# Patient Record
Sex: Female | Born: 1985 | Race: Black or African American | Hispanic: No | Marital: Single | Smoking: Former smoker
Health system: Southern US, Community
[De-identification: ages and names within clinical notes are randomized; demographics above are authoritative.]

## PROBLEM LIST (undated history)

## (undated) DIAGNOSIS — A549 Gonococcal infection, unspecified: Secondary | ICD-10-CM

## (undated) DIAGNOSIS — A749 Chlamydial infection, unspecified: Secondary | ICD-10-CM

## (undated) DIAGNOSIS — K219 Gastro-esophageal reflux disease without esophagitis: Secondary | ICD-10-CM

## (undated) DIAGNOSIS — M329 Systemic lupus erythematosus, unspecified: Secondary | ICD-10-CM

## (undated) DIAGNOSIS — A6000 Herpesviral infection of urogenital system, unspecified: Secondary | ICD-10-CM

## (undated) DIAGNOSIS — M069 Rheumatoid arthritis, unspecified: Secondary | ICD-10-CM

## (undated) DIAGNOSIS — IMO0002 Reserved for concepts with insufficient information to code with codable children: Secondary | ICD-10-CM

## (undated) HISTORY — PX: TONSILLECTOMY: SUR1361

## (undated) HISTORY — PX: DILATION AND CURETTAGE OF UTERUS: SHX78

## (undated) HISTORY — DX: Gastro-esophageal reflux disease without esophagitis: K21.9

## (undated) HISTORY — PX: GALLBLADDER SURGERY: SHX652

## (undated) HISTORY — PX: TUBAL LIGATION: SHX77

## (undated) HISTORY — PX: ADENOIDECTOMY: SUR15

## (undated) HISTORY — PX: CHOLECYSTECTOMY: SHX55

---

## 2010-12-06 DIAGNOSIS — H539 Unspecified visual disturbance: Secondary | ICD-10-CM | POA: Insufficient documentation

## 2011-05-22 ENCOUNTER — Emergency Department (HOSPITAL_BASED_OUTPATIENT_CLINIC_OR_DEPARTMENT_OTHER)
Admission: EM | Admit: 2011-05-22 | Discharge: 2011-05-22 | Disposition: A | Discharge status: Home / Self Care | Payer: Self-pay | Attending: Emergency Medicine | Admitting: Emergency Medicine

## 2011-05-22 ENCOUNTER — Encounter (HOSPITAL_BASED_OUTPATIENT_CLINIC_OR_DEPARTMENT_OTHER): Payer: Self-pay | Admitting: *Deleted

## 2011-05-22 DIAGNOSIS — K5289 Other specified noninfective gastroenteritis and colitis: Secondary | ICD-10-CM | POA: Insufficient documentation

## 2011-05-22 DIAGNOSIS — F17200 Nicotine dependence, unspecified, uncomplicated: Secondary | ICD-10-CM | POA: Insufficient documentation

## 2011-05-22 DIAGNOSIS — K529 Noninfective gastroenteritis and colitis, unspecified: Secondary | ICD-10-CM

## 2011-05-22 HISTORY — DX: Rheumatoid arthritis, unspecified: M06.9

## 2011-05-22 LAB — BASIC METABOLIC PANEL
CO2: 24 mEq/L (ref 19–32)
Calcium: 8.9 mg/dL (ref 8.4–10.5)
Chloride: 104 mEq/L (ref 96–112)
Glucose, Bld: 80 mg/dL (ref 70–99)
Sodium: 138 mEq/L (ref 135–145)

## 2011-05-22 LAB — URINALYSIS, ROUTINE W REFLEX MICROSCOPIC
Glucose, UA: NEGATIVE mg/dL
Hgb urine dipstick: NEGATIVE
Ketones, ur: NEGATIVE mg/dL
Protein, ur: NEGATIVE mg/dL
pH: 7.5 (ref 5.0–8.0)

## 2011-05-22 MED ORDER — ONDANSETRON HCL 4 MG/2ML IJ SOLN
4.0000 mg | Freq: Once | INTRAMUSCULAR | Status: AC
  Administered 2011-05-22: 4 mg via INTRAVENOUS
  Filled 2011-05-22: qty 2

## 2011-05-22 MED ORDER — LOPERAMIDE HCL 2 MG PO CAPS: 2.0000 mg | ORAL_CAPSULE | Freq: Four times a day (QID) | ORAL | Status: AC | PRN

## 2011-05-22 MED ORDER — ONDANSETRON HCL 4 MG PO TABS: 4.0000 mg | ORAL_TABLET | Freq: Four times a day (QID) | ORAL | Status: AC

## 2011-05-22 MED ORDER — DIPHENOXYLATE-ATROPINE 2.5-0.025 MG PO TABS
2.0000 | ORAL_TABLET | Freq: Once | ORAL | Status: AC
  Administered 2011-05-22: 2 via ORAL
  Filled 2011-05-22 (×2): qty 1

## 2011-05-22 MED ORDER — SODIUM CHLORIDE 0.9 % IV BOLUS (SEPSIS)
1000.0000 mL | Freq: Once | INTRAVENOUS | Status: AC
  Administered 2011-05-22: 1000 mL via INTRAVENOUS

## 2011-05-22 NOTE — ED Notes (Signed)
Patient states she has been experiencjng nausea/vomiting since Sat, some diarrhea during that time, took pepto bismol last night, no relief, trying to drink fluids

## 2011-05-22 NOTE — ED Provider Notes (Signed)
History     CSN: [id number]  Arrival date & time 05/22/11  1101   First MD Initiated Contact with Patient 05/22/11 1125      Chief Complaint  Patient presents with  . Emesis     HPI Patient states she has been experiencjng nausea/vomiting since Sat, some diarrhea which she quantifies as 6-8 times per day, watery in nature, during that time, took pepto bismol last night, no relief, trying to drink fluids.  Denies fever.  No significant pain.  Mr. periods been normal.  Past Medical History  Diagnosis Date  . Rheumatoid arthritis     Past Surgical History  Procedure Date  . Tubal ligation   . Tonsillectomy     No family history on file.  History  Substance Use Topics  . Smoking status: Current Some Day Smoker  . Smokeless tobacco: Not on file  . Alcohol Use: No    OB History    Grav Para Term Preterm Abortions TAB SAB Ect Mult Living                  Review of Systems Negative except as noted in history of present illness Allergies  Keflex; Penicillins; and Toradol  Home Medications   Current Outpatient Rx  Name Route Sig Dispense Refill  . LOPERAMIDE HCL 2 MG PO CAPS Oral Take 1 capsule (2 mg total) by mouth 4 (four) times daily as needed for diarrhea or loose stools. 12 capsule 0  . ONDANSETRON HCL 4 MG PO TABS Oral Take 1 tablet (4 mg total) by mouth every 6 (six) hours. 12 tablet 0    BP 129/83  Pulse 83  Temp 98.6 F (37 C)  Resp 18  SpO2 100%  Physical Exam  Nursing note and vitals reviewed. Constitutional: She is oriented to person, place, and time. She appears well-developed and well-nourished. No distress.  HENT:  Head: Normocephalic and atraumatic.  Eyes: Pupils are equal, round, and reactive to light.  Neck: Normal range of motion.  Cardiovascular: Normal rate and intact distal pulses.   Pulmonary/Chest: No respiratory distress.  Abdominal: Soft. Normal appearance and bowel sounds are normal. She exhibits no distension. There is  tenderness. There is no rebound and no guarding.  Musculoskeletal: Normal range of motion.  Neurological: She is alert and oriented to person, place, and time. No cranial nerve deficit.  Skin: Skin is warm and dry. No rash noted.  Psychiatric: She has a normal mood and affect. Her behavior is normal.    ED Course  Procedures (including critical care time)   Labs Reviewed  PREGNANCY, URINE  URINALYSIS, ROUTINE W REFLEX MICROSCOPIC  BASIC METABOLIC PANEL   No results found.   1. Gastroenteritis       MDM          Nelia Shi, MD 05/22/11 [phone]

## 2011-06-12 ENCOUNTER — Encounter (HOSPITAL_BASED_OUTPATIENT_CLINIC_OR_DEPARTMENT_OTHER): Payer: Self-pay | Admitting: *Deleted

## 2011-06-12 ENCOUNTER — Emergency Department (HOSPITAL_BASED_OUTPATIENT_CLINIC_OR_DEPARTMENT_OTHER)
Admission: EM | Admit: 2011-06-12 | Discharge: 2011-06-12 | Disposition: A | Discharge status: Home / Self Care | Payer: Medicaid Other | Attending: Emergency Medicine | Admitting: Emergency Medicine

## 2011-06-12 ENCOUNTER — Emergency Department (INDEPENDENT_AMBULATORY_CARE_PROVIDER_SITE_OTHER): Payer: Medicaid Other

## 2011-06-12 DIAGNOSIS — K8020 Calculus of gallbladder without cholecystitis without obstruction: Secondary | ICD-10-CM | POA: Insufficient documentation

## 2011-06-12 DIAGNOSIS — R109 Unspecified abdominal pain: Secondary | ICD-10-CM | POA: Insufficient documentation

## 2011-06-12 DIAGNOSIS — N83201 Unspecified ovarian cyst, right side: Secondary | ICD-10-CM

## 2011-06-12 DIAGNOSIS — N83209 Unspecified ovarian cyst, unspecified side: Secondary | ICD-10-CM | POA: Insufficient documentation

## 2011-06-12 DIAGNOSIS — R1031 Right lower quadrant pain: Secondary | ICD-10-CM

## 2011-06-12 LAB — URINALYSIS, ROUTINE W REFLEX MICROSCOPIC
Glucose, UA: NEGATIVE mg/dL
Ketones, ur: NEGATIVE mg/dL
Leukocytes, UA: NEGATIVE
Nitrite: NEGATIVE
Specific Gravity, Urine: 1.022 (ref 1.005–1.030)
pH: 6.5 (ref 5.0–8.0)

## 2011-06-12 LAB — PREGNANCY, URINE: Preg Test, Ur: NEGATIVE

## 2011-06-12 LAB — DIFFERENTIAL
Basophils Relative: 1 % (ref 0–1)
Lymphocytes Relative: 56 % — ABNORMAL HIGH (ref 12–46)
Lymphs Abs: 2.9 10*3/uL (ref 0.7–4.0)
Monocytes Absolute: 0.6 10*3/uL (ref 0.1–1.0)
Monocytes Relative: 12 % (ref 3–12)
Neutro Abs: 1.5 10*3/uL — ABNORMAL LOW (ref 1.7–7.7)
Neutrophils Relative %: 28 % — ABNORMAL LOW (ref 43–77)

## 2011-06-12 LAB — COMPREHENSIVE METABOLIC PANEL
Albumin: 3.8 g/dL (ref 3.5–5.2)
Alkaline Phosphatase: 105 U/L (ref 39–117)
BUN: 11 mg/dL (ref 6–23)
CO2: 26 mEq/L (ref 19–32)
Chloride: 103 mEq/L (ref 96–112)
Creatinine, Ser: 0.8 mg/dL (ref 0.50–1.10)
GFR calc non Af Amer: >90 mL/min (ref >90)
Potassium: 4.1 mEq/L (ref 3.5–5.1)
Total Bilirubin: 0.3 mg/dL (ref 0.3–1.2)

## 2011-06-12 LAB — CBC
HCT: 38.9 % (ref 36.0–46.0)
Hemoglobin: 13.1 g/dL (ref 12.0–15.0)
RBC: 4.79 MIL/uL (ref 3.87–5.11)
WBC: 5.3 10*3/uL (ref 4.0–10.5)

## 2011-06-12 LAB — LIPASE, BLOOD: Lipase: 27 U/L (ref 11–59)

## 2011-06-12 MED ORDER — ONDANSETRON HCL 4 MG/2ML IJ SOLN
4.0000 mg | Freq: Once | INTRAMUSCULAR | Status: AC
  Administered 2011-06-12: 4 mg via INTRAVENOUS
  Filled 2011-06-12: qty 2

## 2011-06-12 MED ORDER — MORPHINE SULFATE 4 MG/ML IJ SOLN
4.0000 mg | Freq: Once | INTRAMUSCULAR | Status: AC
  Administered 2011-06-12: 4 mg via INTRAVENOUS
  Filled 2011-06-12: qty 1

## 2011-06-12 MED ORDER — IOHEXOL 300 MG/ML  SOLN
100.0000 mL | Freq: Once | INTRAMUSCULAR | Status: AC | PRN
  Administered 2011-06-12: 100 mL via INTRAVENOUS

## 2011-06-12 MED ORDER — POLYETHYLENE GLYCOL 3350 17 G PO PACK: 17.0000 g | PACK | Freq: Every day | ORAL | Status: AC

## 2011-06-12 MED ORDER — SODIUM CHLORIDE 0.9 % IV BOLUS (SEPSIS)
1000.0000 mL | Freq: Once | INTRAVENOUS | Status: AC
  Administered 2011-06-12: 1000 mL via INTRAVENOUS

## 2011-06-12 MED ORDER — IOHEXOL 300 MG/ML  SOLN: 20.0000 mL | INTRAMUSCULAR | Status: AC

## 2011-06-12 NOTE — Discharge Instructions (Signed)
Abdominal Pain Abdominal pain can be caused by many things. Your caregiver decides the seriousness of your pain by an examination and possibly blood tests and X-rays. Many cases can be observed and treated at home. Most abdominal pain is not caused by a disease and will probably improve without treatment. However, in many cases, more time must pass before a clear cause of the pain can be found. Before that point, it may not be known if you need more testing, or if hospitalization or surgery is needed. HOME CARE INSTRUCTIONS   Do not take laxatives unless directed by your caregiver.   Take pain medicine only as directed by your caregiver.   Only take over-the-counter or prescription medicines for pain, discomfort, or fever as directed by your caregiver.   Try a clear liquid diet (broth, tea, or water) for as long as directed by your caregiver. Slowly move to a bland diet as tolerated.  SEEK IMMEDIATE MEDICAL CARE IF:   The pain does not go away.   You have a fever.   You keep throwing up (vomiting).   The pain is felt only in portions of the abdomen. Pain in the right side could possibly be appendicitis. In an adult, pain in the left lower portion of the abdomen could be colitis or diverticulitis.   You pass bloody or black tarry stools.  MAKE SURE YOU:   Understand these instructions.   Will watch your condition.   Will get help right away if you are not doing well or get worse.  Document Released: 01/23/2005 Document Revised: 12/26/2010 Document Reviewed: 12/02/2007 [facility] Patient Information 2012 [place], [place].   Polyethylene Glycol powder What is this medicine? POLYETHYLENE GLYCOL 3350 (pol ee ETH i leen; GLYE col) powder is a laxative used to treat constipation. It increases the amount of water in the stool. Bowel movements become easier and more frequent. This medicine may be used for other purposes; ask your health care provider or pharmacist if you have questions. What  should I tell my health care provider before I take this medicine? They need to know if you have any of these conditions: -a history of blockage of the stomach or intestine -current abdomen distension or pain -difficulty swallowing -diverticulitis, ulcerative colitis, or other chronic bowel disease -phenylketonuria -an unusual or allergic reaction to polyethylene glycol, other medicines, dyes, or preservatives -pregnant or trying to get pregnant -breast-feeding How should I use this medicine? Take this medicine by mouth. The bottle has a measuring cap that is marked with a line. Pour the powder into the cap up to the marked line (the dose is about 1 heaping tablespoon). Add the powder in the cap to a full glass (4 to 8 ounces or 120 to 240 ml) of water, juice, soda, coffee or tea. Mix the powder well. Drink the solution. Take exactly as directed. Do not take your medicine more often than directed. Talk to your pediatrician regarding the use of this medicine in children. Special care may be needed. Overdosage: If you think you have taken too much of this medicine contact a poison control center or emergency room at once. NOTE: This medicine is only for you. Do not share this medicine with others. What if I miss a dose? If you miss a dose, take it as soon as you can. If it is almost time for your next dose, take only that dose. Do not take double or extra doses. What may interact with this medicine? Interactions are not expected.  This list may not describe all possible interactions. Give your health care provider a list of all the medicines, herbs, non-prescription drugs, or dietary supplements you use. Also tell them if you smoke, drink alcohol, or use illegal drugs. Some items may interact with your medicine. What should I watch for while using this medicine? Do not use for more than 2 weeks without advice from your doctor or health care professional. It can take 2 to 4 days to have a bowel  movement and to experience improvement in constipation. See your health care professional for any changes in bowel habits, including constipation, that are severe or last longer than three weeks. Always take this medicine with plenty of water. What side effects may I notice from receiving this medicine? Side effects that you should report to your doctor or health care professional as soon as possible: -diarrhea -difficulty breathing -itching of the skin, hives, or skin rash -severe bloating, pain, or distension of the stomach -vomiting Side effects that usually do not require medical attention (report to your doctor or health care professional if they continue or are bothersome): -bloating or gas -lower abdominal discomfort or cramps -nausea This list may not describe all possible side effects. Call your doctor for medical advice about side effects. You may report side effects to FDA at 1-800-FDA-1088. Where should I keep my medicine? Keep out of the reach of children. Store between 15 and 30 degrees C (59 and 86 degrees F). Throw away any unused medicine after the expiration date. NOTE: This sheet is a summary. It may not cover all possible information. If you have questions about this medicine, talk to your doctor, pharmacist, or health care provider.  2012, [employer]. (11/16/2007 4:50:45 PM)

## 2011-06-12 NOTE — ED Notes (Signed)
Patient states that she is having R side abd pain since yesterday, no v/d, sharp stabbing pain

## 2011-06-12 NOTE — ED Notes (Signed)
Patient states that she started having R sided abd pain yesterday, no n/v last lmp edn of January, alco c/o boil on buttocks

## 2011-06-12 NOTE — ED Provider Notes (Signed)
History     CSN: [id number]  Arrival date & time 06/12/11  1308   First MD Initiated Contact with Patient 06/12/11 [phone]      Chief Complaint  Patient presents with  . Abdominal Pain   patient began having abdominal pain yesterday in the right side. She states it became worse this morning. It is sharp and stabbing and constant. No specific alleviating or aggravating factors other than tenderness to palpation. No nausea, vomiting, no vaginal bleeding or discharge. Patient states that she's not pregnant, and had a normal menstrual cycle at the end of last month, although slightly heavier. She's had no fever. No back pain, denies any dizziness or syncope  (Consider location/radiation/quality/duration/timing/severity/associated sxs/prior treatment) HPI  Past Medical History  Diagnosis Date  . Rheumatoid arthritis     Past Surgical History  Procedure Date  . Tubal ligation   . Tonsillectomy     History reviewed. No pertinent family history.  History  Substance Use Topics  . Smoking status: Current Some Day Smoker  . Smokeless tobacco: Not on file  . Alcohol Use: No    OB History    Grav Para Term Preterm Abortions TAB SAB Ect Mult Living                  Review of Systems  All other systems reviewed and are negative.    Allergies  Keflex; Penicillins; and Toradol  Home Medications  No current outpatient prescriptions on file.  BP 124/85  Pulse 87  Temp(Src) 98.7 F (37.1 C) (Oral)  Resp 16  Ht 5\' 5"  (1.651 m)  Wt 210 lb (95.255 kg)  BMI 34.95 kg/m2  SpO2 100%  Physical Exam  Nursing note and vitals reviewed. Constitutional: She is oriented to person, place, and time. She appears well-developed and well-nourished.  HENT:  Head: Normocephalic and atraumatic.  Eyes: Conjunctivae and EOM are normal. Pupils are equal, round, and reactive to light.  Neck: Neck supple.  Cardiovascular: Normal rate and regular rhythm.  Exam reveals no gallop and no friction  rub.   No murmur heard. Pulmonary/Chest: Breath sounds normal. She has no wheezes. She has no rales. She exhibits no tenderness.  Abdominal: Soft. Bowel sounds are normal. She exhibits no distension. There is tenderness. There is no rebound and no guarding.       Diffuse tenderness, right side of abdomen to deep palpation. No rebound, rigidity or guarding  Musculoskeletal: Normal range of motion.  Neurological: She is alert and oriented to person, place, and time. No cranial nerve deficit. Coordination normal.  Skin: Skin is warm and dry. No rash noted.  Psychiatric: She has a normal mood and affect.    ED Course  Procedures (including critical care time)   Labs Reviewed  PREGNANCY, URINE  URINALYSIS, ROUTINE W REFLEX MICROSCOPIC  CBC  DIFFERENTIAL  COMPREHENSIVE METABOLIC PANEL  LIPASE, BLOOD   No results found.   No diagnosis found.    MDM  Patient is seen and examined, initial history and physical is completed. Evaluation initiated      Results for orders placed during the hospital encounter of 06/12/11  PREGNANCY, URINE      Component Value Range   Preg Test, Ur NEGATIVE  NEGATIVE   URINALYSIS, ROUTINE W REFLEX MICROSCOPIC      Component Value Range   Color, Urine YELLOW  YELLOW    APPearance CLEAR  CLEAR    Specific Gravity, Urine 1.022  1.005 - 1.030  pH 6.5  5.0 - 8.0    Glucose, UA NEGATIVE  NEGATIVE (mg/dL)   Hgb urine dipstick NEGATIVE  NEGATIVE    Bilirubin Urine NEGATIVE  NEGATIVE    Ketones, ur NEGATIVE  NEGATIVE (mg/dL)   Protein, ur NEGATIVE  NEGATIVE (mg/dL)   Urobilinogen, UA 0.2  0.0 - 1.0 (mg/dL)   Nitrite NEGATIVE  NEGATIVE    Leukocytes, UA NEGATIVE  NEGATIVE   CBC      Component Value Range   WBC 5.3  4.0 - 10.5 (K/uL)   RBC 4.79  3.87 - 5.11 (MIL/uL)   Hemoglobin 13.1  12.0 - 15.0 (g/dL)   HCT 16.1  09.6 - 04.5 (%)   MCV 81.2  78.0 - 100.0 (fL)   MCH 27.3  26.0 - 34.0 (pg)   MCHC 33.7  30.0 - 36.0 (g/dL)   RDW 40.9  81.1 - 91.4  (%)   Platelets 244  150 - 400 (K/uL)  DIFFERENTIAL      Component Value Range   Neutrophils Relative 28 (*) 43 - 77 (%)   Neutro Abs 1.5 (*) 1.7 - 7.7 (K/uL)   Lymphocytes Relative 56 (*) 12 - 46 (%)   Lymphs Abs 2.9  0.7 - 4.0 (K/uL)   Monocytes Relative 12  3 - 12 (%)   Monocytes Absolute 0.6  0.1 - 1.0 (K/uL)   Eosinophils Relative 4  0 - 5 (%)   Eosinophils Absolute 0.2  0.0 - 0.7 (K/uL)   Basophils Relative 1  0 - 1 (%)   Basophils Absolute 0.0  0.0 - 0.1 (K/uL)  COMPREHENSIVE METABOLIC PANEL      Component Value Range   Sodium 137  135 - 145 (mEq/L)   Potassium 4.1  3.5 - 5.1 (mEq/L)   Chloride 103  96 - 112 (mEq/L)   CO2 26  19 - 32 (mEq/L)   Glucose, Bld 80  70 - 99 (mg/dL)   BUN 11  6 - 23 (mg/dL)   Creatinine, Ser 7.82  0.50 - 1.10 (mg/dL)   Calcium 9.3  8.4 - 95.6 (mg/dL)   Total Protein 7.8  6.0 - 8.3 (g/dL)   Albumin 3.8  3.5 - 5.2 (g/dL)   AST 19  0 - 37 (U/L)   ALT 20  0 - 35 (U/L)   Alkaline Phosphatase 105  39 - 117 (U/L)   Total Bilirubin 0.3  0.3 - 1.2 (mg/dL)   GFR calc non Af Amer >90  >90 (mL/min)   GFR calc Af Amer >90  >90 (mL/min)  LIPASE, BLOOD      Component Value Range   Lipase 27  11 - 59 (U/L)   No results found.  The patient was reassessed, stable. Blood tests all normal. White count was normal. Liver function tests were normal. Lipase was normal. Urine was normal. Pregnancy, negative    CT scan discussed with patient. There is no acute findings in the abdomen or pelvis. There was some mention of cholelithiasis. No visible wall thickening. No free fluid. Appendix was visualized and was normal. 0.5 cm cyst in right ovary and some slight stranding in the right medial buttock, but no focal fluid collection to suggest abscess    Patient was given. All this information and was told that she could likely be discharged home. We will repeat her pain medications and give additional IV fluids and reassessed.   1:39 PM  The patient is feeling  better at this time. Discussion with patient.  Patient does state that she takes Vicodin on a chronic basis. She also states that she has chronic constipation. We'll recommend the patient discontinue her, Vicodin. We'll prescribe MiraLAX. She was told to take clear liquids were very bland diet for next 24-48 hours. Return to ER for any concerns and followup with primary physician  Theron Arista A. Patrica Duel, MD 06/12/11 1339

## 2011-07-09 ENCOUNTER — Emergency Department (HOSPITAL_BASED_OUTPATIENT_CLINIC_OR_DEPARTMENT_OTHER)
Admission: EM | Admit: 2011-07-09 | Discharge: 2011-07-09 | Disposition: A | Discharge status: Home / Self Care | Payer: Medicaid Other | Attending: Emergency Medicine | Admitting: Emergency Medicine

## 2011-07-09 ENCOUNTER — Encounter (HOSPITAL_BASED_OUTPATIENT_CLINIC_OR_DEPARTMENT_OTHER): Payer: Self-pay | Admitting: *Deleted

## 2011-07-09 DIAGNOSIS — F17200 Nicotine dependence, unspecified, uncomplicated: Secondary | ICD-10-CM | POA: Insufficient documentation

## 2011-07-09 DIAGNOSIS — L089 Local infection of the skin and subcutaneous tissue, unspecified: Secondary | ICD-10-CM | POA: Insufficient documentation

## 2011-07-09 DIAGNOSIS — M069 Rheumatoid arthritis, unspecified: Secondary | ICD-10-CM | POA: Insufficient documentation

## 2011-07-09 MED ORDER — HYDROCODONE-ACETAMINOPHEN 5-325 MG PO TABS: 2.0000 | ORAL_TABLET | ORAL | Status: AC | PRN

## 2011-07-09 MED ORDER — DOXYCYCLINE HYCLATE 100 MG PO CAPS: 100.0000 mg | ORAL_CAPSULE | Freq: Two times a day (BID) | ORAL | Status: AC

## 2011-07-09 NOTE — Discharge Instructions (Signed)
Skin Infections A skin infection usually develops as a result of disruption of the skin barrier.  CAUSES  A skin infection might occur following:  Trauma or an injury to the skin such as a cut or insect sting.   Inflammation (as in eczema).   Breaks in the skin between the toes (as in athlete's foot).   Swelling (edema).  SYMPTOMS  The legs are the most common site affected. Usually there is:  Redness.   Swelling.   Pain.   There may be red streaks in the area of the infection.  TREATMENT   Minor skin infections may be treated with topical antibiotics, but if the skin infection is severe, hospital care and intravenous (IV) antibiotic treatment may be needed.   Most often skin infections can be treated with oral antibiotic medicine as well as proper rest and elevation of the affected area until the infection improves.   If you are prescribed oral antibiotics, it is important to take them as directed and to take all the pills even if you feel better before you have finished all of the medicine.   You may apply warm compresses to the area for 20-30 minutes 4 times daily.  You might need a tetanus shot now if:  You have no idea when you had the last one.   You have never had a tetanus shot before.   Your wound had dirt in it.  If you need a tetanus shot and you decide not to get one, there is a rare chance of getting tetanus. Sickness from tetanus can be serious. If you get a tetanus shot, your arm may swell and become red and warm at the shot site. This is common and not a problem. SEEK MEDICAL CARE IF:  The pain and swelling from your infection do not improve within 2 days.  SEEK IMMEDIATE MEDICAL CARE IF:  You develop a fever, chills, or other serious problems.  Document Released: 05/23/2004 Document Revised: 04/04/2011 Document Reviewed: 04/04/2008 [employer] Patient Information 2012 [employer]. 

## 2011-07-09 NOTE — ED Provider Notes (Signed)
History     CSN: [id number]  Arrival date & time 07/09/11  1112   First MD Initiated Contact with Patient 07/09/11 1214      Chief Complaint  Patient presents with  . Abscess    (Consider location/radiation/quality/duration/timing/severity/associated sxs/prior treatment) Patient is a 26 y.o. female presenting with abscess. The history is provided by the patient. No language interpreter was used.  Abscess  This is a new problem. The current episode started yesterday. The problem occurs continuously. The problem has been gradually worsening. The abscess is present on the right buttock. The abscess is characterized by painfulness and redness. The abscess first occurred at home. Pertinent negatives include no fever. There were no sick contacts.  Pt complains of a swollen painful area buttock.   Past Medical History  Diagnosis Date  . Rheumatoid arthritis     Past Surgical History  Procedure Date  . Tubal ligation   . Tonsillectomy     No family history on file.  History  Substance Use Topics  . Smoking status: Current Some Day Smoker  . Smokeless tobacco: Not on file  . Alcohol Use: No    OB History    Grav Para Term Preterm Abortions TAB SAB Ect Mult Living                  Review of Systems  Constitutional: Negative for fever.  Skin: Negative for color change and wound.  All other systems reviewed and are negative.    Allergies  Keflex; Penicillins; and Toradol  Home Medications   Current Outpatient Rx  Name Route Sig Dispense Refill  . DOXYCYCLINE HYCLATE 100 MG PO CAPS Oral Take 1 capsule (100 mg total) by mouth 2 (two) times daily. 20 capsule 0  . HYDROCODONE-ACETAMINOPHEN 5-325 MG PO TABS Oral Take 2 tablets by mouth every 4 (four) hours as needed for pain. 10 tablet 0    BP 140/86  Pulse 78  Temp 98.4 F (36.9 C)  Resp 20  Ht 5\' 5"  (1.651 m)  Wt 205 lb (92.987 kg)  BMI 34.11 kg/m2  SpO2 100%  LMP 06/20/2011  Physical Exam  Nursing note  and vitals reviewed. Constitutional: She is oriented to person, place, and time. She appears well-developed and well-nourished.  HENT:  Head: Normocephalic.  Musculoskeletal: Normal range of motion.  Neurological: She is alert and oriented to person, place, and time.  Skin: There is erythema.  Psychiatric: She has a normal mood and affect.  tender area buttock,  No obvious area of fluctuance  ED Course  Procedures (including critical care time)  Labs Reviewed - No data to display No results found.   1. Skin infection       MDM  Pt counseled on skin infection.   I advised follow up here in 2 days.    Medical screening examination/treatment/procedure(s) were performed by non-physician practitioner and as supervising physician I was immediately available for consultation/collaboration. Osvaldo Human, M.D.      Lonia Skinner [place], [place] 07/09/11 1322  Carleene Cooper III, MD 07/09/11 2121

## 2011-07-09 NOTE — ED Notes (Signed)
Pt informed Weston Brass, EMT that she was leaving ED.  She left prior to being seen by ED physician.  Pt left without signing AMA form.

## 2011-07-09 NOTE — ED Notes (Signed)
Abscess in the crease of her buttocks.  Painful for the last 2 days.

## 2011-07-27 ENCOUNTER — Emergency Department (HOSPITAL_BASED_OUTPATIENT_CLINIC_OR_DEPARTMENT_OTHER)
Admission: EM | Admit: 2011-07-27 | Discharge: 2011-07-27 | Disposition: A | Discharge status: Home / Self Care | Payer: Medicaid Other | Attending: Emergency Medicine | Admitting: Emergency Medicine

## 2011-07-27 ENCOUNTER — Encounter (HOSPITAL_BASED_OUTPATIENT_CLINIC_OR_DEPARTMENT_OTHER): Payer: Self-pay | Admitting: *Deleted

## 2011-07-27 DIAGNOSIS — L02219 Cutaneous abscess of trunk, unspecified: Secondary | ICD-10-CM | POA: Insufficient documentation

## 2011-07-27 DIAGNOSIS — M069 Rheumatoid arthritis, unspecified: Secondary | ICD-10-CM | POA: Insufficient documentation

## 2011-07-27 DIAGNOSIS — N898 Other specified noninflammatory disorders of vagina: Secondary | ICD-10-CM | POA: Insufficient documentation

## 2011-07-27 DIAGNOSIS — L0291 Cutaneous abscess, unspecified: Secondary | ICD-10-CM

## 2011-07-27 DIAGNOSIS — N949 Unspecified condition associated with female genital organs and menstrual cycle: Secondary | ICD-10-CM | POA: Insufficient documentation

## 2011-07-27 DIAGNOSIS — L293 Anogenital pruritus, unspecified: Secondary | ICD-10-CM | POA: Insufficient documentation

## 2011-07-27 LAB — WET PREP, GENITAL

## 2011-07-27 LAB — PREGNANCY, URINE: Preg Test, Ur: NEGATIVE

## 2011-07-27 MED ORDER — SULFAMETHOXAZOLE-TRIMETHOPRIM 800-160 MG PO TABS: 1.0000 | ORAL_TABLET | Freq: Two times a day (BID) | ORAL | Status: AC

## 2011-07-27 NOTE — Discharge Instructions (Signed)
Discontinue Monistat. Keep the area around your vagina clean and dry it thoroughly after bathing. Take Benadryl 50 milligrams 4 times daily as needed for itch. No driving while taking Benadryl as it may cause drowsiness. Take the antibiotic as prescribed. See your doctor if not feeling better by next week. Return if your condition worsens for any reason

## 2011-07-27 NOTE — ED Provider Notes (Addendum)
This chart was scribed for Doug Sou, MD by Williemae Natter. The patient was seen in room MH07/[facility]07 at 4:52 PM.  CSN: [id number]  Arrival date & time 07/27/11  1537   First MD Initiated Contact with Patient 07/27/11 1629      Chief Complaint  Patient presents with  . Recurrent Skin Infections    (Consider location/radiation/quality/duration/timing/severity/associated sxs/prior treatment) HPI Rhonda Mcgee is a 26 y.o. female who presents to the Emergency Department complaining of boils on her vaginal area. Acute onset of mild to moderate symptoms. Pt has a hx of boils and was seen here here for a boil on her gluteal area 3 weeks ago. Pt now has 2 boils on her vaginal area. Reports that "boils" busted a few days ago, releasing white pus and blood after busting. Now complains of itching at external genitalia and pain where they "busted Pt reports being itchy in that area. Treated with monostat topical cream with little relief. No vaginal discharge. No major health issues. Pt does not smoke or drink. Pt is allergic to penicillin, Torodol, and Flagyl cream. No abdominal pain. Nothing makes symptoms better or worse . No fever no abdominal pain. No dysuria no other associated symptoms  Past Medical History  Diagnosis Date  . Rheumatoid arthritis     Past Surgical History  Procedure Date  . Tubal ligation   . Tonsillectomy     History reviewed. No pertinent family history.  History  Substance Use Topics  . Smoking status: Current Some Day Smoker  . Smokeless tobacco: Not on file  . Alcohol Use: No    OB History    Grav Para Term Preterm Abortions TAB SAB Ect Mult Living                  Review of Systems  Constitutional: Negative.   HENT: Negative.   Respiratory: Negative.   Cardiovascular: Negative.   Gastrointestinal: Negative.   Genitourinary: Positive for vaginal pain. Negative for vaginal discharge.       Abnormal smell  Musculoskeletal: Negative.   Skin:  Negative.   Neurological: Negative.   Hematological: Negative.   Psychiatric/Behavioral: Negative.   All other systems reviewed and are negative.    Allergies  Keflex; Penicillins; and Toradol  Home Medications  No current outpatient prescriptions on file.  BP 133/78  Pulse 86  Temp(Src) 98.2 F (36.8 C) (Oral)  Resp 20  Ht 5\' 5"  (1.651 m)  Wt 205 lb (92.987 kg)  BMI 34.11 kg/m2  SpO2 100%  LMP 07/18/2011  Physical Exam  Nursing note and vitals reviewed. Constitutional: She appears well-developed and well-nourished.  HENT:  Head: Normocephalic and atraumatic.  Eyes: Conjunctivae are normal. Pupils are equal, round, and reactive to light.  Neck: Neck supple. No tracheal deviation present. No thyromegaly present.  Cardiovascular: Normal rate and regular rhythm.   No murmur heard. Pulmonary/Chest: Effort normal and breath sounds normal.  Abdominal: Soft. Bowel sounds are normal. She exhibits no distension. There is no tenderness.       Obese  Genitourinary:       Labia majora raw appearing, moist. No lo discreet lesion, no ulcer. Slight amount whitish vaginal discharge cervical os closed no cervical motion tenderness no adnexal masses or tenderness  Musculoskeletal: Normal range of motion. She exhibits no edema and no tenderness.  Neurological: She is alert. Coordination normal.  Skin: Skin is warm and dry. No rash noted.  Psychiatric: She has a normal mood and affect.  ED Course  Procedures (including critical care time) DIAGNOSTIC STUDIES: Oxygen Saturation is 100% on room air, normal by my interpretation.    COORDINATION OF CARE: Medications - No data to display     Labs Reviewed  PREGNANCY, URINE   No results found.   No diagnosis found. Results for orders placed during the hospital encounter of 07/27/11  PREGNANCY, URINE      Component Value Range   Preg Test, Ur NEGATIVE  NEGATIVE   WET PREP, GENITAL      Component Value Range   Yeast Wet Prep  HPF POC NONE SEEN  NONE SEEN    Trich, Wet Prep NONE SEEN  NONE SEEN    Clue Cells Wet Prep HPF POC FEW (*) NONE SEEN    WBC, Wet Prep HPF POC FEW (*) NONE SEEN    No results found.   There is currently no visible or palpable abscess and perineal area MDM  Patient reports had abscess on buttock a few weeks ago, and a few weeks prior that had an abscess on her thigh In light of multiple abscesses will treat prophylactically with an antibiotic For itching and perineal area she is encouraged to keep area dry Stop Monistat Benadryl as needed for itch Prescription Bactrim DS Followup with PMD if not improved by next week Diagnosis abscess-healing  I personally performed the services described in this documentation, which was scribed in my presence. The recorded information has been reviewed and considered.        Doug Sou, MD 07/27/11 Elfredia Nevins  Doug Sou, MD 07/28/11 [phone]

## 2011-07-27 NOTE — ED Notes (Signed)
Pt states she was seen here a couple weeks ago for a boil on her buttocks. Presents today with one on her vaginal area.

## 2011-07-29 LAB — GC/CHLAMYDIA PROBE AMP, GENITAL: Chlamydia, DNA Probe: NEGATIVE

## 2011-09-27 ENCOUNTER — Emergency Department (HOSPITAL_BASED_OUTPATIENT_CLINIC_OR_DEPARTMENT_OTHER)
Admission: EM | Admit: 2011-09-27 | Discharge: 2011-09-27 | Disposition: A | Discharge status: Home / Self Care | Payer: Medicaid Other | Attending: Emergency Medicine | Admitting: Emergency Medicine

## 2011-09-27 ENCOUNTER — Encounter (HOSPITAL_BASED_OUTPATIENT_CLINIC_OR_DEPARTMENT_OTHER): Payer: Self-pay

## 2011-09-27 DIAGNOSIS — B9689 Other specified bacterial agents as the cause of diseases classified elsewhere: Secondary | ICD-10-CM | POA: Insufficient documentation

## 2011-09-27 DIAGNOSIS — F17200 Nicotine dependence, unspecified, uncomplicated: Secondary | ICD-10-CM | POA: Insufficient documentation

## 2011-09-27 DIAGNOSIS — A499 Bacterial infection, unspecified: Secondary | ICD-10-CM | POA: Insufficient documentation

## 2011-09-27 DIAGNOSIS — M069 Rheumatoid arthritis, unspecified: Secondary | ICD-10-CM | POA: Insufficient documentation

## 2011-09-27 DIAGNOSIS — N760 Acute vaginitis: Secondary | ICD-10-CM | POA: Insufficient documentation

## 2011-09-27 LAB — URINALYSIS, ROUTINE W REFLEX MICROSCOPIC
Bilirubin Urine: NEGATIVE
Glucose, UA: NEGATIVE mg/dL
Ketones, ur: NEGATIVE mg/dL
Nitrite: NEGATIVE
Protein, ur: NEGATIVE mg/dL
Specific Gravity, Urine: 1.021 (ref 1.005–1.030)
Urobilinogen, UA: 0.2 mg/dL (ref 0.0–1.0)
pH: 6 (ref 5.0–8.0)

## 2011-09-27 LAB — URINE MICROSCOPIC-ADD ON

## 2011-09-27 LAB — WET PREP, GENITAL: Trich, Wet Prep: NONE SEEN

## 2011-09-27 LAB — PREGNANCY, URINE: Preg Test, Ur: NEGATIVE

## 2011-09-27 MED ORDER — METRONIDAZOLE 500 MG PO TABS: 500.0000 mg | ORAL_TABLET | Freq: Two times a day (BID) | ORAL | Status: DC

## 2011-09-27 MED ORDER — METRONIDAZOLE 500 MG PO TABS: 500.0000 mg | ORAL_TABLET | Freq: Two times a day (BID) | ORAL | Status: AC

## 2011-09-27 NOTE — ED Provider Notes (Signed)
Medical screening examination/treatment/procedure(s) were performed by non-physician practitioner and as supervising physician I was immediately available for consultation/collaboration.  Tenea Sens K Linker, MD 09/27/11 1925 

## 2011-09-27 NOTE — ED Notes (Signed)
Vaginal itching and burning x 2 weeks unrelieved after taking OTC medications.

## 2011-09-27 NOTE — Discharge Instructions (Signed)
Bacterial Vaginosis Bacterial vaginosis (BV) is a vaginal infection where the normal balance of bacteria in the vagina is disrupted. The normal balance is then replaced by an overgrowth of certain bacteria. There are several different kinds of bacteria that can cause BV. BV is the most common vaginal infection in women of childbearing age. CAUSES   The cause of BV is not fully understood. BV develops when there is an increase or imbalance of harmful bacteria.   Some activities or behaviors can upset the normal balance of bacteria in the vagina and put women at increased risk including:   Having a new sex partner or multiple sex partners.   Douching.   Using an intrauterine device (IUD) for contraception.   It is not clear what role sexual activity plays in the development of BV. However, women that have never had sexual intercourse are rarely infected with BV.  Women do not get BV from toilet seats, bedding, swimming pools or from touching objects around them.  SYMPTOMS   Grey vaginal discharge.   A fish-like odor with discharge, especially after sexual intercourse.   Itching or burning of the vagina and vulva.   Burning or pain with urination.   Some women have no signs or symptoms at all.  DIAGNOSIS  Your caregiver must examine the vagina for signs of BV. Your caregiver will perform lab tests and look at the sample of vaginal fluid through a microscope. They will look for bacteria and abnormal cells (clue cells), a pH test higher than 4.5, and a positive amine test all associated with BV.  RISKS AND COMPLICATIONS   Pelvic inflammatory disease (PID).   Infections following gynecology surgery.   Developing HIV.   Developing herpes virus.  TREATMENT  Sometimes BV will clear up without treatment. However, all women with symptoms of BV should be treated to avoid complications, especially if gynecology surgery is planned. Female partners generally do not need to be treated. However,  BV may spread between female sex partners so treatment is helpful in preventing a recurrence of BV.   BV may be treated with antibiotics. The antibiotics come in either pill or vaginal cream forms. Either can be used with nonpregnant or pregnant women, but the recommended dosages differ. These antibiotics are not harmful to the baby.   BV can recur after treatment. If this happens, a second round of antibiotics will often be prescribed.   Treatment is important for pregnant women. If not treated, BV can cause a premature delivery, especially for a pregnant woman who had a premature birth in the past. All pregnant women who have symptoms of BV should be checked and treated.   For chronic reoccurrence of BV, treatment with a type of prescribed gel vaginally twice a week is helpful.  HOME CARE INSTRUCTIONS   Finish all medication as directed by your caregiver.   Do not have sex until treatment is completed.   Tell your sexual partner that you have a vaginal infection. They should see their caregiver and be treated if they have problems, such as a mild rash or itching.   Practice safe sex. Use condoms. Only have 1 sex partner.  PREVENTION  Basic prevention steps can help reduce the risk of upsetting the natural balance of bacteria in the vagina and developing BV:  Do not have sexual intercourse (be abstinent).   Do not douche.   Use all of the medicine prescribed for treatment of BV, even if the signs and symptoms go away.     Tell your sex partner if you have BV. That way, they can be treated, if needed, to prevent reoccurrence.  SEEK MEDICAL CARE IF:   Your symptoms are not improving after 3 days of treatment.   You have increased discharge, pain, or fever.  MAKE SURE YOU:   Understand these instructions.   Will watch your condition.   Will get help right away if you are not doing well or get worse.  FOR MORE INFORMATION  Division of STD Prevention (DSTDP), Centers for Disease  Control and Prevention: www.cdc.gov/std American Social Health Association (ASHA): www.ashastd.org  Document Released: 04/15/2005 Document Revised: 04/04/2011 Document Reviewed: 10/06/2008 ExitCare Patient Information 2012 [employer].Bacterial Vaginosis Bacterial vaginosis (BV) is a vaginal infection where the normal balance of bacteria in the vagina is disrupted. The normal balance is then replaced by an overgrowth of certain bacteria. There are several different kinds of bacteria that can cause BV. BV is the most common vaginal infection in women of childbearing age. CAUSES   The cause of BV is not fully understood. BV develops when there is an increase or imbalance of harmful bacteria.   Some activities or behaviors can upset the normal balance of bacteria in the vagina and put women at increased risk including:   Having a new sex partner or multiple sex partners.   Douching.   Using an intrauterine device (IUD) for contraception.   It is not clear what role sexual activity plays in the development of BV. However, women that have never had sexual intercourse are rarely infected with BV.  Women do not get BV from toilet seats, bedding, swimming pools or from touching objects around them.  SYMPTOMS   Grey vaginal discharge.   A fish-like odor with discharge, especially after sexual intercourse.   Itching or burning of the vagina and vulva.   Burning or pain with urination.   Some women have no signs or symptoms at all.  DIAGNOSIS  Your caregiver must examine the vagina for signs of BV. Your caregiver will perform lab tests and look at the sample of vaginal fluid through a microscope. They will look for bacteria and abnormal cells (clue cells), a pH test higher than 4.5, and a positive amine test all associated with BV.  RISKS AND COMPLICATIONS   Pelvic inflammatory disease (PID).   Infections following gynecology surgery.   Developing HIV.   Developing herpes virus.    TREATMENT  Sometimes BV will clear up without treatment. However, all women with symptoms of BV should be treated to avoid complications, especially if gynecology surgery is planned. Female partners generally do not need to be treated. However, BV may spread between female sex partners so treatment is helpful in preventing a recurrence of BV.   BV may be treated with antibiotics. The antibiotics come in either pill or vaginal cream forms. Either can be used with nonpregnant or pregnant women, but the recommended dosages differ. These antibiotics are not harmful to the baby.   BV can recur after treatment. If this happens, a second round of antibiotics will often be prescribed.   Treatment is important for pregnant women. If not treated, BV can cause a premature delivery, especially for a pregnant woman who had a premature birth in the past. All pregnant women who have symptoms of BV should be checked and treated.   For chronic reoccurrence of BV, treatment with a type of prescribed gel vaginally twice a week is helpful.  HOME CARE INSTRUCTIONS   Finish all   medication as directed by your caregiver.   Do not have sex until treatment is completed.   Tell your sexual partner that you have a vaginal infection. They should see their caregiver and be treated if they have problems, such as a mild rash or itching.   Practice safe sex. Use condoms. Only have 1 sex partner.  PREVENTION  Basic prevention steps can help reduce the risk of upsetting the natural balance of bacteria in the vagina and developing BV:  Do not have sexual intercourse (be abstinent).   Do not douche.   Use all of the medicine prescribed for treatment of BV, even if the signs and symptoms go away.   Tell your sex partner if you have BV. That way, they can be treated, if needed, to prevent reoccurrence.  SEEK MEDICAL CARE IF:   Your symptoms are not improving after 3 days of treatment.   You have increased discharge, pain,  or fever.  MAKE SURE YOU:   Understand these instructions.   Will watch your condition.   Will get help right away if you are not doing well or get worse.  FOR MORE INFORMATION  Division of STD Prevention (DSTDP), Centers for Disease Control and Prevention: www.cdc.gov/std American Social Health Association (ASHA): www.ashastd.org  Document Released: 04/15/2005 Document Revised: 04/04/2011 Document Reviewed: 10/06/2008 ExitCare Patient Information 2012 [employer]. 

## 2011-09-27 NOTE — ED Provider Notes (Signed)
History     CSN: [id number]  Arrival date & time 09/27/11  1347   First MD Initiated Contact with Patient 09/27/11 1417      Chief Complaint  Patient presents with  . Vaginal Itching    (Consider location/radiation/quality/duration/timing/severity/associated sxs/prior treatment) Patient is a 26 y.o. female presenting with vaginal discharge. The history is provided by the patient. No language interpreter was used.  Vaginal Discharge This is a new problem. The current episode started today. The problem occurs constantly. The problem has been gradually worsening. The symptoms are aggravated by nothing. She has tried nothing for the symptoms. The treatment provided no relief.  Pt complains of vaginal discharge.   Pt reports unprotected intercourse.   Pt complains of vaginal itching  Past Medical History  Diagnosis Date  . Rheumatoid arthritis     Past Surgical History  Procedure Date  . Tubal ligation   . Tonsillectomy     No family history on file.  History  Substance Use Topics  . Smoking status: Current Some Day Smoker  . Smokeless tobacco: Not on file  . Alcohol Use: No    OB History    Grav Para Term Preterm Abortions TAB SAB Ect Mult Living                  Review of Systems  Genitourinary: Positive for vaginal discharge.  All other systems reviewed and are negative.    Allergies  Cephalexin; Ketorolac tromethamine; and Penicillins  Home Medications  No current outpatient prescriptions on file.  BP 130/93  Pulse 94  Temp(Src) 98.3 F (36.8 C) (Oral)  Resp 16  Ht 5\' 6"  (1.676 m)  Wt 210 lb (95.255 kg)  BMI 33.89 kg/m2  SpO2 100%  LMP 09/27/2011  Physical Exam  Nursing note and vitals reviewed. Constitutional: She is oriented to person, place, and time. She appears well-developed and well-nourished.  HENT:  Head: Normocephalic.  Neck: Normal range of motion.  Cardiovascular: Normal rate and normal heart sounds.   Pulmonary/Chest: Effort  normal.  Abdominal: Soft.  Genitourinary: Vaginal discharge found.       Min bleeding,  Scant discharge  Musculoskeletal: Normal range of motion.  Neurological: She is alert and oriented to person, place, and time. She has normal reflexes.  Skin: Skin is warm.  Psychiatric: She has a normal mood and affect.    ED Course  Procedures (including critical care time)  Labs Reviewed  URINALYSIS, ROUTINE W REFLEX MICROSCOPIC - Abnormal; Notable for the following:    Hgb urine dipstick SMALL (*)    Leukocytes, UA SMALL (*)    All other components within normal limits  PREGNANCY, URINE  URINE MICROSCOPIC-ADD ON   No results found.   1. Bacterial vaginitis       MDM   Results for orders placed during the hospital encounter of 09/27/11  PREGNANCY, URINE      Component Value Range   Preg Test, Ur NEGATIVE  NEGATIVE   URINALYSIS, ROUTINE W REFLEX MICROSCOPIC      Component Value Range   Color, Urine YELLOW  YELLOW    APPearance CLEAR  CLEAR    Specific Gravity, Urine 1.021  1.005 - 1.030    pH 6.0  5.0 - 8.0    Glucose, UA NEGATIVE  NEGATIVE (mg/dL)   Hgb urine dipstick SMALL (*) NEGATIVE    Bilirubin Urine NEGATIVE  NEGATIVE    Ketones, ur NEGATIVE  NEGATIVE (mg/dL)   Protein, ur NEGATIVE  NEGATIVE (mg/dL)   Urobilinogen, UA 0.2  0.0 - 1.0 (mg/dL)   Nitrite NEGATIVE  NEGATIVE    Leukocytes, UA SMALL (*) NEGATIVE   URINE MICROSCOPIC-ADD ON      Component Value Range   Squamous Epithelial / LPF RARE  RARE    WBC, UA 3-6  <3 (WBC/hpf)   RBC / HPF 3-6  <3 (RBC/hpf)   Bacteria, UA RARE  RARE   WET PREP, GENITAL      Component Value Range   Yeast Wet Prep HPF POC NONE SEEN  NONE SEEN    Trich, Wet Prep NONE SEEN  NONE SEEN    Clue Cells Wet Prep HPF POC FEW (*) NONE SEEN    WBC, Wet Prep HPF POC FEW (*) NONE SEEN    No results found.   I will treat for [facility].   Cultures pending.      Lonia Skinner [place], [place] 09/27/11 1544  Lonia Skinner [place], [place] 09/27/11 [phone]

## 2011-09-30 LAB — GC/CHLAMYDIA PROBE AMP, GENITAL
Chlamydia, DNA Probe: NEGATIVE
GC Probe Amp, Genital: NEGATIVE

## 2012-07-04 ENCOUNTER — Encounter (HOSPITAL_BASED_OUTPATIENT_CLINIC_OR_DEPARTMENT_OTHER): Payer: Self-pay | Admitting: *Deleted

## 2012-07-04 ENCOUNTER — Emergency Department (HOSPITAL_BASED_OUTPATIENT_CLINIC_OR_DEPARTMENT_OTHER)
Admission: EM | Admit: 2012-07-04 | Discharge: 2012-07-04 | Disposition: A | Discharge status: Home / Self Care | Payer: Medicaid Other | Attending: Emergency Medicine | Admitting: Emergency Medicine

## 2012-07-04 DIAGNOSIS — Z79899 Other long term (current) drug therapy: Secondary | ICD-10-CM | POA: Insufficient documentation

## 2012-07-04 DIAGNOSIS — Z3202 Encounter for pregnancy test, result negative: Secondary | ICD-10-CM | POA: Insufficient documentation

## 2012-07-04 DIAGNOSIS — Z9851 Tubal ligation status: Secondary | ICD-10-CM | POA: Insufficient documentation

## 2012-07-04 DIAGNOSIS — F17200 Nicotine dependence, unspecified, uncomplicated: Secondary | ICD-10-CM | POA: Insufficient documentation

## 2012-07-04 DIAGNOSIS — N390 Urinary tract infection, site not specified: Secondary | ICD-10-CM

## 2012-07-04 DIAGNOSIS — R110 Nausea: Secondary | ICD-10-CM | POA: Insufficient documentation

## 2012-07-04 DIAGNOSIS — Z9089 Acquired absence of other organs: Secondary | ICD-10-CM | POA: Insufficient documentation

## 2012-07-04 LAB — URINALYSIS, ROUTINE W REFLEX MICROSCOPIC
Bilirubin Urine: NEGATIVE
Hgb urine dipstick: NEGATIVE
Nitrite: NEGATIVE
Specific Gravity, Urine: 1.019 (ref 1.005–1.030)
pH: 5.5 (ref 5.0–8.0)

## 2012-07-04 LAB — WET PREP, GENITAL

## 2012-07-04 LAB — URINE MICROSCOPIC-ADD ON

## 2012-07-04 MED ORDER — PHENAZOPYRIDINE HCL 100 MG PO TABS
200.0000 mg | ORAL_TABLET | Freq: Once | ORAL | Status: AC
  Administered 2012-07-04: 200 mg via ORAL
  Filled 2012-07-04: qty 2

## 2012-07-04 MED ORDER — PROMETHAZINE HCL 25 MG PO TABS
25.0000 mg | ORAL_TABLET | Freq: Once | ORAL | Status: AC
  Administered 2012-07-04: 25 mg via ORAL

## 2012-07-04 MED ORDER — PROMETHAZINE HCL 25 MG PO TABS
ORAL_TABLET | ORAL | Status: AC
  Administered 2012-07-04: 25 mg via ORAL
  Filled 2012-07-04: qty 1

## 2012-07-04 MED ORDER — AZITHROMYCIN 1 G PO PACK
2.0000 g | PACK | Freq: Once | ORAL | Status: AC
  Administered 2012-07-04: 2 g via ORAL
  Filled 2012-07-04: qty 2

## 2012-07-04 MED ORDER — NITROFURANTOIN MONOHYD MACRO 100 MG PO CAPS: 100.0000 mg | ORAL_CAPSULE | Freq: Two times a day (BID) | ORAL | Status: DC

## 2012-07-04 MED ORDER — PHENAZOPYRIDINE HCL 200 MG PO TABS: 200.0000 mg | ORAL_TABLET | Freq: Three times a day (TID) | ORAL | Status: DC

## 2012-07-04 NOTE — ED Notes (Signed)
Pt reports two days of lower abdominal "pelvic pain" that radiates around to her back.

## 2012-07-04 NOTE — ED Notes (Signed)
MD at bedside. 

## 2012-07-04 NOTE — ED Provider Notes (Addendum)
History     CSN: [id number]  Arrival date & time 07/04/12  0027   First MD Initiated Contact with Patient 07/04/12 0038      Chief Complaint  Patient presents with  . Pelvic Pain    (Consider location/radiation/quality/duration/timing/severity/associated sxs/prior treatment) HPI This is a 27 year old female with a 2 day history of pelvic pain. The pain began in the suprapubic region and radiated around to the back bilaterally. It has gradually gotten worse and is now more prominent in the right suprapubic region, again with radiation to the lower back bilaterally. It is worse with palpation, movement or ambulation. She has been nauseated, which she has treated with Zofran. She denies fever, chills, dysuria, hematuria, vaginal bleeding or vaginal discharge.  Past Medical History  Diagnosis Date  . Rheumatoid arthritis     Past Surgical History  Procedure Laterality Date  . Tubal ligation    . Tonsillectomy    . Cholecystectomy    . Adenoidectomy      No family history on file.  History  Substance Use Topics  . Smoking status: Current Some Day Smoker  . Smokeless tobacco: Not on file  . Alcohol Use: No    OB History   Grav Para Term Preterm Abortions TAB SAB Ect Mult Living                  Review of Systems  All other systems reviewed and are negative.    Allergies  Cephalexin; Ketorolac tromethamine; and Penicillins  Home Medications   Current Outpatient Rx  Name  Route  Sig  Dispense  Refill  . miconazole (MONISTAT 7) 2 % vaginal cream   Vaginal   Place 1 applicator vaginally at bedtime.         Kathrynn Running Estradiol-Fe (GENERESS FE PO)   Oral   Take 1 tablet by mouth daily.           BP 125/73  Pulse 100  Temp(Src) 98.1 F (36.7 C) (Oral)  Resp 18  Ht 5\' 5"  (1.651 m)  Wt 192 lb (87.091 kg)  BMI 31.95 kg/m2  SpO2 100%  LMP 06/21/2012  Physical Exam General: Well-developed, well-nourished female in no acute distress; appearance  consistent with age of record HENT: normocephalic, atraumatic Eyes: pupils equal round and reactive to light; extraocular muscles intact Neck: supple Heart: regular rate and rhythm Lungs: clear to auscultation bilaterally Abdomen: soft; nondistended; suprapubic tenderness; no masses or hepatosplenomegaly; bowel sounds present GU: Normal external genitalia; cervix normal parous; no vaginal bleeding; no vaginal discharge; bladder tenderness; right adnexal tenderness Extremities: No deformity; full range of motion; pulses normal Neurologic: Awake, alert and oriented; motor function intact in all extremities and symmetric; no facial droop Skin: Warm and dry Psychiatric: Normal mood and affect    ED Course  Procedures (including critical care time)     MDM   Nursing notes and vitals signs, including pulse oximetry, reviewed.  Summary of this visit's results, reviewed by myself:  Labs:  Results for orders placed during the hospital encounter of 07/04/12 (from the past 24 hour(s))  URINALYSIS, ROUTINE W REFLEX MICROSCOPIC     Status: Abnormal   Collection Time    07/04/12 12:42 AM      Result Value Range   Color, Urine YELLOW  YELLOW   APPearance CLEAR  CLEAR   Specific Gravity, Urine 1.019  1.005 - 1.030   pH 5.5  5.0 - 8.0   Glucose, UA NEGATIVE  NEGATIVE mg/dL  Hgb urine dipstick NEGATIVE  NEGATIVE   Bilirubin Urine NEGATIVE  NEGATIVE   Ketones, ur NEGATIVE  NEGATIVE mg/dL   Protein, ur NEGATIVE  NEGATIVE mg/dL   Urobilinogen, UA 0.2  0.0 - 1.0 mg/dL   Nitrite NEGATIVE  NEGATIVE   Leukocytes, UA SMALL (*) NEGATIVE  PREGNANCY, URINE     Status: None   Collection Time    07/04/12 12:42 AM      Result Value Range   Preg Test, Ur NEGATIVE  NEGATIVE  URINE MICROSCOPIC-ADD ON     Status: Abnormal   Collection Time    07/04/12 12:42 AM      Result Value Range   Squamous Epithelial / LPF MANY (*) RARE   WBC, UA 7-10  <3 WBC/hpf   RBC / HPF 0-2  <3 RBC/hpf   Bacteria, UA  FEW (*) RARE  WET PREP, GENITAL     Status: Abnormal   Collection Time    07/04/12 12:43 AM      Result Value Range   Yeast Wet Prep HPF POC NONE SEEN  NONE SEEN   Trich, Wet Prep NONE SEEN  NONE SEEN   Clue Cells Wet Prep HPF POC FEW (*) NONE SEEN   WBC, Wet Prep HPF POC FEW (*) NONE SEEN    1:18 AM Will treat for UTI and possible early cervicitis/PID. She states she was tested for STDs 3 weeks ago and was found to be negative. She was advised that if symptoms persist or worsen she should come back and we would consider an ultrasound to evaluate for a cyst.          Hanley Seamen, MD 07/04/12 4540  Hanley Seamen, MD 07/04/12 9811

## 2012-07-06 LAB — URINE CULTURE

## 2012-07-08 LAB — GC/CHLAMYDIA PROBE AMP
CT Probe RNA: NEGATIVE
GC Probe RNA: POSITIVE — AB

## 2012-07-10 NOTE — ED Notes (Signed)
+  Gonorrhea. Chart sent to EDP office for review. 

## 2012-07-12 ENCOUNTER — Encounter (HOSPITAL_BASED_OUTPATIENT_CLINIC_OR_DEPARTMENT_OTHER): Payer: Self-pay

## 2012-07-12 ENCOUNTER — Telehealth (HOSPITAL_COMMUNITY): Payer: Self-pay | Admitting: Emergency Medicine

## 2012-07-12 ENCOUNTER — Emergency Department (HOSPITAL_BASED_OUTPATIENT_CLINIC_OR_DEPARTMENT_OTHER)
Admission: EM | Admit: 2012-07-12 | Discharge: 2012-07-13 | Disposition: A | Discharge status: Home / Self Care | Payer: Medicaid Other | Attending: Emergency Medicine | Admitting: Emergency Medicine

## 2012-07-12 DIAGNOSIS — Z8739 Personal history of other diseases of the musculoskeletal system and connective tissue: Secondary | ICD-10-CM | POA: Insufficient documentation

## 2012-07-12 DIAGNOSIS — A5400 Gonococcal infection of lower genitourinary tract, unspecified: Secondary | ICD-10-CM | POA: Insufficient documentation

## 2012-07-12 DIAGNOSIS — F17200 Nicotine dependence, unspecified, uncomplicated: Secondary | ICD-10-CM | POA: Insufficient documentation

## 2012-07-12 DIAGNOSIS — A549 Gonococcal infection, unspecified: Secondary | ICD-10-CM

## 2012-07-12 MED ORDER — AZITHROMYCIN 250 MG PO TABS
1000.0000 mg | ORAL_TABLET | Freq: Once | ORAL | Status: AC
  Administered 2012-07-12: 1000 mg via ORAL
  Filled 2012-07-12: qty 4

## 2012-07-12 MED ORDER — LIDOCAINE HCL (PF) 1 % IJ SOLN
INTRAMUSCULAR | Status: AC
  Administered 2012-07-12: 2 mL
  Filled 2012-07-12: qty 5

## 2012-07-12 MED ORDER — CEFTRIAXONE SODIUM 250 MG IJ SOLR
250.0000 mg | Freq: Once | INTRAMUSCULAR | Status: AC
  Administered 2012-07-12: 250 mg via INTRAMUSCULAR
  Filled 2012-07-12: qty 250

## 2012-07-12 NOTE — ED Notes (Signed)
Chart returned from EDP office. Per Marlon Pel PA-C, Cefixime 400 mg tab. 400 mg PO 1 time dose. Disp #1. Azithromycin 1 gram tab. 1 gram PO 1 time dose. Disp #1.

## 2012-07-12 NOTE — ED Provider Notes (Signed)
History     CSN: [id number]  Arrival date & time 07/12/12  2211   First MD Initiated Contact with Patient 07/12/12 2306      Chief Complaint  Patient presents with  . recheck-called by flow manager     (Consider location/radiation/quality/duration/timing/severity/associated sxs/prior treatment) HPI Comments: Pt advised to come in for treatment on Gonorrhea.  Pt reports partner is also here to be treated. Pt reports some continued discomfort.   Pt unable to afford rx for oral medications  The history is provided by the patient. No language interpreter was used.    Past Medical History  Diagnosis Date  . Rheumatoid arthritis     Past Surgical History  Procedure Laterality Date  . Tubal ligation    . Tonsillectomy    . Cholecystectomy    . Adenoidectomy      No family history on file.  History  Substance Use Topics  . Smoking status: Current Some Day Smoker  . Smokeless tobacco: Not on file  . Alcohol Use: No    OB History   Grav Para Term Preterm Abortions TAB SAB Ect Mult Living                  Review of Systems  All other systems reviewed and are negative.    Allergies  Cephalexin; Ketorolac tromethamine; and Penicillins  Home Medications   Current Outpatient Rx  Name  Route  Sig  Dispense  Refill  . miconazole (MONISTAT 7) 2 % vaginal cream   Vaginal   Place 1 applicator vaginally at bedtime.         Kathrynn Running Estradiol-Fe (GENERESS FE PO)   Oral   Take 1 tablet by mouth daily.           BP 113/76  Pulse 98  Temp(Src) 99 F (37.2 C) (Oral)  Resp 18  SpO2 100%  LMP 06/21/2012  Physical Exam  Nursing note and vitals reviewed. Constitutional: She is oriented to person, place, and time. She appears well-developed and well-nourished.  HENT:  Head: Normocephalic.  Eyes: Pupils are equal, round, and reactive to light.  Cardiovascular: Normal rate and regular rhythm.   Pulmonary/Chest: Effort normal and breath sounds normal.   Abdominal: Bowel sounds are normal.  Neurological: She is alert and oriented to person, place, and time.  Skin: Skin is warm.  Psychiatric: She has a normal mood and affect.    ED Course  Procedures (including critical care time)  Labs Reviewed - No data to display No results found.   1. Gonorrhea       MDM  Pt given rocephin and zithromax        Elson Areas, PA-C 07/12/12 2327  Lonia Skinner [place], [place] 07/12/12 2327

## 2012-07-12 NOTE — ED Notes (Signed)
PA at bedside.

## 2012-07-12 NOTE — ED Notes (Signed)
Rxs called in to [facility] on [address] by Norm Parcel PFM.

## 2012-07-12 NOTE — ED Notes (Signed)
Patient reports that she was called this am by flow manager and informed she has gonorrhea. Reports that she has medicaid issues and cannot afford the medication, here for tx

## 2012-07-13 NOTE — ED Provider Notes (Signed)
Medical screening examination/treatment/procedure(s) were performed by non-physician practitioner and as supervising physician I was immediately available for consultation/collaboration.   Carleene Cooper III, MD 07/13/12 1026

## 2013-05-20 ENCOUNTER — Emergency Department (HOSPITAL_BASED_OUTPATIENT_CLINIC_OR_DEPARTMENT_OTHER): Payer: Medicaid Other

## 2013-05-20 ENCOUNTER — Encounter (HOSPITAL_BASED_OUTPATIENT_CLINIC_OR_DEPARTMENT_OTHER): Payer: Self-pay | Admitting: Emergency Medicine

## 2013-05-20 ENCOUNTER — Other Ambulatory Visit (HOSPITAL_BASED_OUTPATIENT_CLINIC_OR_DEPARTMENT_OTHER): Payer: Medicaid Other

## 2013-05-20 ENCOUNTER — Emergency Department (HOSPITAL_BASED_OUTPATIENT_CLINIC_OR_DEPARTMENT_OTHER)
Admission: EM | Admit: 2013-05-20 | Discharge: 2013-05-20 | Disposition: A | Discharge status: Home / Self Care | Payer: Medicaid Other | Attending: Emergency Medicine | Admitting: Emergency Medicine

## 2013-05-20 DIAGNOSIS — Z8739 Personal history of other diseases of the musculoskeletal system and connective tissue: Secondary | ICD-10-CM | POA: Insufficient documentation

## 2013-05-20 DIAGNOSIS — Z79899 Other long term (current) drug therapy: Secondary | ICD-10-CM | POA: Insufficient documentation

## 2013-05-20 DIAGNOSIS — N898 Other specified noninflammatory disorders of vagina: Secondary | ICD-10-CM | POA: Insufficient documentation

## 2013-05-20 DIAGNOSIS — F17200 Nicotine dependence, unspecified, uncomplicated: Secondary | ICD-10-CM | POA: Insufficient documentation

## 2013-05-20 DIAGNOSIS — A6000 Herpesviral infection of urogenital system, unspecified: Secondary | ICD-10-CM

## 2013-05-20 DIAGNOSIS — Z3202 Encounter for pregnancy test, result negative: Secondary | ICD-10-CM | POA: Insufficient documentation

## 2013-05-20 DIAGNOSIS — B009 Herpesviral infection, unspecified: Secondary | ICD-10-CM | POA: Insufficient documentation

## 2013-05-20 DIAGNOSIS — Z202 Contact with and (suspected) exposure to infections with a predominantly sexual mode of transmission: Secondary | ICD-10-CM | POA: Insufficient documentation

## 2013-05-20 LAB — URINE MICROSCOPIC-ADD ON

## 2013-05-20 LAB — URINALYSIS, ROUTINE W REFLEX MICROSCOPIC
Bilirubin Urine: NEGATIVE
Glucose, UA: NEGATIVE mg/dL
Hgb urine dipstick: NEGATIVE
Ketones, ur: NEGATIVE mg/dL
Nitrite: NEGATIVE
PROTEIN: NEGATIVE mg/dL
SPECIFIC GRAVITY, URINE: 1.025 (ref 1.005–1.030)
UROBILINOGEN UA: 0.2 mg/dL (ref 0.0–1.0)
pH: 6 (ref 5.0–8.0)

## 2013-05-20 LAB — PREGNANCY, URINE: PREG TEST UR: NEGATIVE

## 2013-05-20 LAB — WET PREP, GENITAL
TRICH WET PREP: NONE SEEN
YEAST WET PREP: NONE SEEN

## 2013-05-20 MED ORDER — VALACYCLOVIR HCL 1 G PO TABS: 1000.0000 mg | ORAL_TABLET | Freq: Three times a day (TID) | ORAL | Status: AC

## 2013-05-20 MED ORDER — AZITHROMYCIN 250 MG PO TABS
2000.0000 mg | ORAL_TABLET | Freq: Once | ORAL | Status: AC
  Administered 2013-05-20: 2000 mg via ORAL
  Filled 2013-05-20: qty 8

## 2013-05-20 NOTE — ED Provider Notes (Signed)
CSN: [id number]     Arrival date & time 05/20/13  1334 History   First MD Initiated Contact with Patient 05/20/13 1500     Chief Complaint  Patient presents with  . Abdominal Pain  . Vaginal Discharge   (Consider location/radiation/quality/duration/timing/severity/associated sxs/prior Treatment) Patient is a 28 y.o. female presenting with vaginal discharge. The history is provided by the patient.  Vaginal Discharge Quality:  White and watery Severity:  Moderate Onset quality:  Gradual Duration:  3 weeks Timing:  Constant Progression:  Worsening Chronicity:  New Context: spontaneously   Relieved by:  None tried Worsened by:  Nothing tried Ineffective treatments:  None tried Associated symptoms: abdominal pain, genital lesions and vaginal itching   Associated symptoms: no dyspareunia, no dysuria, no fever, no nausea, no urinary frequency, no urinary incontinence and no vomiting   Risk factors: STI and unprotected sex   Risk factors comment:  Prior hx of herpes   Past Medical History  Diagnosis Date  . Rheumatoid arthritis(714.0)    Past Surgical History  Procedure Laterality Date  . Tubal ligation    . Tonsillectomy    . Cholecystectomy    . Adenoidectomy     No family history on file. History  Substance Use Topics  . Smoking status: Current Some Day Smoker  . Smokeless tobacco: Not on file  . Alcohol Use: No   OB History   Grav Para Term Preterm Abortions TAB SAB Ect Mult Living                 Review of Systems  Constitutional: Negative for fever.  Gastrointestinal: Positive for abdominal pain. Negative for nausea and vomiting.  Genitourinary: Positive for vaginal discharge. Negative for bladder incontinence, dysuria and dyspareunia.  All other systems reviewed and are negative.    Allergies  Cephalexin and Ketorolac tromethamine  Home Medications   Current Outpatient Rx  Name  Route  Sig  Dispense  Refill  . miconazole (MONISTAT 7) 2 % vaginal  cream   Vaginal   Place 1 applicator vaginally at bedtime.         Cyndie Chime Estradiol-Fe (GENERESS FE PO)   Oral   Take 1 tablet by mouth daily.         . valACYclovir (VALTREX) 1000 MG tablet   Oral   Take 1 tablet (1,000 mg total) by mouth 3 (three) times daily.   30 tablet   0    BP 136/80  Pulse 83  Temp(Src) 98.5 F (36.9 C) (Oral)  Wt 195 lb (88.451 kg)  SpO2 100%  LMP 05/14/2013 Physical Exam  Nursing note and vitals reviewed. Constitutional: She is oriented to person, place, and time. She appears well-developed and well-nourished. No distress.  HENT:  Head: Normocephalic and atraumatic.  Mouth/Throat: Oropharynx is clear and moist.  Eyes: Conjunctivae and EOM are normal. Pupils are equal, round, and reactive to light.  Neck: Normal range of motion. Neck supple.  Cardiovascular: Normal rate, regular rhythm and intact distal pulses.   No murmur heard. Pulmonary/Chest: Effort normal and breath sounds normal. No respiratory distress. She has no wheezes. She has no rales.  Abdominal: Soft. She exhibits no distension. There is tenderness in the suprapubic area. There is no rebound, no guarding and no CVA tenderness.  Genitourinary: Uterus normal.    There is lesion on the left labia. Cervix exhibits discharge. Cervix exhibits no motion tenderness and no friability. Right adnexum displays no mass, no tenderness and no fullness. Left adnexum  displays tenderness. Left adnexum displays no mass and no fullness. There is tenderness around the vagina.  Musculoskeletal: Normal range of motion. She exhibits no edema and no tenderness.  Neurological: She is alert and oriented to person, place, and time.  Skin: Skin is warm and dry. No rash noted. No erythema.  Psychiatric: She has a normal mood and affect. Her behavior is normal.    ED Course  Procedures (including critical care time) Labs Review Labs Reviewed  WET PREP, GENITAL - Abnormal; Notable for the following:     Clue Cells Wet Prep HPF POC FEW (*)    WBC, Wet Prep HPF POC FEW (*)    All other components within normal limits  URINALYSIS, ROUTINE W REFLEX MICROSCOPIC - Abnormal; Notable for the following:    Leukocytes, UA TRACE (*)    All other components within normal limits  URINE MICROSCOPIC-ADD ON - Abnormal; Notable for the following:    Squamous Epithelial / LPF FEW (*)    Bacteria, UA MANY (*)    All other components within normal limits  GC/CHLAMYDIA PROBE AMP  PREGNANCY, URINE   Imaging Review No results found.  EKG Interpretation   None       MDM   1. Genital herpes   2. Possible exposure to STD    Patient presents with symptoms concerning for sexual transmitted infection and evidence of herpes outbreak on exam. Patient has been diagnosed with herpes in the past has been Valtrex several years ago but has not had an outbreak until recently. Patient is sexually active with one partner and does not use protection however that partner has given her sexual transmitted infection in the past. She does have significant tenderness over the left ovary and recommend doing an ultrasound to evaluate for TOA however patient states she has to get home to her kids and is now going to have the test done at this time. Urine is negative for signs of UTI and urine pregnancy test is negative. Because patient is allergic to Keflex she was given 2 g of azithromycin to cover for sexually transmitted infections. She was also given Valtrex. She was given strict return options if symptoms worsen so that she can undergo an ultrasound at that time.    Blanchie Dessert, MD 05/20/13 2000

## 2013-05-20 NOTE — ED Notes (Signed)
C/o abd pain and vaginal d/c x 2-3 weeks

## 2013-05-20 NOTE — ED Notes (Signed)
MD at bedside. 

## 2013-05-20 NOTE — Discharge Instructions (Signed)
Genital Herpes  Genital herpes is a sexually transmitted disease. This means that it is a disease passed by having sex with an infected person. There is no cure for genital herpes. The time between attacks can be months to years. The virus may live in a person but produce no problems (symptoms). This infection can be passed to a baby as it travels down the birth canal (vagina). In a newborn, this can cause central nervous system damage, eye damage, or even death. The virus that causes genital herpes is usually HSV-2 virus. The virus that causes oral herpes is usually HSV-1. The diagnosis (learning what is wrong) is made through culture results.  SYMPTOMS   Usually symptoms of pain and itching begin a few days to a week after contact. It first appears as small blisters that progress to small painful ulcers which then scab over and heal after several days. It affects the outer genitalia, birth canal, cervix, penis, anal area, buttocks, and thighs.  HOME CARE INSTRUCTIONS   · Keep ulcerated areas dry and clean.  · Take medications as directed. Antiviral medications can speed up healing. They will not prevent recurrences or cure this infection. These medications can also be taken for suppression if there are frequent recurrences.  · While the infection is active, it is contagious. Avoid all sexual contact during active infections.  · Condoms may help prevent spread of the herpes virus.  · Practice safe sex.  · Wash your hands thoroughly after touching the genital area.  · Avoid touching your eyes after touching your genital area.  · Inform your caregiver if you have had genital herpes and become pregnant. It is your responsibility to insure a safe outcome for your baby in this pregnancy.  · Only take over-the-counter or prescription medicines for pain, discomfort, or fever as directed by your caregiver.  SEEK MEDICAL CARE IF:   · You have a recurrence of this infection.  · You do not respond to medications and are not  improving.  · You have new sources of pain or discharge which have changed from the original infection.  · You have an oral temperature above 102° F (38.9° C).  · You develop abdominal pain.  · You develop eye pain or signs of eye infection.  Document Released: 04/12/2000 Document Revised: 07/08/2011 Document Reviewed: 05/03/2009  ExitCare® Patient Information ©2014 [employer].

## 2013-05-21 LAB — GC/CHLAMYDIA PROBE AMP
CT Probe RNA: NEGATIVE
GC Probe RNA: NEGATIVE

## 2013-07-14 DIAGNOSIS — M129 Arthropathy, unspecified: Secondary | ICD-10-CM | POA: Insufficient documentation

## 2013-07-16 ENCOUNTER — Emergency Department (HOSPITAL_BASED_OUTPATIENT_CLINIC_OR_DEPARTMENT_OTHER): Payer: Medicaid Other

## 2013-07-16 ENCOUNTER — Emergency Department (HOSPITAL_BASED_OUTPATIENT_CLINIC_OR_DEPARTMENT_OTHER)
Admission: EM | Admit: 2013-07-16 | Discharge: 2013-07-16 | Disposition: A | Discharge status: Home / Self Care | Payer: Medicaid Other | Attending: Emergency Medicine | Admitting: Emergency Medicine

## 2013-07-16 ENCOUNTER — Encounter (HOSPITAL_BASED_OUTPATIENT_CLINIC_OR_DEPARTMENT_OTHER): Payer: Self-pay | Admitting: Emergency Medicine

## 2013-07-16 DIAGNOSIS — F17200 Nicotine dependence, unspecified, uncomplicated: Secondary | ICD-10-CM | POA: Insufficient documentation

## 2013-07-16 DIAGNOSIS — Z3202 Encounter for pregnancy test, result negative: Secondary | ICD-10-CM | POA: Insufficient documentation

## 2013-07-16 DIAGNOSIS — Z3189 Encounter for other procreative management: Secondary | ICD-10-CM | POA: Insufficient documentation

## 2013-07-16 DIAGNOSIS — N760 Acute vaginitis: Secondary | ICD-10-CM | POA: Insufficient documentation

## 2013-07-16 DIAGNOSIS — B9689 Other specified bacterial agents as the cause of diseases classified elsewhere: Secondary | ICD-10-CM | POA: Insufficient documentation

## 2013-07-16 DIAGNOSIS — R112 Nausea with vomiting, unspecified: Secondary | ICD-10-CM | POA: Insufficient documentation

## 2013-07-16 DIAGNOSIS — R102 Pelvic and perineal pain: Secondary | ICD-10-CM

## 2013-07-16 DIAGNOSIS — Z8619 Personal history of other infectious and parasitic diseases: Secondary | ICD-10-CM | POA: Insufficient documentation

## 2013-07-16 DIAGNOSIS — A499 Bacterial infection, unspecified: Secondary | ICD-10-CM | POA: Insufficient documentation

## 2013-07-16 DIAGNOSIS — Z79899 Other long term (current) drug therapy: Secondary | ICD-10-CM | POA: Insufficient documentation

## 2013-07-16 DIAGNOSIS — N9489 Other specified conditions associated with female genital organs and menstrual cycle: Secondary | ICD-10-CM | POA: Insufficient documentation

## 2013-07-16 DIAGNOSIS — Z8739 Personal history of other diseases of the musculoskeletal system and connective tissue: Secondary | ICD-10-CM | POA: Insufficient documentation

## 2013-07-16 HISTORY — DX: Herpesviral infection of urogenital system, unspecified: A60.00

## 2013-07-16 LAB — URINALYSIS, ROUTINE W REFLEX MICROSCOPIC
Bilirubin Urine: NEGATIVE
GLUCOSE, UA: NEGATIVE mg/dL
HGB URINE DIPSTICK: NEGATIVE
Ketones, ur: NEGATIVE mg/dL
Nitrite: NEGATIVE
PH: 6.5 (ref 5.0–8.0)
Protein, ur: NEGATIVE mg/dL
SPECIFIC GRAVITY, URINE: 1.016 (ref 1.005–1.030)
Urobilinogen, UA: 0.2 mg/dL (ref 0.0–1.0)

## 2013-07-16 LAB — URINE MICROSCOPIC-ADD ON: RBC / HPF: NONE SEEN RBC/hpf (ref <3)

## 2013-07-16 LAB — WET PREP, GENITAL
TRICH WET PREP: NONE SEEN
YEAST WET PREP: NONE SEEN

## 2013-07-16 LAB — PREGNANCY, URINE: Preg Test, Ur: NEGATIVE

## 2013-07-16 MED ORDER — AZITHROMYCIN 250 MG PO TABS
1000.0000 mg | ORAL_TABLET | Freq: Once | ORAL | Status: AC
  Administered 2013-07-16: 1000 mg via ORAL
  Filled 2013-07-16: qty 4

## 2013-07-16 MED ORDER — ONDANSETRON HCL 8 MG PO TABS
4.0000 mg | ORAL_TABLET | Freq: Once | ORAL | Status: AC
  Administered 2013-07-16: 4 mg via ORAL
  Filled 2013-07-16: qty 1

## 2013-07-16 MED ORDER — DICYCLOMINE HCL 10 MG PO CAPS
10.0000 mg | ORAL_CAPSULE | Freq: Once | ORAL | Status: AC
  Administered 2013-07-16: 10 mg via ORAL
  Filled 2013-07-16: qty 1

## 2013-07-16 MED ORDER — METRONIDAZOLE 500 MG PO TABS: 500.0000 mg | ORAL_TABLET | Freq: Two times a day (BID) | ORAL | Status: DC

## 2013-07-16 MED ORDER — ONDANSETRON HCL 4 MG PO TABS: 4.0000 mg | ORAL_TABLET | Freq: Three times a day (TID) | ORAL | Status: DC | PRN

## 2013-07-16 MED ORDER — CEFTRIAXONE SODIUM 250 MG IJ SOLR
250.0000 mg | Freq: Once | INTRAMUSCULAR | Status: AC
  Administered 2013-07-16: 250 mg via INTRAMUSCULAR
  Filled 2013-07-16: qty 250

## 2013-07-16 NOTE — ED Provider Notes (Signed)
I saw and evaluated the patient, reviewed the resident's note and I agree with the findings and plan.   EKG Interpretation None      Pt with abd pain, vaginal discharge, vomiting and diarrhea. She has numerous previous ED visits for similar. Labs and Imaging neg. Abd benign.   Charles B. Karle Starch, MD 07/16/13 1340

## 2013-07-16 NOTE — ED Notes (Signed)
Patient transported to Ultrasound 

## 2013-07-16 NOTE — ED Notes (Signed)
Pt reports abdominal pain started Tuesday, nausea and vomiting and vaginal d/c started yesterday. Possible exposure to an STD and states she has "bed bugs".

## 2013-07-16 NOTE — Discharge Instructions (Signed)
Viral Gastroenteritis Viral gastroenteritis is also known as stomach flu. This condition affects the stomach and intestinal tract. It can cause sudden diarrhea and vomiting. The illness typically lasts 3 to 8 days. Most people develop an immune response that eventually gets rid of the virus. While this natural response develops, the virus can make you quite ill. CAUSES  Many different viruses can cause gastroenteritis, such as rotavirus or noroviruses. You can catch one of these viruses by consuming contaminated food or water. You may also catch a virus by sharing utensils or other personal items with an infected person or by touching a contaminated surface. SYMPTOMS  The most common symptoms are diarrhea and vomiting. These problems can cause a severe loss of body fluids (dehydration) and a body salt (electrolyte) imbalance. Other symptoms may include:  Fever.  Headache.  Fatigue.  Abdominal pain. DIAGNOSIS  Your caregiver can usually diagnose viral gastroenteritis based on your symptoms and a physical exam. A stool sample may also be taken to test for the presence of viruses or other infections. TREATMENT  This illness typically goes away on its own. Treatments are aimed at rehydration. The most serious cases of viral gastroenteritis involve vomiting so severely that you are not able to keep fluids down. In these cases, fluids must be given through an intravenous line (IV). HOME CARE INSTRUCTIONS   Drink enough fluids to keep your urine clear or pale yellow. Drink small amounts of fluids frequently and increase the amounts as tolerated.  Ask your caregiver for specific rehydration instructions.  Avoid:  Foods high in sugar.  Alcohol.  Carbonated drinks.  Tobacco.  Juice.  Caffeine drinks.  Extremely hot or cold fluids.  Fatty, greasy foods.  Too much intake of anything at one time.  Dairy products until 24 to 48 hours after diarrhea stops.  You may consume probiotics.  Probiotics are active cultures of beneficial bacteria. They may lessen the amount and number of diarrheal stools in adults. Probiotics can be found in yogurt with active cultures and in supplements.  Wash your hands well to avoid spreading the virus.  Only take over-the-counter or prescription medicines for pain, discomfort, or fever as directed by your caregiver. Do not give aspirin to children. Antidiarrheal medicines are not recommended.  Ask your caregiver if you should continue to take your regular prescribed and over-the-counter medicines.  Keep all follow-up appointments as directed by your caregiver. SEEK IMMEDIATE MEDICAL CARE IF:   You are unable to keep fluids down.  You do not urinate at least once every 6 to 8 hours.  You develop shortness of breath.  You notice blood in your stool or vomit. This may look like coffee grounds.  You have abdominal pain that increases or is concentrated in one small area (localized).  You have persistent vomiting or diarrhea.  You have a fever.  The patient is a child younger than 3 months, and he or she has a fever.  The patient is a child older than 3 months, and he or she has a fever and persistent symptoms.  The patient is a child older than 3 months, and he or she has a fever and symptoms suddenly get worse.  The patient is a baby, and he or she has no tears when crying. MAKE SURE YOU:   Understand these instructions.  Will watch your condition.  Will get help right away if you are not doing well or get worse. Document Released: 04/15/2005 Document Revised: 07/08/2011 Document Reviewed: 01/30/2011  ExitCare Patient Information 2014 [place].   Safe Sex Safe sex is about reducing the risk of giving or getting a sexually transmitted disease (STD). STDs are spread through sexual contact involving the genitals, mouth, or rectum. Some STDS can be cured and others cannot. Safe sex can also prevent unintended pregnancies.    SAFE SEX PRACTICES  Limit your sexual activity to only one partner who is only having sex with you.  Talk to your partner about their past partners, past STDs, and drug use.  Use a condom every time you have sexual intercourse. This includes vaginal, oral, and anal sexual activity. Both females and males should wear condoms during oral sex. Only use latex or polyurethane condoms and water-based lubricants. Petroleum-based lubricants or oils used to lubricate a condom will weaken the condom and increase the chance that it will break. The condom should be in place from the beginning to the end of sexual activity. Wearing a condom reduces, but does not completely eliminate, your risk of getting or giving a STD. STDs can be spread by contact with skin of surrounding areas.  Get vaccinated for hepatitis B and HPV.  Avoid alcohol and recreational drugs which can affect your judgement. You may forget to use a condom or participate in high-risk sex.  For females, avoid douching after sexual intercourse. Douching can spread an infection farther into the reproductive tract.  Check your body for signs of sores, blisters, rashes, or unusual discharge. See your caregiver if you notice any of these signs.  Avoid sexual contact if you have symptoms of an infection or are being treated for an STD. If you or your partner has herpes, avoid sexual contact when blisters are present. Use condoms at all other times.  See your caregiver for regular screenings, examinations, and tests for STDs. Before having sex with a new partner, each of you should be screened for STDs and talk about the results with your partner. BENEFITS OF SAFE SEX   There is less of a chance of getting or giving an STD.  You can prevent unwanted or unintended pregnancies.  By discussing safer sex concerns with your partner, you may increase feelings of intimacy, comfort, trust, and honesty between the both of you. Document Released:  05/23/2004 Document Revised: 01/08/2012 Document Reviewed: 10/07/2011 [facility] Patient Information 2014 [place].

## 2013-07-16 NOTE — ED Provider Notes (Signed)
CSN: [id number]     Arrival date & time 07/16/13  2423 History   First MD Initiated Contact with Patient 07/16/13 [phone]     Chief Complaint  Patient presents with  . Abdominal Pain  . Vaginal Discharge  . Emesis   HPI Rhonda Mcgee is 28 y.o. female presents to the ED with abdominal pain that started Tuesday, associated with nausea and vomiting that started yesterday. She endorses diarrhea that started 3 days ago. She has a history of cholecystectomy. No recent antibiotic use, no travel outside the country.  She has not been able to keep food or water down and she feels weak and drained. She also reports she has noticed thick white malodorous vaginal discharge that  started yesterday. She admits to possible exposure to an STD. Patient has had STD prior. She also states she has "bed bugs" that she was "bit up". She has concerns this could be the cause of her symptoms. Her son is also sick and was taken to ED, he was diagnosed with a mild cold. She has had the house exterminated.   Past Medical History  Diagnosis Date  . Rheumatoid arthritis(714.0)   . Herpes genitalia    Past Surgical History  Procedure Laterality Date  . Tubal ligation    . Tonsillectomy    . Cholecystectomy    . Adenoidectomy     No family history on file. History  Substance Use Topics  . Smoking status: Current Some Day Smoker    Types: Cigarettes  . Smokeless tobacco: Not on file  . Alcohol Use: No   OB History   Grav Para Term Preterm Abortions TAB SAB Ect Mult Living                 Review of Systems    Allergies  Cephalexin and Ketorolac tromethamine  Home Medications   Current Outpatient Rx  Name  Route  Sig  Dispense  Refill  . metroNIDAZOLE (FLAGYL) 500 MG tablet   Oral   Take 1 tablet (500 mg total) by mouth 2 (two) times daily.   14 tablet   0   . miconazole (MONISTAT 7) 2 % vaginal cream   Vaginal   Place 1 applicator vaginally at bedtime.         Cyndie Chime Estradiol-Fe  (GENERESS FE PO)   Oral   Take 1 tablet by mouth daily.         . ondansetron (ZOFRAN) 4 MG tablet   Oral   Take 1 tablet (4 mg total) by mouth every 8 (eight) hours as needed for nausea or vomiting.   20 tablet   0    BP 128/87  Pulse 85  Temp(Src) 98.2 F (36.8 C) (Oral)  Resp 16  Ht 5\' 5"  (1.651 m)  Wt 195 lb (88.451 kg)  BMI 32.45 kg/m2  SpO2 100%  LMP 06/27/2013 Physical Exam Gen: NAD. Nontoxic. HEENT: AT. [place]. Bilateral TM visualized and normal in appearance. Bilateral eyes without injections or icterus. MMM. Bilateral nares without erythema. Throat without erythema or exudates.  CV: RRR no murmurs clicks gallops or rubs Chest: CTAB, no wheeze or crackles Abd: Soft. NT, mildly tender diffusely BS present. No Masses palpated.  Ext: No erythema. No edema.  Skin: No rashes, purpura or petechiae.  Neuro:  Normal gait. PERLA. EOMi. Alert. Grossly intact.  Psych: Normal dress, affect and demeanor. Normal speech   GYN:  External genitalia within normal limits.  Vaginal mucosa pink, moist, normal  rugae.  Punctate cervix without lesions, moderate malodorous white discharge, no bleeding noted on speculum exam.  Bimanual exam revealed normal, nongravid uterus. Positive  cervical motion tenderness. No adnexal masses bilaterally.  Positive tenderness right adnexa.   ED Course  Procedures (including critical care time) Labs Review Labs Reviewed  WET PREP, GENITAL - Abnormal; Notable for the following:    Clue Cells Wet Prep HPF POC MANY (*)    WBC, Wet Prep HPF POC MANY (*)    All other components within normal limits  URINALYSIS, ROUTINE W REFLEX MICROSCOPIC - Abnormal; Notable for the following:    APPearance CLOUDY (*)    Leukocytes, UA TRACE (*)    All other components within normal limits  URINE MICROSCOPIC-ADD ON - Abnormal; Notable for the following:    Squamous Epithelial / LPF MANY (*)    Bacteria, UA MANY (*)    All other components within normal limits   GC/CHLAMYDIA PROBE AMP  PREGNANCY, URINE   Imaging Review US Transvaginal Non-ob  07/16/2013   CLINICAL DATA:  Abnormal adnexal tenderness for several months.  EXAM: TRANSVAGINAL ULTRASOUND OF PELVIS  TECHNIQUE: Transvaginal ultrasound examination of the pelvis was performed including evaluation of the uterus, ovaries, adnexal regions, and pelvic cul-de-sac.  COMPARISON:  None.  FINDINGS: Uterus  Measurements: 7.5 x 4.7 x 5.7 cm. The uterus is retroverted. There is a small sub mucosal hypodensity within the posterior myometrium which measures 1.3 x 0.7 x 1.4 cm. Smaller intramural hypodensity within the posterior myometrium measures 6 mm.  Endometrium  Thickness: 7 mm.  No focal abnormality visualized.  Right ovary  Measurements: 3.6 x 2.5 x 3.1 cm. Corpus luteum is identified within the right ovary measuring 1.7 x 1.3 x 1.7 cm.  Left ovary  Measurements: 3.9 x 2.1 x 2.3 cm. Normal appearance/no adnexal mass.  Other findings: Small amount of free fluid is noted within the pelvis.  IMPRESSION: 1. 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   Electronically Signed   By: Kerby Moors M.D.   On: 07/16/2013 11:33   US Pelvis Complete  07/16/2013   CLINICAL DATA:  Abnormal adnexal tenderness for several months.  EXAM: TRANSVAGINAL ULTRASOUND OF PELVIS  TECHNIQUE: Transvaginal ultrasound examination of the pelvis was performed including evaluation of the uterus, ovaries, adnexal regions, and pelvic cul-de-sac.  COMPARISON:  None.  FINDINGS: Uterus  Measurements: 7.5 x 4.7 x 5.7 cm. The uterus is retroverted. There is a small sub mucosal hypodensity within the posterior myometrium which measures 1.3 x 0.7 x 1.4 cm. Smaller intramural hypodensity within the posterior myometrium measures 6 mm.  Endometrium  Thickness: 7 mm.  No focal abnormality visualized.  Right ovary  Measurements: 3.6 x 2.5 x 3.1 cm. Corpus luteum is identified within the right ovary measuring 1.7 x  1.3 x 1.7 cm.  Left ovary  Measurements: 3.9 x 2.1 x 2.3 cm. Normal appearance/no adnexal mass.  Other findings: Small amount of free fluid is noted within the pelvis.  IMPRESSION: 1. 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   Electronically Signed   By: Kerby Moors M.D.   On: 07/16/2013 11:33     EKG Interpretation None      MDM   Final diagnoses:  Right adnexal tenderness  Bacterial vaginosis    She presented to the ED with nausea, vomiting, diarrhea in addition to exposure to STD. Patient is nontoxic and in no acute distress. She was given Zofran for  nausea with great improvement. Wet prep was completed which showed bacterial vaginosis. Gonorrhea and Chlamydia cultures were collected. Patient had right adnexal pain and cervical motion tenderness exam, with punctate lesions cervical os. Abdominal ultrasound was obtained and was normal with the exception of some small fibroids. Patient was treated with ceftriaxone IM and azithromycin 1 g. Patient was given a prescription for Flagyl to treat her BV. Patient was given a prescription for Zofran to help with the nausea. She was encouraged to follow a clear liquid diet for 24-48 hours. Patient was encouraged to followup with a PCP.    Ma Hillock, DO 07/16/13 1142

## 2013-07-16 NOTE — ED Notes (Signed)
Pelvic cart is at the bedside set up and ready for the doctor to use. 

## 2013-07-17 LAB — GC/CHLAMYDIA PROBE AMP
CT PROBE, AMP APTIMA: NEGATIVE
GC Probe RNA: NEGATIVE

## 2013-08-17 ENCOUNTER — Emergency Department (HOSPITAL_BASED_OUTPATIENT_CLINIC_OR_DEPARTMENT_OTHER)
Admission: EM | Admit: 2013-08-17 | Discharge: 2013-08-17 | Disposition: A | Discharge status: Home / Self Care | Payer: Medicaid Other | Attending: Emergency Medicine | Admitting: Emergency Medicine

## 2013-08-17 DIAGNOSIS — Z8739 Personal history of other diseases of the musculoskeletal system and connective tissue: Secondary | ICD-10-CM | POA: Insufficient documentation

## 2013-08-17 DIAGNOSIS — Z3202 Encounter for pregnancy test, result negative: Secondary | ICD-10-CM | POA: Insufficient documentation

## 2013-08-17 DIAGNOSIS — N760 Acute vaginitis: Secondary | ICD-10-CM | POA: Insufficient documentation

## 2013-08-17 DIAGNOSIS — F17200 Nicotine dependence, unspecified, uncomplicated: Secondary | ICD-10-CM | POA: Insufficient documentation

## 2013-08-17 DIAGNOSIS — A499 Bacterial infection, unspecified: Secondary | ICD-10-CM | POA: Insufficient documentation

## 2013-08-17 DIAGNOSIS — Z79899 Other long term (current) drug therapy: Secondary | ICD-10-CM | POA: Insufficient documentation

## 2013-08-17 DIAGNOSIS — B9689 Other specified bacterial agents as the cause of diseases classified elsewhere: Secondary | ICD-10-CM | POA: Insufficient documentation

## 2013-08-17 LAB — WET PREP, GENITAL
TRICH WET PREP: NONE SEEN
Yeast Wet Prep HPF POC: NONE SEEN

## 2013-08-17 LAB — PREGNANCY, URINE: PREG TEST UR: NEGATIVE

## 2013-08-17 MED ORDER — METRONIDAZOLE 0.75 % VA GEL: 1.0000 | Freq: Two times a day (BID) | VAGINAL | Status: DC

## 2013-08-17 MED ORDER — FLUCONAZOLE 150 MG PO TABS: 150.0000 mg | ORAL_TABLET | Freq: Every day | ORAL | Status: AC

## 2013-08-17 NOTE — Discharge Instructions (Signed)
Bacterial Vaginosis Bacterial vaginosis is an infection of the vagina. It happens when too many of certain germs (bacteria) grow in the vagina. HOME CARE  Take your medicine as told by your doctor.  Finish your medicine even if you start to feel better.  Do not have sex until you finish your medicine and are better.  Tell your sex partner that you have an infection. They should see their doctor for treatment.  Practice safe sex. Use condoms. Have only one sex partner. GET HELP IF:  You are not getting better after 3 days of treatment.  You have more grey fluid (discharge) coming from your vagina than before.  You have more pain than before.  You have a fever. MAKE SURE YOU:   Understand these instructions.  Will watch your condition.  Will get help right away if you are not doing well or get worse. Document Released: 01/23/2008 Document Revised: 02/03/2013 Document Reviewed: 11/25/2012 [employer] Patient Information 2014 [employer].  

## 2013-08-17 NOTE — ED Provider Notes (Signed)
CSN: [id number]     Arrival date & time 08/17/13  1224 History   First MD Initiated Contact with Patient 08/17/13 1248     Chief Complaint  Patient presents with  . Vaginal Discharge     (Consider location/radiation/quality/duration/timing/severity/associated sxs/prior Treatment) HPI Comments: The patient returns to ED for evaluation of persistent vaginal discharge and vaginal itching since being seen one month ago for same. No fever, abdominal pain, N, V or dysuria. She denies having new sexual partners since the previous evaluation. She was concerned because she did not receive the results of cultures for chlamydia and GC.  The history is provided by the patient. No language interpreter was used.    Past Medical History  Diagnosis Date  . Rheumatoid arthritis(714.0)   . Herpes genitalia    Past Surgical History  Procedure Laterality Date  . Tubal ligation    . Tonsillectomy    . Cholecystectomy    . Adenoidectomy     No family history on file. History  Substance Use Topics  . Smoking status: Current Some Day Smoker    Types: Cigarettes  . Smokeless tobacco: Not on file  . Alcohol Use: No   OB History   Grav Para Term Preterm Abortions TAB SAB Ect Mult Living                 Review of Systems  Constitutional: Negative for fever.  Gastrointestinal: Negative for nausea, vomiting and abdominal pain.  Genitourinary: Positive for vaginal discharge. Negative for dysuria, vaginal bleeding and pelvic pain.  Musculoskeletal: Negative for myalgias.  Skin: Negative for rash.      Allergies  Cephalexin and Ketorolac tromethamine  Home Medications   Prior to Admission medications   Medication Sig Start Date End Date Taking? Authorizing Provider  ondansetron (ZOFRAN) 4 MG tablet Take 1 tablet (4 mg total) by mouth every 8 (eight) hours as needed for nausea or vomiting. 07/16/13  Yes Renee A Kuneff, DO  metroNIDAZOLE (FLAGYL) 500 MG tablet Take 1 tablet (500 mg total) by  mouth 2 (two) times daily. 07/16/13   Renee A Kuneff, DO  miconazole (MONISTAT 7) 2 % vaginal cream Place 1 applicator vaginally at bedtime.    Historical Provider, MD  Norethin-Eth Estradiol-Fe (GENERESS FE PO) Take 1 tablet by mouth daily.    Historical Provider, MD   BP 126/75  Pulse 87  Temp(Src) 98.5 F (36.9 C) (Oral)  Resp 16  Ht 5\' 5"  (1.651 m)  Wt 195 lb (88.451 kg)  BMI 32.45 kg/m2  SpO2 100%  LMP 07/11/2013 Physical Exam  Constitutional: She is oriented to person, place, and time. She appears well-developed and well-nourished.  HENT:  Head: Normocephalic.  Neck: Normal range of motion. Neck supple.  Pulmonary/Chest: Effort normal.  Abdominal: Soft. Bowel sounds are normal. There is no tenderness. There is no rebound and no guarding.  Genitourinary: Uterus normal. Vaginal discharge found.  White cervical discharge present. No cervical friability or gross tenderness. No adnexal mass or tenderness.  Musculoskeletal: Normal range of motion.  Neurological: She is alert and oriented to person, place, and time.  Skin: Skin is warm and dry. No rash noted.  Psychiatric: She has a normal mood and affect.    ED Course  Procedures (including critical care time) Labs Review Labs Reviewed  WET PREP, GENITAL - Abnormal; Notable for the following:    Clue Cells Wet Prep HPF POC MANY (*)    WBC, Wet Prep HPF POC FEW (*)  All other components within normal limits  PREGNANCY, URINE    Imaging Review No results found.   EKG Interpretation None      MDM   Final diagnoses:  None    1. Bacterial vaginosis  Labs support diagnosis of BV without evidence of PID. Repeat her Flagyl Rx, also giving Diflucan for recurrent yeast infections associated with abx treatment in the past. Refer to GYN if symptoms persist.     Dewaine Oats, PA-C 08/20/13 1549

## 2013-08-17 NOTE — ED Notes (Signed)
Pt sts she was seen here at [facility] 1 month ago for possible STD. Sts she was treated, and continues to have itching, burning, lower abd pain, vaginal discharge and odor. Denies fever and chills, but has nausea and diarrhea. She states she has not received a phone call from previous studies done to confirm any positive testing.

## 2013-08-21 NOTE — ED Provider Notes (Signed)
  Medical screening examination/treatment/procedure(s) were performed by non-physician practitioner and as supervising physician I was immediately available for consultation/collaboration.   Carmin Muskrat, MD 08/21/13 1224

## 2013-11-02 DIAGNOSIS — A6000 Herpesviral infection of urogenital system, unspecified: Secondary | ICD-10-CM | POA: Insufficient documentation

## 2013-11-28 ENCOUNTER — Emergency Department (HOSPITAL_BASED_OUTPATIENT_CLINIC_OR_DEPARTMENT_OTHER)
Admission: EM | Admit: 2013-11-28 | Discharge: 2013-11-28 | Disposition: A | Discharge status: Home / Self Care | Payer: Medicaid Other | Attending: Emergency Medicine | Admitting: Emergency Medicine

## 2013-11-28 ENCOUNTER — Encounter (HOSPITAL_BASED_OUTPATIENT_CLINIC_OR_DEPARTMENT_OTHER): Payer: Self-pay | Admitting: Emergency Medicine

## 2013-11-28 DIAGNOSIS — Z3202 Encounter for pregnancy test, result negative: Secondary | ICD-10-CM | POA: Diagnosis not present

## 2013-11-28 DIAGNOSIS — Z8619 Personal history of other infectious and parasitic diseases: Secondary | ICD-10-CM | POA: Insufficient documentation

## 2013-11-28 DIAGNOSIS — B3731 Acute candidiasis of vulva and vagina: Secondary | ICD-10-CM

## 2013-11-28 DIAGNOSIS — R109 Unspecified abdominal pain: Secondary | ICD-10-CM | POA: Insufficient documentation

## 2013-11-28 DIAGNOSIS — N739 Female pelvic inflammatory disease, unspecified: Secondary | ICD-10-CM | POA: Diagnosis not present

## 2013-11-28 DIAGNOSIS — N760 Acute vaginitis: Secondary | ICD-10-CM | POA: Insufficient documentation

## 2013-11-28 DIAGNOSIS — A599 Trichomoniasis, unspecified: Secondary | ICD-10-CM | POA: Diagnosis not present

## 2013-11-28 DIAGNOSIS — B373 Candidiasis of vulva and vagina: Secondary | ICD-10-CM | POA: Diagnosis not present

## 2013-11-28 DIAGNOSIS — F17200 Nicotine dependence, unspecified, uncomplicated: Secondary | ICD-10-CM | POA: Diagnosis not present

## 2013-11-28 DIAGNOSIS — B9689 Other specified bacterial agents as the cause of diseases classified elsewhere: Secondary | ICD-10-CM

## 2013-11-28 DIAGNOSIS — Z8739 Personal history of other diseases of the musculoskeletal system and connective tissue: Secondary | ICD-10-CM | POA: Diagnosis not present

## 2013-11-28 DIAGNOSIS — R197 Diarrhea, unspecified: Secondary | ICD-10-CM | POA: Diagnosis not present

## 2013-11-28 DIAGNOSIS — N730 Acute parametritis and pelvic cellulitis: Secondary | ICD-10-CM

## 2013-11-28 LAB — URINALYSIS, ROUTINE W REFLEX MICROSCOPIC
Bilirubin Urine: NEGATIVE
Glucose, UA: NEGATIVE mg/dL
Hgb urine dipstick: NEGATIVE
KETONES UR: NEGATIVE mg/dL
NITRITE: NEGATIVE
Protein, ur: NEGATIVE mg/dL
Specific Gravity, Urine: 1.021 (ref 1.005–1.030)
UROBILINOGEN UA: 0.2 mg/dL (ref 0.0–1.0)
pH: 7 (ref 5.0–8.0)

## 2013-11-28 LAB — HIV ANTIBODY (ROUTINE TESTING W REFLEX): HIV 1&2 Ab, 4th Generation: NONREACTIVE

## 2013-11-28 LAB — PREGNANCY, URINE: Preg Test, Ur: NEGATIVE

## 2013-11-28 LAB — WET PREP, GENITAL

## 2013-11-28 LAB — URINE MICROSCOPIC-ADD ON

## 2013-11-28 MED ORDER — CEFTRIAXONE SODIUM 250 MG IJ SOLR
250.0000 mg | Freq: Once | INTRAMUSCULAR | Status: AC
  Administered 2013-11-28: 250 mg via INTRAMUSCULAR
  Filled 2013-11-28: qty 250

## 2013-11-28 MED ORDER — DOXYCYCLINE HYCLATE 100 MG PO CAPS: 100.0000 mg | ORAL_CAPSULE | Freq: Two times a day (BID) | ORAL | Status: DC

## 2013-11-28 MED ORDER — METRONIDAZOLE 500 MG PO TABS: 500.0000 mg | ORAL_TABLET | Freq: Two times a day (BID) | ORAL | Status: DC

## 2013-11-28 MED ORDER — ONDANSETRON HCL 4 MG PO TABS: 4.0000 mg | ORAL_TABLET | Freq: Four times a day (QID) | ORAL | Status: DC

## 2013-11-28 MED ORDER — OXYCODONE-ACETAMINOPHEN 5-325 MG PO TABS: 1.0000 | ORAL_TABLET | Freq: Four times a day (QID) | ORAL | Status: DC | PRN

## 2013-11-28 MED ORDER — FLUCONAZOLE 50 MG PO TABS
150.0000 mg | ORAL_TABLET | Freq: Once | ORAL | Status: AC
  Administered 2013-11-28: 150 mg via ORAL
  Filled 2013-11-28 (×2): qty 1

## 2013-11-28 MED ORDER — IBUPROFEN 800 MG PO TABS
800.0000 mg | ORAL_TABLET | Freq: Once | ORAL | Status: AC
  Administered 2013-11-28: 800 mg via ORAL
  Filled 2013-11-28: qty 1

## 2013-11-28 MED ORDER — METRONIDAZOLE 500 MG PO TABS
2000.0000 mg | ORAL_TABLET | Freq: Once | ORAL | Status: AC
  Administered 2013-11-28: 2000 mg via ORAL
  Filled 2013-11-28: qty 4

## 2013-11-28 MED ORDER — AZITHROMYCIN 250 MG PO TABS
1000.0000 mg | ORAL_TABLET | Freq: Once | ORAL | Status: AC
  Administered 2013-11-28: 1000 mg via ORAL
  Filled 2013-11-28: qty 4

## 2013-11-28 NOTE — ED Provider Notes (Signed)
CSN: [id number]     Arrival date & time 11/28/13  1029 History   First MD Initiated Contact with Patient 11/28/13 1125     Chief Complaint  Patient presents with  . Abdominal Pain     (Consider location/radiation/quality/duration/timing/severity/associated sxs/prior Treatment) Patient is a 28 y.o. female presenting with abdominal pain. The history is provided by the patient.  Abdominal Pain Pain location:  Suprapubic Pain quality: sharp   Pain radiates to:  Does not radiate Pain severity:  Mild Onset quality:  Gradual Duration:  1 week Timing:  Intermittent Progression:  Worsening Chronicity:  New Context comment:  New sexual partner Relieved by:  Nothing Worsened by:  Nothing tried Ineffective treatments:  None tried Associated symptoms: diarrhea, nausea, vaginal discharge and vomiting   Associated symptoms: no chest pain, no cough, no dysuria, no fatigue, no fever, no hematuria and no shortness of breath     Past Medical History  Diagnosis Date  . Rheumatoid arthritis(714.0)   . Herpes genitalia    Past Surgical History  Procedure Laterality Date  . Tubal ligation    . Tonsillectomy    . Cholecystectomy    . Adenoidectomy     No family history on file. History  Substance Use Topics  . Smoking status: Current Some Day Smoker    Types: Cigarettes  . Smokeless tobacco: Not on file  . Alcohol Use: No   OB History   Grav Para Term Preterm Abortions TAB SAB Ect Mult Living                 Review of Systems  Constitutional: Negative for fever and fatigue.  HENT: Negative for congestion and drooling.   Eyes: Negative for pain.  Respiratory: Negative for cough and shortness of breath.   Cardiovascular: Negative for chest pain.  Gastrointestinal: Positive for nausea, vomiting and diarrhea. Negative for abdominal pain.  Genitourinary: Positive for vaginal discharge. Negative for dysuria and hematuria.  Musculoskeletal: Negative for back pain, gait problem and neck  pain.  Skin: Negative for color change.  Neurological: Negative for dizziness and headaches.  Hematological: Negative for adenopathy.  Psychiatric/Behavioral: Negative for behavioral problems.  All other systems reviewed and are negative.     Allergies  Cephalexin and Ketorolac tromethamine  Home Medications   Prior to Admission medications   Not on File   BP 130/80  Pulse 103  Temp(Src) 98.3 F (36.8 C) (Oral)  Resp 18  Ht 5\' 5"  (1.651 m)  Wt 201 lb 1 oz (91.201 kg)  BMI 33.46 kg/m2  SpO2 99%  LMP 11/07/2013 Physical Exam  Nursing note and vitals reviewed. Constitutional: She is oriented to person, place, and time. She appears well-developed and well-nourished.  HENT:  Head: Normocephalic and atraumatic.  Mouth/Throat: Oropharynx is clear and moist. No oropharyngeal exudate.  Mild erythema and swelling to left lower eyelid. (chronic per pt)  Eyes: Conjunctivae and EOM are normal. Pupils are equal, round, and reactive to light.  Neck: Normal range of motion. Neck supple.  Cardiovascular: Normal rate, regular rhythm, normal heart sounds and intact distal pulses.  Exam reveals no gallop and no friction rub.   No murmur heard. Pulmonary/Chest: Effort normal and breath sounds normal. No respiratory distress. She has no wheezes.  Abdominal: Soft. Bowel sounds are normal. There is no tenderness. There is no rebound and no guarding.  Genitourinary:  Normal appearing external vagina. Cervix is mildly friable. Os is closed. Scant amount of white appearing fluid and clumping of white  cheese like substance in the posterior fornix.   Mild CMT. Mild diffuse non-focal ttp during bimanual exam.   Musculoskeletal: Normal range of motion. She exhibits no edema and no tenderness.  Neurological: She is alert and oriented to person, place, and time.  Skin: Skin is warm and dry.  Psychiatric: She has a normal mood and affect. Her behavior is normal.    ED Course  Procedures (including  critical care time) Labs Review Labs Reviewed  URINALYSIS, ROUTINE W REFLEX MICROSCOPIC - Abnormal; Notable for the following:    APPearance CLOUDY (*)    Leukocytes, UA SMALL (*)    All other components within normal limits  URINE MICROSCOPIC-ADD ON - Abnormal; Notable for the following:    Squamous Epithelial / LPF MANY (*)    Bacteria, UA MANY (*)    All other components within normal limits  PREGNANCY, URINE    Imaging Review No results found.   EKG Interpretation None      MDM   Final diagnoses:  PID (acute pelvic inflammatory disease)  Trichomoniasis  BV (bacterial vaginosis)  Vaginal candidiasis    11:40 AM 28 y.o. female pw vag d/c x 2-3 weeks, lower abd pain for 1 week. No fevers. Has had mild vomiting/diarrhea. AFVSS here. Pt concerned for STD. Also using new feminine hygiene products. Hx of gonorrhea, herpes. Will perform pelvic.   1:15 PM: Found to have vag candidiasis, trich, BV. Will tx empirically for STD's. Pt well appearing on exam. Will send home on flagyl for BV and doxy to cover for PID.  I have discussed the diagnosis/risks/treatment options with the patient and believe the pt to be eligible for discharge home to follow-up with her pcp/obgyn. We also discussed returning to the ED immediately if new or worsening sx occur. We discussed the sx which are most concerning (e.g., worsening pain, fever) that necessitate immediate return. Medications administered to the patient during their visit and any new prescriptions provided to the patient are listed below.  Medications given during this visit Medications  ibuprofen (ADVIL,MOTRIN) tablet 800 mg (800 mg Oral Given 11/28/13 1220)  cefTRIAXone (ROCEPHIN) injection 250 mg (250 mg Intramuscular Given 11/28/13 1220)  azithromycin (ZITHROMAX) tablet 1,000 mg (1,000 mg Oral Given 11/28/13 1220)  metroNIDAZOLE (FLAGYL) tablet 2,000 mg (2,000 mg Oral Given 11/28/13 1305)  fluconazole (DIFLUCAN) tablet 150 mg (150 mg Oral  Given 11/28/13 1305)    Discharge Medication List as of 11/28/2013  1:17 PM    START taking these medications   Details  doxycycline (VIBRAMYCIN) 100 MG capsule Take 1 capsule (100 mg total) by mouth 2 (two) times daily. One po bid x 7 days, Starting 11/28/2013, Until Discontinued, Print    metroNIDAZOLE (FLAGYL) 500 MG tablet Take 1 tablet (500 mg total) by mouth 2 (two) times daily. One po bid x 7 days, Starting 11/28/2013, Until Discontinued, Print    ondansetron (ZOFRAN) 4 MG tablet Take 1 tablet (4 mg total) by mouth every 6 (six) hours., Starting 11/28/2013, Until Discontinued, Print    oxyCODONE-acetaminophen (PERCOCET) 5-325 MG per tablet Take 1 tablet by mouth every 6 (six) hours as needed for moderate pain., Starting 11/28/2013, Until Discontinued, Print         Blanchard Kelch, MD 11/29/13 [id number]

## 2013-11-28 NOTE — ED Notes (Signed)
PT discharged to home with family. NAD. 

## 2013-11-28 NOTE — ED Notes (Signed)
Patient here with intermittent lower abdominal pain and cramping since Wednesday. Reports vaginal itching, discharge and vaginal pain x 5 days.

## 2013-11-29 LAB — GC/CHLAMYDIA PROBE AMP
CT PROBE, AMP APTIMA: NEGATIVE
GC Probe RNA: NEGATIVE

## 2013-12-28 ENCOUNTER — Emergency Department (HOSPITAL_BASED_OUTPATIENT_CLINIC_OR_DEPARTMENT_OTHER)
Admission: EM | Admit: 2013-12-28 | Discharge: 2013-12-28 | Disposition: A | Discharge status: Home / Self Care | Payer: Medicaid Other | Attending: Emergency Medicine | Admitting: Emergency Medicine

## 2013-12-28 ENCOUNTER — Encounter (HOSPITAL_BASED_OUTPATIENT_CLINIC_OR_DEPARTMENT_OTHER): Payer: Self-pay | Admitting: Emergency Medicine

## 2013-12-28 DIAGNOSIS — Z79899 Other long term (current) drug therapy: Secondary | ICD-10-CM | POA: Diagnosis not present

## 2013-12-28 DIAGNOSIS — F17200 Nicotine dependence, unspecified, uncomplicated: Secondary | ICD-10-CM | POA: Insufficient documentation

## 2013-12-28 DIAGNOSIS — N739 Female pelvic inflammatory disease, unspecified: Secondary | ICD-10-CM | POA: Insufficient documentation

## 2013-12-28 DIAGNOSIS — Z9851 Tubal ligation status: Secondary | ICD-10-CM | POA: Insufficient documentation

## 2013-12-28 DIAGNOSIS — N898 Other specified noninflammatory disorders of vagina: Secondary | ICD-10-CM | POA: Insufficient documentation

## 2013-12-28 DIAGNOSIS — Z9089 Acquired absence of other organs: Secondary | ICD-10-CM | POA: Diagnosis not present

## 2013-12-28 DIAGNOSIS — B9689 Other specified bacterial agents as the cause of diseases classified elsewhere: Secondary | ICD-10-CM | POA: Diagnosis not present

## 2013-12-28 DIAGNOSIS — Z792 Long term (current) use of antibiotics: Secondary | ICD-10-CM | POA: Insufficient documentation

## 2013-12-28 DIAGNOSIS — M069 Rheumatoid arthritis, unspecified: Secondary | ICD-10-CM | POA: Diagnosis not present

## 2013-12-28 DIAGNOSIS — A499 Bacterial infection, unspecified: Secondary | ICD-10-CM | POA: Diagnosis not present

## 2013-12-28 DIAGNOSIS — N760 Acute vaginitis: Secondary | ICD-10-CM | POA: Insufficient documentation

## 2013-12-28 DIAGNOSIS — N730 Acute parametritis and pelvic cellulitis: Secondary | ICD-10-CM

## 2013-12-28 LAB — WET PREP, GENITAL
Trich, Wet Prep: NONE SEEN
Yeast Wet Prep HPF POC: NONE SEEN

## 2013-12-28 LAB — URINALYSIS, DIPSTICK ONLY
Bilirubin Urine: NEGATIVE
GLUCOSE, UA: NEGATIVE mg/dL
HGB URINE DIPSTICK: NEGATIVE
Ketones, ur: 15 mg/dL — AB
Leukocytes, UA: NEGATIVE
Nitrite: NEGATIVE
PROTEIN: NEGATIVE mg/dL
SPECIFIC GRAVITY, URINE: 1.025 (ref 1.005–1.030)
Urobilinogen, UA: 1 mg/dL (ref 0.0–1.0)
pH: 7 (ref 5.0–8.0)

## 2013-12-28 LAB — PREGNANCY, URINE: Preg Test, Ur: NEGATIVE

## 2013-12-28 LAB — RPR

## 2013-12-28 MED ORDER — METRONIDAZOLE 500 MG PO TABS: 500.0000 mg | ORAL_TABLET | Freq: Two times a day (BID) | ORAL | Status: DC

## 2013-12-28 MED ORDER — DOXYCYCLINE HYCLATE 100 MG PO CAPS: 100.0000 mg | ORAL_CAPSULE | Freq: Two times a day (BID) | ORAL | Status: DC

## 2013-12-28 NOTE — Discharge Instructions (Signed)
Bacterial Vaginosis Bacterial vaginosis is a vaginal infection that occurs when the normal balance of bacteria in the vagina is disrupted. It results from an overgrowth of certain bacteria. This is the most common vaginal infection in women of childbearing age. Treatment is important to prevent complications, especially in pregnant women, as it can cause a premature delivery. CAUSES  Bacterial vaginosis is caused by an increase in harmful bacteria that are normally present in smaller amounts in the vagina. Several different kinds of bacteria can cause bacterial vaginosis. However, the reason that the condition develops is not fully understood. RISK FACTORS Certain activities or behaviors can put you at an increased risk of developing bacterial vaginosis, including:  Having a new sex partner or multiple sex partners.  Douching.  Using an intrauterine device (IUD) for contraception. Women do not get bacterial vaginosis from toilet seats, bedding, swimming pools, or contact with objects around them. SIGNS AND SYMPTOMS  Some women with bacterial vaginosis have no signs or symptoms. Common symptoms include:  Grey vaginal discharge.  A fishlike odor with discharge, especially after sexual intercourse.  Itching or burning of the vagina and vulva.  Burning or pain with urination. DIAGNOSIS  Your health care provider will take a medical history and examine the vagina for signs of bacterial vaginosis. A sample of vaginal fluid may be taken. Your health care provider will look at this sample under a microscope to check for bacteria and abnormal cells. A vaginal pH test may also be done.  TREATMENT  Bacterial vaginosis may be treated with antibiotic medicines. These may be given in the form of a pill or a vaginal cream. A second round of antibiotics may be prescribed if the condition comes back after treatment.  HOME CARE INSTRUCTIONS   Only take over-the-counter or prescription medicines as  directed by your health care provider.  If antibiotic medicine was prescribed, take it as directed. Make sure you finish it even if you start to feel better.  Do not have sex until treatment is completed.  Tell all sexual partners that you have a vaginal infection. They should see their health care provider and be treated if they have problems, such as a mild rash or itching.  Practice safe sex by using condoms and only having one sex partner. SEEK MEDICAL CARE IF:   Your symptoms are not improving after 3 days of treatment.  You have increased discharge or pain.  You have a fever. MAKE SURE YOU:   Understand these instructions.  Will watch your condition.  Will get help right away if you are not doing well or get worse. FOR MORE INFORMATION  Centers for Disease Control and Prevention, Division of STD Prevention: www.cdc.gov/std American Sexual Health Association (ASHA): www.ashastd.org  Document Released: 04/15/2005 Document Revised: 02/03/2013 Document Reviewed: 11/25/2012 Exit[employer] Patient Information 2015 [employer]. This information is not intended to replace advice given to you by your health care provider. Make sure you discuss any questions you have with your health care provider.  

## 2013-12-28 NOTE — ED Provider Notes (Signed)
CSN: [id number]     Arrival date & time 12/28/13  0841 History   First MD Initiated Contact with Patient 12/28/13 [phone]     Chief Complaint  Patient presents with  . Vaginal Discharge     (Consider location/radiation/quality/duration/timing/severity/associated sxs/prior Treatment) HPI Comments: Patient presents with vaginal discharge. She was seen here on August 2 and was diagnosed with Trichomonas and PID. She was given Rocephin in the ED and prescriptions for doxycycline and Flagyl. She states that she was not able to fill the doxycycline and was only able to afford about 3 days of the Flagyl. She states that she still has the same symptoms of vaginal discharge and lower abdominal tenderness. She's had some nausea but no vomiting. She's had some itching and burning in her vaginal area. She denies any urinary symptoms.  Patient is a 28 y.o. female presenting with vaginal discharge.  Vaginal Discharge Associated symptoms: abdominal pain and nausea   Associated symptoms: no fever and no vomiting     Past Medical History  Diagnosis Date  . Rheumatoid arthritis(714.0)   . Herpes genitalia    Past Surgical History  Procedure Laterality Date  . Tubal ligation    . Tonsillectomy    . Cholecystectomy    . Adenoidectomy     No family history on file. History  Substance Use Topics  . Smoking status: Current Some Day Smoker    Types: Cigarettes  . Smokeless tobacco: Not on file  . Alcohol Use: No   OB History   Grav Para Term Preterm Abortions TAB SAB Ect Mult Living                 Review of Systems  Constitutional: Negative for fever, chills, diaphoresis and fatigue.  HENT: Negative for congestion, rhinorrhea and sneezing.   Eyes: Negative.   Respiratory: Negative for cough, chest tightness and shortness of breath.   Cardiovascular: Negative for chest pain and leg swelling.  Gastrointestinal: Positive for nausea and abdominal pain. Negative for vomiting, diarrhea and blood in  stool.  Genitourinary: Positive for vaginal discharge. Negative for frequency, hematuria, flank pain and difficulty urinating.  Musculoskeletal: Negative for arthralgias and back pain.  Skin: Negative for rash.  Neurological: Negative for dizziness, speech difficulty, weakness, numbness and headaches.      Allergies  Cephalexin and Ketorolac tromethamine  Home Medications   Prior to Admission medications   Medication Sig Start Date End Date Taking? Authorizing Provider  doxycycline (VIBRAMYCIN) 100 MG capsule Take 1 capsule (100 mg total) by mouth 2 (two) times daily. One po bid x 7 days 11/28/13   Pamella Pert, MD  doxycycline (VIBRAMYCIN) 100 MG capsule Take 1 capsule (100 mg total) by mouth 2 (two) times daily. One po bid x 7 days 12/28/13   Malvin Johns, MD  metroNIDAZOLE (FLAGYL) 500 MG tablet Take 1 tablet (500 mg total) by mouth 2 (two) times daily. One po bid x 7 days 11/28/13   Pamella Pert, MD  metroNIDAZOLE (FLAGYL) 500 MG tablet Take 1 tablet (500 mg total) by mouth 2 (two) times daily. One po bid x 7 days 12/28/13   Malvin Johns, MD  ondansetron (ZOFRAN) 4 MG tablet Take 1 tablet (4 mg total) by mouth every 6 (six) hours. 11/28/13   Pamella Pert, MD  oxyCODONE-acetaminophen (PERCOCET) 5-325 MG per tablet Take 1 tablet by mouth every 6 (six) hours as needed for moderate pain. 11/28/13   Pamella Pert, MD   BP 111/72  Pulse 76  Temp(Src) 98.4 F (36.9 C) (Oral)  Resp 18  Ht 5\' 5"  (1.651 m)  Wt 205 lb (92.987 kg)  BMI 34.11 kg/m2  SpO2 100%  LMP 12/04/2013 Physical Exam  Constitutional: She is oriented to person, place, and time. She appears well-developed and well-nourished.  HENT:  Head: Normocephalic and atraumatic.  Eyes: Pupils are equal, round, and reactive to light.  Neck: Normal range of motion. Neck supple.  Cardiovascular: Normal rate, regular rhythm and normal heart sounds.   Pulmonary/Chest: Effort normal and breath sounds normal. No respiratory  distress. She has no wheezes. She has no rales. She exhibits no tenderness.  Abdominal: Soft. Bowel sounds are normal. There is tenderness (mild tenderness in the suprapubic area). There is no rebound and no guarding.  Genitourinary: Vaginal discharge found.  Normal external genitalia. There some thick white discharge in vagina. There is mild cervical motion tenderness, no adnexal tenderness  Musculoskeletal: Normal range of motion. She exhibits no edema.  Lymphadenopathy:    She has no cervical adenopathy.  Neurological: She is alert and oriented to person, place, and time.  Skin: Skin is warm and dry. No rash noted.  Psychiatric: She has a normal mood and affect.    ED Course  Procedures (including critical care time) Labs Review Results for orders placed during the hospital encounter of 12/28/13  WET PREP, GENITAL      Result Value Ref Range   Yeast Wet Prep HPF POC NONE SEEN  NONE SEEN   Trich, Wet Prep NONE SEEN  NONE SEEN   Clue Cells Wet Prep HPF POC MANY (*) NONE SEEN   WBC, Wet Prep HPF POC MODERATE (*) NONE SEEN  PREGNANCY, URINE      Result Value Ref Range   Preg Test, Ur NEGATIVE  NEGATIVE  URINALYSIS, DIPSTICK ONLY      Result Value Ref Range   Specific Gravity, Urine 1.025  1.005 - 1.030   pH 7.0  5.0 - 8.0   Glucose, UA NEGATIVE  NEGATIVE mg/dL   Hgb urine dipstick NEGATIVE  NEGATIVE   Bilirubin Urine NEGATIVE  NEGATIVE   Ketones, ur 15 (*) NEGATIVE mg/dL   Protein, ur NEGATIVE  NEGATIVE mg/dL   Urobilinogen, UA 1.0  0.0 - 1.0 mg/dL   Nitrite NEGATIVE  NEGATIVE   Leukocytes, UA NEGATIVE  NEGATIVE   No results found.    Imaging Review No results found.   EKG Interpretation None      MDM   Final diagnoses:  PID (acute pelvic inflammatory disease)  BV (bacterial vaginosis)    Patient did get Rocephin on her last visit. I will go ahead and give her a new prescription for doxycycline and Flagyl. I encouraged her followup with the women's outpatient  clinic if her symptoms are not improving.    Malvin Johns, MD 12/28/13 [phone]

## 2013-12-28 NOTE — ED Notes (Signed)
Vaginal d/c and itching that started 1 month ago.  Was seen in ED, treated for BV but did not complete treatment.  Lower abdominal pian and vaginal odor.

## 2013-12-28 NOTE — ED Notes (Signed)
MD at bedside. 

## 2013-12-29 LAB — GC/CHLAMYDIA PROBE AMP
CT Probe RNA: NEGATIVE
GC Probe RNA: NEGATIVE

## 2014-03-11 ENCOUNTER — Encounter (HOSPITAL_BASED_OUTPATIENT_CLINIC_OR_DEPARTMENT_OTHER): Payer: Self-pay

## 2014-03-11 ENCOUNTER — Emergency Department (HOSPITAL_BASED_OUTPATIENT_CLINIC_OR_DEPARTMENT_OTHER)
Admission: EM | Admit: 2014-03-11 | Discharge: 2014-03-11 | Disposition: A | Discharge status: Home / Self Care | Payer: Medicaid Other | Attending: Emergency Medicine | Admitting: Emergency Medicine

## 2014-03-11 ENCOUNTER — Emergency Department (HOSPITAL_BASED_OUTPATIENT_CLINIC_OR_DEPARTMENT_OTHER): Payer: Medicaid Other

## 2014-03-11 DIAGNOSIS — R109 Unspecified abdominal pain: Secondary | ICD-10-CM

## 2014-03-11 DIAGNOSIS — Z86018 Personal history of other benign neoplasm: Secondary | ICD-10-CM | POA: Insufficient documentation

## 2014-03-11 DIAGNOSIS — Z8742 Personal history of other diseases of the female genital tract: Secondary | ICD-10-CM | POA: Insufficient documentation

## 2014-03-11 DIAGNOSIS — Z792 Long term (current) use of antibiotics: Secondary | ICD-10-CM | POA: Diagnosis not present

## 2014-03-11 DIAGNOSIS — M069 Rheumatoid arthritis, unspecified: Secondary | ICD-10-CM | POA: Diagnosis not present

## 2014-03-11 DIAGNOSIS — R197 Diarrhea, unspecified: Secondary | ICD-10-CM | POA: Insufficient documentation

## 2014-03-11 DIAGNOSIS — Z3202 Encounter for pregnancy test, result negative: Secondary | ICD-10-CM | POA: Diagnosis not present

## 2014-03-11 DIAGNOSIS — Z720 Tobacco use: Secondary | ICD-10-CM | POA: Diagnosis not present

## 2014-03-11 DIAGNOSIS — Z8619 Personal history of other infectious and parasitic diseases: Secondary | ICD-10-CM | POA: Insufficient documentation

## 2014-03-11 DIAGNOSIS — M329 Systemic lupus erythematosus, unspecified: Secondary | ICD-10-CM | POA: Insufficient documentation

## 2014-03-11 DIAGNOSIS — R112 Nausea with vomiting, unspecified: Secondary | ICD-10-CM | POA: Insufficient documentation

## 2014-03-11 DIAGNOSIS — R1031 Right lower quadrant pain: Secondary | ICD-10-CM | POA: Insufficient documentation

## 2014-03-11 DIAGNOSIS — R1030 Lower abdominal pain, unspecified: Secondary | ICD-10-CM | POA: Diagnosis present

## 2014-03-11 HISTORY — DX: Systemic lupus erythematosus, unspecified: M32.9

## 2014-03-11 HISTORY — DX: Reserved for concepts with insufficient information to code with codable children: IMO0002

## 2014-03-11 LAB — URINALYSIS, ROUTINE W REFLEX MICROSCOPIC
BILIRUBIN URINE: NEGATIVE
GLUCOSE, UA: NEGATIVE mg/dL
HGB URINE DIPSTICK: NEGATIVE
KETONES UR: NEGATIVE mg/dL
Leukocytes, UA: NEGATIVE
Nitrite: NEGATIVE
PH: 6.5 (ref 5.0–8.0)
Protein, ur: NEGATIVE mg/dL
Specific Gravity, Urine: 1.013 (ref 1.005–1.030)
Urobilinogen, UA: 0.2 mg/dL (ref 0.0–1.0)

## 2014-03-11 LAB — CBC WITH DIFFERENTIAL/PLATELET
BASOS PCT: 0 % (ref 0–1)
Basophils Absolute: 0 10*3/uL (ref 0.0–0.1)
Eosinophils Absolute: 0.2 10*3/uL (ref 0.0–0.7)
Eosinophils Relative: 4 % (ref 0–5)
HEMATOCRIT: 40.2 % (ref 36.0–46.0)
Hemoglobin: 13.4 g/dL (ref 12.0–15.0)
Lymphocytes Relative: 58 % — ABNORMAL HIGH (ref 12–46)
Lymphs Abs: 2.6 10*3/uL (ref 0.7–4.0)
MCH: 28.3 pg (ref 26.0–34.0)
MCHC: 33.3 g/dL (ref 30.0–36.0)
MCV: 84.8 fL (ref 78.0–100.0)
MONO ABS: 0.5 10*3/uL (ref 0.1–1.0)
MONOS PCT: 11 % (ref 3–12)
Neutro Abs: 1.2 10*3/uL — ABNORMAL LOW (ref 1.7–7.7)
Neutrophils Relative %: 27 % — ABNORMAL LOW (ref 43–77)
Platelets: 238 10*3/uL (ref 150–400)
RBC: 4.74 MIL/uL (ref 3.87–5.11)
RDW: 14.3 % (ref 11.5–15.5)
WBC: 4.5 10*3/uL (ref 4.0–10.5)

## 2014-03-11 LAB — COMPREHENSIVE METABOLIC PANEL
ALT: 18 U/L (ref 0–35)
ANION GAP: 11 (ref 5–15)
AST: 19 U/L (ref 0–37)
Albumin: 3.5 g/dL (ref 3.5–5.2)
Alkaline Phosphatase: 84 U/L (ref 39–117)
BILIRUBIN TOTAL: 0.4 mg/dL (ref 0.3–1.2)
BUN: 7 mg/dL (ref 6–23)
CHLORIDE: 105 meq/L (ref 96–112)
CO2: 24 mEq/L (ref 19–32)
CREATININE: 0.9 mg/dL (ref 0.50–1.10)
Calcium: 9.3 mg/dL (ref 8.4–10.5)
GFR calc Af Amer: >90 mL/min (ref >90)
GFR calc non Af Amer: 87 mL/min — ABNORMAL LOW (ref >90)
Glucose, Bld: 78 mg/dL (ref 70–99)
Potassium: 4.8 mEq/L (ref 3.7–5.3)
Sodium: 140 mEq/L (ref 137–147)
Total Protein: 7.3 g/dL (ref 6.0–8.3)

## 2014-03-11 LAB — WET PREP, GENITAL
Clue Cells Wet Prep HPF POC: NONE SEEN
TRICH WET PREP: NONE SEEN
YEAST WET PREP: NONE SEEN

## 2014-03-11 LAB — PREGNANCY, URINE: Preg Test, Ur: NEGATIVE

## 2014-03-11 LAB — LIPASE, BLOOD: Lipase: 20 U/L (ref 11–59)

## 2014-03-11 MED ORDER — IOHEXOL 300 MG/ML  SOLN
50.0000 mL | Freq: Once | INTRAMUSCULAR | Status: AC | PRN
  Administered 2014-03-11: 50 mL via ORAL

## 2014-03-11 MED ORDER — MORPHINE SULFATE 4 MG/ML IJ SOLN
4.0000 mg | Freq: Once | INTRAMUSCULAR | Status: AC
  Administered 2014-03-11: 4 mg via INTRAVENOUS
  Filled 2014-03-11: qty 1

## 2014-03-11 MED ORDER — HYDROCODONE-ACETAMINOPHEN 5-325 MG PO TABS: 2.0000 | ORAL_TABLET | ORAL | Status: DC | PRN

## 2014-03-11 MED ORDER — ONDANSETRON HCL 4 MG/2ML IJ SOLN
4.0000 mg | Freq: Once | INTRAMUSCULAR | Status: AC
  Administered 2014-03-11: 4 mg via INTRAVENOUS
  Filled 2014-03-11: qty 2

## 2014-03-11 MED ORDER — SODIUM CHLORIDE 0.9 % IV BOLUS (SEPSIS)
1000.0000 mL | Freq: Once | INTRAVENOUS | Status: AC
  Administered 2014-03-11: 1000 mL via INTRAVENOUS

## 2014-03-11 MED ORDER — ONDANSETRON HCL 4 MG PO TABS: 4.0000 mg | ORAL_TABLET | Freq: Four times a day (QID) | ORAL | Status: DC

## 2014-03-11 MED ORDER — IOHEXOL 300 MG/ML  SOLN
100.0000 mL | Freq: Once | INTRAMUSCULAR | Status: AC | PRN
  Administered 2014-03-11: 100 mL via INTRAVENOUS

## 2014-03-11 NOTE — ED Notes (Signed)
Pt tolerating PO fluids and would like to be d/c'ed home. Dr. Wyvonnia Dusky made aware.

## 2014-03-11 NOTE — ED Provider Notes (Signed)
CSN: [id number]     Arrival date & time 03/11/14  1024 History  This chart was scribed for Ezequiel Essex, MD by Ludger Nutting, ED Scribe. This patient was seen in room MH08/MH08 and the patient's care was started 11:54 AM.    Chief Complaint  Patient presents with  . Abdominal Pain   The history is provided by the patient. No language interpreter was used.     HPI Comments: Rhonda Mcgee is a 28 y.o. female who presents to the Emergency Department complaining of 1 week of constant, unchanged lower abdominal pain that radiates to the right lower back. She reports associated nausea and 3-4 episodes of vomiting daily. She also reports having 4 daily episodes of diarrhea. She describes the pain as sharp and shooting; states the pain is worsened by bumps in the road. She was treated with flagyl for BV a few weeks ago. She denies vaginal discharge or vaginal bleeding. LMP was 2 weeks ago.   Past Medical History  Diagnosis Date  . Rheumatoid arthritis(714.0)   . Herpes genitalia   . Lupus    Past Surgical History  Procedure Laterality Date  . Tubal ligation    . Tonsillectomy    . Cholecystectomy    . Adenoidectomy     No family history on file. History  Substance Use Topics  . Smoking status: Current Some Day Smoker    Types: Cigarettes  . Smokeless tobacco: Not on file  . Alcohol Use: Yes     Comment: occasional   OB History    No data available     Review of Systems  A complete 10 system review of systems was obtained and all systems are negative except as noted in the HPI and PMH.    Allergies  Cephalexin and Ketorolac tromethamine  Home Medications   Prior to Admission medications   Medication Sig Start Date End Date Taking? Authorizing Provider  doxycycline (VIBRAMYCIN) 100 MG capsule Take 1 capsule (100 mg total) by mouth 2 (two) times daily. One po bid x 7 days 11/28/13   Pamella Pert, MD  doxycycline (VIBRAMYCIN) 100 MG capsule Take 1 capsule (100 mg total)  by mouth 2 (two) times daily. One po bid x 7 days 12/28/13   Malvin Johns, MD  HYDROcodone-acetaminophen (NORCO/VICODIN) 5-325 MG per tablet Take 2 tablets by mouth every 4 (four) hours as needed. 03/11/14   Ezequiel Essex, MD  metroNIDAZOLE (FLAGYL) 500 MG tablet Take 1 tablet (500 mg total) by mouth 2 (two) times daily. One po bid x 7 days 11/28/13   Pamella Pert, MD  metroNIDAZOLE (FLAGYL) 500 MG tablet Take 1 tablet (500 mg total) by mouth 2 (two) times daily. One po bid x 7 days 12/28/13   Malvin Johns, MD  ondansetron (ZOFRAN) 4 MG tablet Take 1 tablet (4 mg total) by mouth every 6 (six) hours. 03/11/14   Ezequiel Essex, MD  oxyCODONE-acetaminophen (PERCOCET) 5-325 MG per tablet Take 1 tablet by mouth every 6 (six) hours as needed for moderate pain. 11/28/13   Pamella Pert, MD   BP 101/75 mmHg  Pulse 71  Temp(Src) 99.1 F (37.3 C) (Oral)  Resp 16  Ht 5\' 5"  (1.651 m)  Wt 195 lb (88.451 kg)  BMI 32.45 kg/m2  SpO2 100%  LMP 03/10/2014 Physical Exam  Constitutional: She is oriented to person, place, and time. She appears well-developed and well-nourished. No distress.  HENT:  Head: Normocephalic and atraumatic.  Mouth/Throat: Oropharynx is clear and moist. No  oropharyngeal exudate.  Eyes: Conjunctivae and EOM are normal. Pupils are equal, round, and reactive to light. Left eye exhibits no discharge.  Neck: Normal range of motion. Neck supple.  No meningismus.  Cardiovascular: Normal rate, regular rhythm, normal heart sounds and intact distal pulses.   No murmur heard. Pulmonary/Chest: Effort normal and breath sounds normal. No respiratory distress. She exhibits no tenderness.  Abdominal: Soft. There is tenderness. There is no rebound and no guarding.  RLQ and suprapubic tenderness. No guarding or rebound No CVA tenderness   Musculoskeletal: Normal range of motion. She exhibits no edema or tenderness.  Neurological: She is alert and oriented to person, place, and time. No cranial  nerve deficit. She exhibits normal muscle tone. Coordination normal.  No ataxia on finger to nose bilaterally. No pronator drift. 5/5 strength throughout. CN 2-12 intact. Negative Romberg. Equal grip strength. Sensation intact. Gait is normal.   Skin: Skin is warm. No rash noted.  Psychiatric: She has a normal mood and affect. Her behavior is normal.  Nursing note and vitals reviewed.   ED Course  Procedures (including critical care time)  DIAGNOSTIC STUDIES: Oxygen Saturation is 96% on RA, adequate by my interpretation.    COORDINATION OF CARE: 12:04 PM Discussed treatment plan with pt at bedside and pt agreed to plan.   Labs Review Labs Reviewed  WET PREP, GENITAL - Abnormal; Notable for the following:    WBC, Wet Prep HPF POC FEW (*)    All other components within normal limits  CBC WITH DIFFERENTIAL - Abnormal; Notable for the following:    Neutrophils Relative % 27 (*)    Neutro Abs 1.2 (*)    Lymphocytes Relative 58 (*)    All other components within normal limits  COMPREHENSIVE METABOLIC PANEL - Abnormal; Notable for the following:    GFR calc non Af Amer 87 (*)    All other components within normal limits  GC/CHLAMYDIA PROBE AMP  PREGNANCY, URINE  URINALYSIS, ROUTINE W REFLEX MICROSCOPIC  LIPASE, BLOOD    Imaging Review Ct Abdomen Pelvis W Contrast  03/11/2014   CLINICAL DATA:  Right lower quadrant pain for 1 year, recent worsening  EXAM: CT ABDOMEN AND PELVIS WITH CONTRAST  TECHNIQUE: Multidetector CT imaging of the abdomen and pelvis was performed using the standard protocol following bolus administration of intravenous contrast.  CONTRAST:  74mL OMNIPAQUE IOHEXOL 300 MG/ML SOLN, 128mL OMNIPAQUE IOHEXOL 300 MG/ML SOLN  COMPARISON:  09/30/2012  FINDINGS: The lung bases are free of acute infiltrate or sizable effusion. The liver, spleen, pancreas, and adrenal glands are within normal limits. The kidneys are well visualized bilaterally and demonstrate a normal enhancement  pattern. No renal calculi or obstructive changes are seen.  The appendix is well visualized. The colon is predominantly decompressed. No pericolonic inflammatory changes are seen. The bladder is partially distended. The uterus is somewhat heterogeneous similar to that seen on prior ultrasound examination consistent with uterine fibroid change. No free pelvic fluid or lymphadenopathy is noted.  IMPRESSION: Uterine fibroid disease stable from recent ultrasound examination.  No acute abnormality is noted.   Electronically Signed   By: Inez Catalina M.D.   On: 03/11/2014 12:54     EKG Interpretation None      MDM   Final diagnoses:  RLQ abdominal pain  Abdominal pain, unspecified abdominal location   Ten-day history of abdominal pain with nausea, vomiting and diarrhea. No fever. Recent diagnosis of bacterial vaginosis and fibroids. Denies any vaginal bleeding or discharge.  Abdomen soft. Tender in the suprapubic and RLQ.  Ultrasound in March showed evidence of fibroids. Urinalysis is negative. HCG negative.  CT shows normal appendix. Pelvic exam benign. Doubt ovarian torsion.  Tolerating PO in the ED.  NO vomiting.  Symptomatic treatment and followup with PCP. Return precautions discussed.   I personally performed the services described in this documentation, which was scribed in my presence. The recorded information has been reviewed and is accurate.   Ezequiel Essex, MD 03/11/14 [phone]

## 2014-03-11 NOTE — Discharge Instructions (Signed)

## 2014-03-11 NOTE — ED Notes (Signed)
Pt given ginger ale per fluid challenge order. Pt denies nausea at this time.

## 2014-03-11 NOTE — ED Notes (Signed)
Pelvic cart is at the bedside set up and ready for the doctor to use.

## 2014-03-11 NOTE — ED Notes (Signed)
1 week history of abdominal pain radiating to back, urinary frequency.

## 2014-03-12 LAB — GC/CHLAMYDIA PROBE AMP
CT Probe RNA: POSITIVE — AB
GC Probe RNA: NEGATIVE

## 2014-03-13 MED ORDER — AZITHROMYCIN 500 MG PO TABS: 1000.0000 mg | ORAL_TABLET | Freq: Once | ORAL | Status: DC

## 2014-03-17 ENCOUNTER — Telehealth (HOSPITAL_COMMUNITY): Payer: Self-pay | Admitting: *Deleted

## 2014-03-18 ENCOUNTER — Telehealth (HOSPITAL_BASED_OUTPATIENT_CLINIC_OR_DEPARTMENT_OTHER): Payer: Self-pay | Admitting: Emergency Medicine

## 2014-03-18 ENCOUNTER — Encounter (HOSPITAL_BASED_OUTPATIENT_CLINIC_OR_DEPARTMENT_OTHER): Payer: Self-pay | Admitting: *Deleted

## 2014-03-18 ENCOUNTER — Emergency Department (HOSPITAL_BASED_OUTPATIENT_CLINIC_OR_DEPARTMENT_OTHER)
Admission: EM | Admit: 2014-03-18 | Discharge: 2014-03-18 | Disposition: A | Discharge status: Home / Self Care | Payer: Medicaid Other | Attending: Emergency Medicine | Admitting: Emergency Medicine

## 2014-03-18 DIAGNOSIS — Z79899 Other long term (current) drug therapy: Secondary | ICD-10-CM | POA: Insufficient documentation

## 2014-03-18 DIAGNOSIS — Z8739 Personal history of other diseases of the musculoskeletal system and connective tissue: Secondary | ICD-10-CM | POA: Insufficient documentation

## 2014-03-18 DIAGNOSIS — Z792 Long term (current) use of antibiotics: Secondary | ICD-10-CM | POA: Diagnosis not present

## 2014-03-18 DIAGNOSIS — Z720 Tobacco use: Secondary | ICD-10-CM | POA: Diagnosis not present

## 2014-03-18 DIAGNOSIS — R112 Nausea with vomiting, unspecified: Secondary | ICD-10-CM | POA: Diagnosis present

## 2014-03-18 DIAGNOSIS — R109 Unspecified abdominal pain: Secondary | ICD-10-CM | POA: Diagnosis not present

## 2014-03-18 DIAGNOSIS — Z760 Encounter for issue of repeat prescription: Secondary | ICD-10-CM | POA: Diagnosis not present

## 2014-03-18 MED ORDER — AZITHROMYCIN 250 MG PO TABS
1000.0000 mg | ORAL_TABLET | Freq: Once | ORAL | Status: AC
  Administered 2014-03-18: 1000 mg via ORAL
  Filled 2014-03-18: qty 4

## 2014-03-18 MED ORDER — ONDANSETRON 4 MG PO TBDP
4.0000 mg | ORAL_TABLET | Freq: Once | ORAL | Status: AC
Start: 2014-03-18 — End: 2014-03-18
  Administered 2014-03-18: 4 mg via ORAL
  Filled 2014-03-18: qty 1

## 2014-03-18 NOTE — Discharge Instructions (Signed)
Medication Refill, Emergency Department °We have refilled your medication today as a courtesy to you. It is best for your medical care, however, to take care of getting refills done through your primary caregiver's office. They have your records and can do a better job of follow-up than we can in the emergency department. °On maintenance medications, we often only prescribe enough medications to get you by until you are able to see your regular caregiver. This is a more expensive way to refill medications. °In the future, please plan for refills so that you will not have to use the emergency department for this. °Thank you for your help. Your help allows us to better take care of the daily emergencies that enter our department. °Document Released: 08/02/2003 Document Revised: 07/08/2011 Document Reviewed: 07/23/2013 °Exit[employer]® Patient Information ©2015 [employer]. This information is not intended to replace advice given to you by your health care provider. Make sure you discuss any questions you have with your health care provider. ° °

## 2014-03-18 NOTE — ED Provider Notes (Signed)
CSN: [id number]     Arrival date & time 03/18/14  1612 History  This chart was scribed for non-physician practitioner working with Babette Relic, MD by Mercy Moore, ED Scribe. This patient was seen in room MH10/MH10 and the patient's care was started at 6:33 PM.   Chief Complaint  Patient presents with  . Emesis   The history is provided by the patient. No language interpreter was used.   HPI Comments: Rhonda Mcgee is a 28 y.o. female with PMHx of Herpes and lupus who presents to the Emergency Department complaining of emesis which she attributes to medication reaction. Patient reports being treated here 03/11/14 for abdominal pain ongoing for two weeks and she was recently informed that she needed to be treated for an STD. Patient reports picking up a prescription here for her STD yesterday: Zofran and Zithromax. Patient reports vomiting just .5 hours after her first dose and four additional episodes since. Patient reports that she has returned for an alternative medication. Patient reports continued vomiting and abdominal pain; she reports nausea associated with her lupus which has remained unchanged with onset of her abdominal pain and later emesis.         Past Medical History  Diagnosis Date  . Rheumatoid arthritis(714.0)   . Herpes genitalia   . Lupus    Past Surgical History  Procedure Laterality Date  . Tubal ligation    . Tonsillectomy    . Cholecystectomy    . Adenoidectomy     No family history on file. History  Substance Use Topics  . Smoking status: Current Some Day Smoker    Types: Cigarettes  . Smokeless tobacco: Not on file  . Alcohol Use: Yes     Comment: occasional   OB History    No data available     Review of Systems  Constitutional: Negative for fever and chills.  Gastrointestinal: Positive for nausea, vomiting and abdominal pain.    Allergies  Cephalexin and Ketorolac tromethamine  Home Medications   Prior to Admission medications    Medication Sig Start Date End Date Taking? Authorizing Provider  ondansetron (ZOFRAN) 4 MG tablet Take 1 tablet (4 mg total) by mouth every 6 (six) hours. 03/11/14  Yes Ezequiel Essex, MD  azithromycin (ZITHROMAX) 500 MG tablet Take 2 tablets (1,000 mg total) by mouth once. Take first 2 tablets together, then 1 every day until finished. 03/13/14   Blanchie Dessert, MD  doxycycline (VIBRAMYCIN) 100 MG capsule Take 1 capsule (100 mg total) by mouth 2 (two) times daily. One po bid x 7 days 11/28/13   Pamella Pert, MD  doxycycline (VIBRAMYCIN) 100 MG capsule Take 1 capsule (100 mg total) by mouth 2 (two) times daily. One po bid x 7 days 12/28/13   Malvin Johns, MD  HYDROcodone-acetaminophen (NORCO/VICODIN) 5-325 MG per tablet Take 2 tablets by mouth every 4 (four) hours as needed. 03/11/14   Ezequiel Essex, MD  metroNIDAZOLE (FLAGYL) 500 MG tablet Take 1 tablet (500 mg total) by mouth 2 (two) times daily. One po bid x 7 days 11/28/13   Pamella Pert, MD  metroNIDAZOLE (FLAGYL) 500 MG tablet Take 1 tablet (500 mg total) by mouth 2 (two) times daily. One po bid x 7 days 12/28/13   Malvin Johns, MD  oxyCODONE-acetaminophen (PERCOCET) 5-325 MG per tablet Take 1 tablet by mouth every 6 (six) hours as needed for moderate pain. 11/28/13   Pamella Pert, MD   Triage Vitals: BP 118/65 mmHg  Pulse 95  Temp(Src) 98.5 F (36.9 C) (Oral)  Resp 16  Ht 5\' 5"  (1.651 m)  Wt 195 lb (88.451 kg)  BMI 32.45 kg/m2  SpO2 100%  LMP 03/10/2014 Physical Exam  Constitutional: She is oriented to person, place, and time. She appears well-developed and well-nourished. No distress.  HENT:  Head: Normocephalic and atraumatic.  Eyes: EOM are normal.  Neck: Neck supple. No tracheal deviation present.  Cardiovascular: Normal rate.   Pulmonary/Chest: Effort normal. No respiratory distress.  Musculoskeletal: Normal range of motion.  Neurological: She is alert and oriented to person, place, and time.  Skin: Skin is warm  and dry.  Psychiatric: She has a normal mood and affect. Her behavior is normal.  Nursing note and vitals reviewed.   ED Course  Procedures (including critical care time)  COORDINATION OF CARE: 6:33 PM- Discussed treatment plan with patient at bedside and patient agreed to plan.   7:43 PM Pt is here because she vomitted up her zithromax 1,000mg  as treatment for positive cultured chlamydia.  Pt was given zithromax here and tolerates well.  Stable for discharge.    Labs Review Labs Reviewed - No data to display  Imaging Review No results found.   EKG Interpretation None      MDM   Final diagnoses:  Medication refill    BP 114/74 mmHg  Pulse 73  Temp(Src) 98.3 F (36.8 C) (Oral)  Resp 18  Ht 5\' 5"  (1.651 m)  Wt 195 lb (88.451 kg)  BMI 32.45 kg/m2  SpO2 100%  LMP 03/10/2014   I personally performed the services described in this documentation, which was scribed in my presence. The recorded information has been reviewed and is accurate.    Domenic Moras, PA-C 03/18/14 [place], MD 03/24/14 [phone]

## 2014-03-18 NOTE — ED Notes (Signed)
Pt reports she was here yesterday to pick up an rx for STD treatment. She vomited 30 minutes after taking the medication, and has vomited 4 times since.

## 2014-04-06 ENCOUNTER — Encounter (HOSPITAL_BASED_OUTPATIENT_CLINIC_OR_DEPARTMENT_OTHER): Payer: Self-pay

## 2014-04-06 ENCOUNTER — Emergency Department (HOSPITAL_BASED_OUTPATIENT_CLINIC_OR_DEPARTMENT_OTHER)
Admission: EM | Admit: 2014-04-06 | Discharge: 2014-04-06 | Disposition: A | Discharge status: Home / Self Care | Payer: Medicaid Other | Attending: Emergency Medicine | Admitting: Emergency Medicine

## 2014-04-06 DIAGNOSIS — Z8619 Personal history of other infectious and parasitic diseases: Secondary | ICD-10-CM | POA: Insufficient documentation

## 2014-04-06 DIAGNOSIS — N898 Other specified noninflammatory disorders of vagina: Secondary | ICD-10-CM | POA: Insufficient documentation

## 2014-04-06 DIAGNOSIS — Z3202 Encounter for pregnancy test, result negative: Secondary | ICD-10-CM | POA: Insufficient documentation

## 2014-04-06 DIAGNOSIS — Z8739 Personal history of other diseases of the musculoskeletal system and connective tissue: Secondary | ICD-10-CM | POA: Insufficient documentation

## 2014-04-06 DIAGNOSIS — Z720 Tobacco use: Secondary | ICD-10-CM | POA: Insufficient documentation

## 2014-04-06 DIAGNOSIS — R1030 Lower abdominal pain, unspecified: Secondary | ICD-10-CM | POA: Diagnosis present

## 2014-04-06 LAB — URINALYSIS, ROUTINE W REFLEX MICROSCOPIC
BILIRUBIN URINE: NEGATIVE
Glucose, UA: NEGATIVE mg/dL
Hgb urine dipstick: NEGATIVE
KETONES UR: NEGATIVE mg/dL
Leukocytes, UA: NEGATIVE
Nitrite: NEGATIVE
PROTEIN: NEGATIVE mg/dL
Specific Gravity, Urine: 1.016 (ref 1.005–1.030)
UROBILINOGEN UA: 1 mg/dL (ref 0.0–1.0)
pH: 5.5 (ref 5.0–8.0)

## 2014-04-06 LAB — PREGNANCY, URINE: Preg Test, Ur: NEGATIVE

## 2014-04-06 MED ORDER — AZITHROMYCIN 250 MG PO TABS: 1000.0000 mg | ORAL_TABLET | Freq: Once | ORAL | Status: DC

## 2014-04-06 MED ORDER — FLUCONAZOLE 150 MG PO TABS: 150.0000 mg | ORAL_TABLET | Freq: Once | ORAL | Status: DC

## 2014-04-06 MED ORDER — DOXYCYCLINE HYCLATE 100 MG PO CAPS: 100.0000 mg | ORAL_CAPSULE | Freq: Two times a day (BID) | ORAL | Status: DC

## 2014-04-06 NOTE — Discharge Instructions (Signed)
Pelvic Infection ° °If you have been diagnosed with a pelvic infection such as a sexually transmitted disease, you will need to be treated with antibiotics. Please take the medicines as prescribed. Some of these tests do not come back for 1-2 days in which case if they turn positive you will receive a phone call to let you know. If you are contacted and do have an infection consistent with a sexually transmitted disease, then you will need to tell any and all sexual partners that you have had in the last 6 months no so that they can be tested and treated as well. If you should develop severe or worsening pain in your abdomen or the pelvis or develop severe fevers,nausea or vomiting that prevent you from taking your medications, return to the emergency department immediately. Otherwise contact your local physician or county health department for a follow up appointment to complete STD testing including HIV and syphilis.  See the list of phone numbers below. ° °RESOURCE GUIDE ° °Dental Problems ° °Patients with Medicaid: °[facility]                     [facility] °[address]                                           [address] °Phone:  [phone]                                                  Phone:  [phone] ° °If unable to pay or uninsured, contact:  [facility] or [facility] to become qualified for the adult dental clinic. ° °Chronic Pain Problems °Contact [facility]  [phone] °Patients need to be referred by their primary care doctor. ° °Insufficient Money for Medicine °Contact [employer]:  call "211" or [facility] [phone]. ° °No Primary Care Doctor °Call [facility]  832-8000 °Other agencies that provide inexpensive medical care °   [facility]  [phone] °   [facility]  [phone] °   [facility]  [phone] °   [facility]  [phone] °   [facility]  [phone] ° [facility]  [phone] ° °Psychological Services °[facility]  [phone] °[facility]  [phone] °[facility]   [phone] (emergency services [phone]) ° °Substance Abuse Resources °[facility]  336-882-2125 °[facility] 336-784-9470 °[facility] 336-285-9073 °[facility] 336-845-3988 °Residential & Outpatient Substance Abuse Program  800-659-3381 ° °Abuse/Neglect °[place] [facility] (336) 641-3795 °[place] [facility] Hot[facility] 800-378-5315 (After Hours) ° °Emergency Shelter °[facility] (336) 271-5985 ° °Maternity Homes °[facility] (336) 275-9566 °[facility] (704) 372-4663 ° °MRSA Hotline #:   832-7006 ° ° ° °[place] [facility] ° °[facility] [facility]     [employer]                          [facility] °[address] [place]                         [address]      [address]  °[place]                                                [place]                            [place] °Phone:  349-3220                                   Phone:  [phone]                 Phone:  [phone] ° °[facility] °Phone:  342-8316 ° °[place] [facility] Hotline °(336) 342-1394 °(336) 342-3537 (After Hours) ° ° ° °

## 2014-04-06 NOTE — ED Provider Notes (Signed)
CSN: [id number]     Arrival date & time 04/06/14  1104 History   First MD Initiated Contact with Patient 04/06/14 1125     Chief Complaint  Patient presents with  . Abdominal Pain     (Consider location/radiation/quality/duration/timing/severity/associated sxs/prior Treatment) HPI Comments: The patient is a 28 year old female, recently treated for STDs including chlamydia presents after having sexual contact with her prior significant other who she is unsure if got tested, she also thinks that he is cheating on her. She has recurrent vaginal discharge and lower abdominal cramping with some burning. Symptoms are very similar to what they were before, no fevers chills and no nausea or vomiting.  Patient is a 28 y.o. female presenting with abdominal pain. The history is provided by the patient and medical records.  Abdominal Pain   Past Medical History  Diagnosis Date  . Rheumatoid arthritis(714.0)   . Herpes genitalia   . Lupus    Past Surgical History  Procedure Laterality Date  . Tubal ligation    . Tonsillectomy    . Cholecystectomy    . Adenoidectomy     No family history on file. History  Substance Use Topics  . Smoking status: Current Some Day Smoker    Types: Cigarettes  . Smokeless tobacco: Not on file  . Alcohol Use: Yes     Comment: occasional   OB History    No data available     Review of Systems  Gastrointestinal: Positive for abdominal pain.  All other systems reviewed and are negative.     Allergies  Cephalexin  Home Medications   Prior to Admission medications   Medication Sig Start Date End Date Taking? Authorizing Provider  azithromycin (ZITHROMAX) 250 MG tablet Take 4 tablets (1,000 mg total) by mouth once. Take first 2 tablets together, then 1 every day until finished. 04/06/14   Johnna Acosta, MD  doxycycline (VIBRAMYCIN) 100 MG capsule Take 1 capsule (100 mg total) by mouth 2 (two) times daily. 04/06/14   Johnna Acosta, MD  fluconazole  (DIFLUCAN) 150 MG tablet Take 1 tablet (150 mg total) by mouth once. 04/06/14   Johnna Acosta, MD   BP 139/95 mmHg  Pulse 103  Temp(Src) 98 F (36.7 C) (Oral)  Resp 16  Ht 5\' 5"  (1.651 m)  Wt 192 lb (87.091 kg)  BMI 31.95 kg/m2  SpO2 98%  LMP 03/30/2014 Physical Exam  Constitutional: She appears well-developed and well-nourished. No distress.  HENT:  Head: Normocephalic and atraumatic.  Mouth/Throat: Oropharynx is clear and moist. No oropharyngeal exudate.  Eyes: Conjunctivae and EOM are normal. Pupils are equal, round, and reactive to light. Right eye exhibits no discharge. Left eye exhibits no discharge. No scleral icterus.  Neck: Normal range of motion. Neck supple. No JVD present. No thyromegaly present.  Cardiovascular: Normal rate, regular rhythm, normal heart sounds and intact distal pulses.  Exam reveals no gallop and no friction rub.   No murmur heard. Pulmonary/Chest: Effort normal and breath sounds normal. No respiratory distress. She has no wheezes. She has no rales.  Abdominal: Soft. Bowel sounds are normal. She exhibits no distension and no mass. There is no tenderness.  Genitourinary:  Patient declines pelvic exam  Musculoskeletal: Normal range of motion. She exhibits no edema or tenderness.  Lymphadenopathy:    She has no cervical adenopathy.  Neurological: She is alert. Coordination normal.  Skin: Skin is warm and dry. No rash noted. No erythema.  Psychiatric: She has a normal  mood and affect. Her behavior is normal.  Nursing note and vitals reviewed.   ED Course  Procedures (including critical care time) Labs Review Labs Reviewed  URINALYSIS, ROUTINE W REFLEX MICROSCOPIC - Abnormal; Notable for the following:    APPearance CLOUDY (*)    All other components within normal limits  PREGNANCY, URINE    Imaging Review No results found.    MDM   Final diagnoses:  Vaginal discharge    She has a very soft abdomen with no lower abdominal tenderness,  urinalysis is normal, nonpregnant, recurrent vaginal discharge as she is with the same partner, will reinitiate treatment for chlamydia, medications as below, patient in agreement, will follow-up for STD testing and inform partners.   Meds given in ED:  Medications - No data to display  New Prescriptions   AZITHROMYCIN (ZITHROMAX) 250 MG TABLET    Take 4 tablets (1,000 mg total) by mouth once. Take first 2 tablets together, then 1 every day until finished.   DOXYCYCLINE (VIBRAMYCIN) 100 MG CAPSULE    Take 1 capsule (100 mg total) by mouth 2 (two) times daily.   FLUCONAZOLE (DIFLUCAN) 150 MG TABLET    Take 1 tablet (150 mg total) by mouth once.        Johnna Acosta, MD 04/06/14 [phone]

## 2014-04-06 NOTE — ED Notes (Signed)
Pt states she was treated for STD recently-sexually active with same female that was not treated-c/o abd "burning like last time" with foul smelling vaginal d/c

## 2014-07-27 ENCOUNTER — Encounter (HOSPITAL_BASED_OUTPATIENT_CLINIC_OR_DEPARTMENT_OTHER): Payer: Self-pay | Admitting: *Deleted

## 2014-07-27 ENCOUNTER — Emergency Department (HOSPITAL_BASED_OUTPATIENT_CLINIC_OR_DEPARTMENT_OTHER): Payer: Medicaid Other

## 2014-07-27 ENCOUNTER — Emergency Department (HOSPITAL_BASED_OUTPATIENT_CLINIC_OR_DEPARTMENT_OTHER)
Admission: EM | Admit: 2014-07-27 | Discharge: 2014-07-27 | Disposition: A | Discharge status: Home / Self Care | Payer: Medicaid Other | Attending: Emergency Medicine | Admitting: Emergency Medicine

## 2014-07-27 DIAGNOSIS — Z3202 Encounter for pregnancy test, result negative: Secondary | ICD-10-CM | POA: Diagnosis not present

## 2014-07-27 DIAGNOSIS — R1030 Lower abdominal pain, unspecified: Secondary | ICD-10-CM | POA: Insufficient documentation

## 2014-07-27 DIAGNOSIS — Z8739 Personal history of other diseases of the musculoskeletal system and connective tissue: Secondary | ICD-10-CM | POA: Diagnosis not present

## 2014-07-27 DIAGNOSIS — Z792 Long term (current) use of antibiotics: Secondary | ICD-10-CM | POA: Insufficient documentation

## 2014-07-27 DIAGNOSIS — Z8619 Personal history of other infectious and parasitic diseases: Secondary | ICD-10-CM | POA: Insufficient documentation

## 2014-07-27 DIAGNOSIS — Z720 Tobacco use: Secondary | ICD-10-CM | POA: Diagnosis not present

## 2014-07-27 DIAGNOSIS — R1032 Left lower quadrant pain: Secondary | ICD-10-CM | POA: Insufficient documentation

## 2014-07-27 DIAGNOSIS — R102 Pelvic and perineal pain: Secondary | ICD-10-CM | POA: Diagnosis not present

## 2014-07-27 DIAGNOSIS — R1031 Right lower quadrant pain: Secondary | ICD-10-CM | POA: Diagnosis present

## 2014-07-27 LAB — URINALYSIS, ROUTINE W REFLEX MICROSCOPIC
BILIRUBIN URINE: NEGATIVE
GLUCOSE, UA: NEGATIVE mg/dL
Hgb urine dipstick: NEGATIVE
KETONES UR: NEGATIVE mg/dL
Leukocytes, UA: NEGATIVE
NITRITE: NEGATIVE
PROTEIN: NEGATIVE mg/dL
Specific Gravity, Urine: 1.007 (ref 1.005–1.030)
Urobilinogen, UA: 0.2 mg/dL (ref 0.0–1.0)
pH: 6.5 (ref 5.0–8.0)

## 2014-07-27 LAB — CBC WITH DIFFERENTIAL/PLATELET
BASOS ABS: 0 10*3/uL (ref 0.0–0.1)
BASOS PCT: 0 % (ref 0–1)
EOS PCT: 3 % (ref 0–5)
Eosinophils Absolute: 0.2 10*3/uL (ref 0.0–0.7)
HCT: 37.6 % (ref 36.0–46.0)
Hemoglobin: 12.4 g/dL (ref 12.0–15.0)
LYMPHS PCT: 51 % — AB (ref 12–46)
Lymphs Abs: 2.9 10*3/uL (ref 0.7–4.0)
MCH: 28.3 pg (ref 26.0–34.0)
MCHC: 33 g/dL (ref 30.0–36.0)
MCV: 85.8 fL (ref 78.0–100.0)
Monocytes Absolute: 0.5 10*3/uL (ref 0.1–1.0)
Monocytes Relative: 10 % (ref 3–12)
NEUTROS ABS: 2 10*3/uL (ref 1.7–7.7)
NEUTROS PCT: 36 % — AB (ref 43–77)
PLATELETS: 249 10*3/uL (ref 150–400)
RBC: 4.38 MIL/uL (ref 3.87–5.11)
RDW: 13.7 % (ref 11.5–15.5)
WBC: 5.6 10*3/uL (ref 4.0–10.5)

## 2014-07-27 LAB — WET PREP, GENITAL
Trich, Wet Prep: NONE SEEN
WBC, Wet Prep HPF POC: NONE SEEN
Yeast Wet Prep HPF POC: NONE SEEN

## 2014-07-27 LAB — PREGNANCY, URINE: Preg Test, Ur: NEGATIVE

## 2014-07-27 MED ORDER — AZITHROMYCIN 250 MG PO TABS
1000.0000 mg | ORAL_TABLET | Freq: Once | ORAL | Status: AC
  Administered 2014-07-27: 1000 mg via ORAL
  Filled 2014-07-27: qty 4

## 2014-07-27 MED ORDER — ONDANSETRON 4 MG PO TBDP
4.0000 mg | ORAL_TABLET | Freq: Once | ORAL | Status: AC
  Administered 2014-07-27: 4 mg via ORAL

## 2014-07-27 MED ORDER — ONDANSETRON 4 MG PO TBDP
ORAL_TABLET | ORAL | Status: AC
  Filled 2014-07-27: qty 1

## 2014-07-27 MED ORDER — HYDROCODONE-ACETAMINOPHEN 5-325 MG PO TABS: 1.0000 | ORAL_TABLET | Freq: Four times a day (QID) | ORAL | Status: DC | PRN

## 2014-07-27 NOTE — ED Provider Notes (Signed)
CSN: [id number]     Arrival date & time 07/27/14  1645 History   First MD Initiated Contact with Patient 07/27/14 1659     Chief Complaint  Patient presents with  . Abdominal Pain     (Consider location/radiation/quality/duration/timing/severity/associated sxs/prior Treatment) HPI Comments: Patient is a 29 year old female with history of cholecystectomy and tubal ligation. She presents for evaluation of lower abdominal discomfort which has been worsening over the past several weeks. She was seen by her PCP and was told she likely had bacterial vaginosis. She was given Flagyl, however her symptoms have worsened. She reports slight discharge which is malodorous. She denies any abnormal bleeding. Her last period was last week and normal.  Patient is a 29 y.o. female presenting with abdominal pain. The history is provided by the patient.  Abdominal Pain Pain location:  Suprapubic Pain quality: cramping   Pain radiates to:  Does not radiate Pain severity:  Moderate Onset quality:  Gradual Duration:  3 weeks Timing:  Constant Progression:  Worsening Chronicity:  New Relieved by:  Nothing Worsened by:  Nothing tried Ineffective treatments: flagyl.   Past Medical History  Diagnosis Date  . Rheumatoid arthritis(714.0)   . Herpes genitalia   . Lupus    Past Surgical History  Procedure Laterality Date  . Tubal ligation    . Tonsillectomy    . Cholecystectomy    . Adenoidectomy     No family history on file. History  Substance Use Topics  . Smoking status: Current Some Day Smoker    Types: Cigarettes  . Smokeless tobacco: Not on file  . Alcohol Use: Yes     Comment: occasional   OB History    No data available     Review of Systems  Gastrointestinal: Positive for abdominal pain.  All other systems reviewed and are negative.     Allergies  Cephalexin  Home Medications   Prior to Admission medications   Medication Sig Start Date End Date Taking? Authorizing  Provider  azithromycin (ZITHROMAX) 250 MG tablet Take 4 tablets (1,000 mg total) by mouth once. Take first 2 tablets together, then 1 every day until finished. 04/06/14   Noemi Chapel, MD  doxycycline (VIBRAMYCIN) 100 MG capsule Take 1 capsule (100 mg total) by mouth 2 (two) times daily. 04/06/14   Noemi Chapel, MD  fluconazole (DIFLUCAN) 150 MG tablet Take 1 tablet (150 mg total) by mouth once. 04/06/14   Noemi Chapel, MD   BP 136/74 mmHg  Pulse 90  Temp(Src) 98.2 F (36.8 C) (Oral)  Resp 20  Ht 5\' 5"  (1.651 m)  Wt 191 lb 5 oz (86.779 kg)  BMI 31.84 kg/m2  SpO2 98%  LMP 07/18/2014 Physical Exam  Constitutional: She is oriented to person, place, and time. She appears well-developed and well-nourished. No distress.  HENT:  Head: Normocephalic and atraumatic.  Neck: Normal range of motion. Neck supple.  Cardiovascular: Normal rate and regular rhythm.  Exam reveals no gallop and no friction rub.   No murmur heard. Pulmonary/Chest: Effort normal and breath sounds normal. No respiratory distress. She has no wheezes.  Abdominal: Soft. Bowel sounds are normal. She exhibits no distension. There is tenderness. There is no rebound and no guarding.  There is tenderness to palpation in the left lower, right lower, and suprapubic regions.  Musculoskeletal: Normal range of motion.  Neurological: She is alert and oriented to person, place, and time.  Skin: Skin is warm and dry. She is not diaphoretic.  Nursing note  and vitals reviewed.   ED Course  Procedures (including critical care time) Labs Review Labs Reviewed  WET PREP, GENITAL  URINALYSIS, ROUTINE W REFLEX MICROSCOPIC  PREGNANCY, URINE  CBC WITH DIFFERENTIAL/PLATELET  HIV ANTIBODY (ROUTINE TESTING)  GC/CHLAMYDIA PROBE AMP ([facility])    Imaging Review No results found.    MDM   Final diagnoses:  None    Patient presents with lower abdominal discomfort for the past week. She was seen by her primary Dr. told that she likely  had bacterial vaginosis. She has taken Flagyl for one week with no improvement. Pelvic examination is essentially unremarkable and wet prep reveals only few clue cells. Ultrasound was obtained which reveals no acute intrapelvic process. She is sexually active with one partner, however questions his fidelity. Gonorrhea and Chlamydia cultures are pending, however the patient will be treated prophylactically for presumed PID. She has an allergy to Keflex so she will be treated with 2 g of Zithromax. She will be notified if her cultures indicate that she requires further treatment.    Veryl Speak, MD 07/27/14 [phone]

## 2014-07-27 NOTE — ED Notes (Addendum)
Pt reports two weeks of sharp left lower abdominal pain, intermittent n/v/d. Pt was seen at her pcp when the pain started, given flagyl and has completed dose without symptom improvement.

## 2014-07-27 NOTE — Discharge Instructions (Signed)
Hydrocodone as prescribed as needed for pain.  We will call you if your cultures indicate you require further treatment.   Pelvic Pain Female pelvic pain can be caused by many different things and start from a variety of places. Pelvic pain refers to pain that is located in the lower half of the abdomen and between your hips. The pain may occur over a short period of time (acute) or may be reoccurring (chronic). The cause of pelvic pain may be related to disorders affecting the female reproductive organs (gynecologic), but it may also be related to the bladder, kidney stones, an intestinal complication, or muscle or skeletal problems. Getting help right away for pelvic pain is important, especially if there has been severe, sharp, or a sudden onset of unusual pain. It is also important to get help right away because some types of pelvic pain can be life threatening.  CAUSES  Below are only some of the causes of pelvic pain. The causes of pelvic pain can be in one of several categories.   Gynecologic.  Pelvic inflammatory disease.  Sexually transmitted infection.  Ovarian cyst or a twisted ovarian ligament (ovarian torsion).  Uterine lining that grows outside the uterus (endometriosis).  Fibroids, cysts, or tumors.  Ovulation.  Pregnancy.  Pregnancy that occurs outside the uterus (ectopic pregnancy).  Miscarriage.  Labor.  Abruption of the placenta or ruptured uterus.  Infection.  Uterine infection (endometritis).  Bladder infection.  Diverticulitis.  Miscarriage related to a uterine infection (septic abortion).  Bladder.  Inflammation of the bladder (cystitis).  Kidney stone(s).  Gastrointestinal.  Constipation.  Diverticulitis.  Neurologic.  Trauma.  Feeling pelvic pain because of mental or emotional causes (psychosomatic).  Cancers of the bowel or pelvis. EVALUATION  Your caregiver will want to take a careful history of your concerns. This includes  recent changes in your health, a careful gynecologic history of your periods (menses), and a sexual history. Obtaining your family history and medical history is also important. Your caregiver may suggest a pelvic exam. A pelvic exam will help identify the location and severity of the pain. It also helps in the evaluation of which organ system may be involved. In order to identify the cause of the pelvic pain and be properly treated, your caregiver may order tests. These tests may include:   A pregnancy test.  Pelvic ultrasonography.  An X-ray exam of the abdomen.  A urinalysis or evaluation of vaginal discharge.  Blood tests. HOME CARE INSTRUCTIONS   Only take over-the-counter or prescription medicines for pain, discomfort, or fever as directed by your caregiver.   Rest as directed by your caregiver.   Eat a balanced diet.   Drink enough fluids to make your urine clear or pale yellow, or as directed.   Avoid sexual intercourse if it causes pain.   Apply warm or cold compresses to the lower abdomen depending on which one helps the pain.   Avoid stressful situations.   Keep a journal of your pelvic pain. Write down when it started, where the pain is located, and if there are things that seem to be associated with the pain, such as food or your menstrual cycle.  Follow up with your caregiver as directed.  SEEK MEDICAL CARE IF:  Your medicine does not help your pain.  You have abnormal vaginal discharge. SEEK IMMEDIATE MEDICAL CARE IF:   You have heavy bleeding from the vagina.   Your pelvic pain increases.   You feel light-headed or faint.  You have chills.   You have pain with urination or blood in your urine.   You have uncontrolled diarrhea or vomiting.   You have a fever or persistent symptoms for more than 3 days.  You have a fever and your symptoms suddenly get worse.   You are being physically or sexually abused.  MAKE SURE  YOU:  Understand these instructions.  Will watch your condition.  Will get help if you are not doing well or get worse. Document Released: 03/12/2004 Document Revised: 08/30/2013 Document Reviewed: 08/05/2011 [facility] Patient Information 2015 [place], [place]. This information is not intended to replace advice given to you by your health care provider. Make sure you discuss any questions you have with your health care provider.

## 2014-07-28 LAB — GC/CHLAMYDIA PROBE AMP (~~LOC~~) NOT AT ARMC
CHLAMYDIA, DNA PROBE: NEGATIVE
NEISSERIA GONORRHEA: NEGATIVE

## 2014-07-29 LAB — HIV ANTIBODY (ROUTINE TESTING W REFLEX): HIV Screen 4th Generation wRfx: NONREACTIVE

## 2015-09-04 ENCOUNTER — Encounter (HOSPITAL_BASED_OUTPATIENT_CLINIC_OR_DEPARTMENT_OTHER): Payer: Self-pay | Admitting: *Deleted

## 2015-09-04 ENCOUNTER — Emergency Department (HOSPITAL_BASED_OUTPATIENT_CLINIC_OR_DEPARTMENT_OTHER): Payer: Medicaid Other

## 2015-09-04 ENCOUNTER — Emergency Department (HOSPITAL_BASED_OUTPATIENT_CLINIC_OR_DEPARTMENT_OTHER)
Admission: EM | Admit: 2015-09-04 | Discharge: 2015-09-04 | Disposition: A | Discharge status: Home / Self Care | Payer: Medicaid Other | Attending: Emergency Medicine | Admitting: Emergency Medicine

## 2015-09-04 DIAGNOSIS — M0600 Rheumatoid arthritis without rheumatoid factor, unspecified site: Secondary | ICD-10-CM | POA: Diagnosis not present

## 2015-09-04 DIAGNOSIS — R102 Pelvic and perineal pain: Secondary | ICD-10-CM | POA: Diagnosis not present

## 2015-09-04 DIAGNOSIS — F17210 Nicotine dependence, cigarettes, uncomplicated: Secondary | ICD-10-CM | POA: Diagnosis not present

## 2015-09-04 DIAGNOSIS — R109 Unspecified abdominal pain: Secondary | ICD-10-CM | POA: Diagnosis present

## 2015-09-04 LAB — URINE MICROSCOPIC-ADD ON: WBC, UA: NONE SEEN WBC/hpf (ref 0–5)

## 2015-09-04 LAB — URINALYSIS, ROUTINE W REFLEX MICROSCOPIC
BILIRUBIN URINE: NEGATIVE
Glucose, UA: NEGATIVE mg/dL
KETONES UR: NEGATIVE mg/dL
Leukocytes, UA: NEGATIVE
NITRITE: NEGATIVE
PROTEIN: NEGATIVE mg/dL
Specific Gravity, Urine: 1.018 (ref 1.005–1.030)
pH: 6 (ref 5.0–8.0)

## 2015-09-04 LAB — WET PREP, GENITAL
CLUE CELLS WET PREP: NONE SEEN
SPERM: NONE SEEN
Trich, Wet Prep: NONE SEEN
Yeast Wet Prep HPF POC: NONE SEEN

## 2015-09-04 LAB — PREGNANCY, URINE: PREG TEST UR: NEGATIVE

## 2015-09-04 MED ORDER — IBUPROFEN 800 MG PO TABS: 800.0000 mg | ORAL_TABLET | Freq: Three times a day (TID) | ORAL | Status: DC

## 2015-09-04 MED ORDER — KETOROLAC TROMETHAMINE 60 MG/2ML IM SOLN
60.0000 mg | Freq: Once | INTRAMUSCULAR | Status: AC
  Administered 2015-09-04: 60 mg via INTRAMUSCULAR
  Filled 2015-09-04: qty 2

## 2015-09-04 NOTE — ED Notes (Signed)
Pt reports abdominal pain and vaginal swelling. Requesting testing for STDs. Reports she has found out her Bf has had multiple other sexual contacts

## 2015-09-04 NOTE — Discharge Instructions (Signed)
Your testing did not reveal any obvious infection You will be contacted if you should need antibiotics for STD's or if you have syphillis or gonorrhea.  Come back to the ER immediately for severe or worsening pain / fever / vomiting

## 2015-09-04 NOTE — ED Notes (Signed)
Patient transported to Ultrasound 

## 2015-09-04 NOTE — ED Provider Notes (Signed)
CSN: [id number]     Arrival date & time 09/04/15  [id number] History   First MD Initiated Contact with Patient 09/04/15 1022     Chief Complaint  Patient presents with  . Abdominal Pain     (Consider location/radiation/quality/duration/timing/severity/associated sxs/prior Treatment) HPI Comments: 30 year old female, she is obese, she does have a history of STDs including gonorrhea a couple years ago with the same partner who she believes now has given her another STD. She also has a history of genital herpes, lupus and rheumatoid arthritis. She reports that over the last couple of days she has had increasing abdominal discomfort, vaginal swelling, itching and discharge as well as increased vaginal bleeding. She has had a prior tubal ligation, she is still with the same partner who gave her gonorrhea several years ago and now reports that he has had several other partners which she has recently discovered and thinks that she may have contracted another sexually transmitted disease. There is no fevers chills nausea vomiting or back pain.  Patient is a 30 y.o. female presenting with abdominal pain. The history is provided by the patient.  Abdominal Pain   Past Medical History  Diagnosis Date  . Rheumatoid arthritis(714.0)   . Herpes genitalia   . Lupus [facility])    Past Surgical History  Procedure Laterality Date  . Tubal ligation    . Tonsillectomy    . Cholecystectomy    . Adenoidectomy     No family history on file. Social History  Substance Use Topics  . Smoking status: Current Some Day Smoker    Types: Cigarettes  . Smokeless tobacco: Never Used  . Alcohol Use: Yes     Comment: occasional   OB History    No data available     Review of Systems  Gastrointestinal: Positive for abdominal pain.  All other systems reviewed and are negative.     Allergies  Cephalexin  Home Medications   Prior to Admission medications   Medication Sig Start Date End Date Taking? Authorizing  Provider  azithromycin (ZITHROMAX) 250 MG tablet Take 4 tablets (1,000 mg total) by mouth once. Take first 2 tablets together, then 1 every day until finished. 04/06/14   Noemi Chapel, MD  doxycycline (VIBRAMYCIN) 100 MG capsule Take 1 capsule (100 mg total) by mouth 2 (two) times daily. 04/06/14   Noemi Chapel, MD  fluconazole (DIFLUCAN) 150 MG tablet Take 1 tablet (150 mg total) by mouth once. 04/06/14   Noemi Chapel, MD  HYDROcodone-acetaminophen (NORCO) 5-325 MG per tablet Take 1-2 tablets by mouth every 6 (six) hours as needed. 07/27/14   Veryl Speak, MD  ibuprofen (ADVIL,MOTRIN) 800 MG tablet Take 1 tablet (800 mg total) by mouth 3 (three) times daily. 09/04/15   Noemi Chapel, MD   BP 134/74 mmHg  Pulse 75  Temp(Src) 99.1 F (37.3 C) (Oral)  Resp 18  Ht 5\' 5"  (1.651 m)  Wt 212 lb (96.163 kg)  BMI 35.28 kg/m2  SpO2 100%  LMP 08/20/2015 Physical Exam  Constitutional: She appears well-developed and well-nourished. No distress.  HENT:  Head: Normocephalic and atraumatic.  Mouth/Throat: Oropharynx is clear and moist. No oropharyngeal exudate.  Eyes: Conjunctivae and EOM are normal. Pupils are equal, round, and reactive to light. Right eye exhibits no discharge. Left eye exhibits no discharge. No scleral icterus.  Neck: Normal range of motion. Neck supple. No JVD present. No thyromegaly present.  Cardiovascular: Normal rate, regular rhythm, normal heart sounds and intact distal pulses.  Exam  reveals no gallop and no friction rub.   No murmur heard. Pulmonary/Chest: Effort normal and breath sounds normal. No respiratory distress. She has no wheezes. She has no rales.  Abdominal: Soft. Bowel sounds are normal. She exhibits no distension and no mass. There is no tenderness.  Genitourinary:  Chaperone present for exam, blood in the vaginal vault, no injury, no trauma, no laceration, tenderness over the bilateral labia, no cervical motion tenderness, no right adnexal tenderness or masses, there is  mild left adnexal tenderness with a fullness.  Musculoskeletal: Normal range of motion. She exhibits no edema or tenderness.  Lymphadenopathy:    She has no cervical adenopathy.  Neurological: She is alert. Coordination normal.  Skin: Skin is warm and dry. No rash noted. No erythema.  Psychiatric: She has a normal mood and affect. Her behavior is normal.  Nursing note and vitals reviewed.   ED Course  Procedures (including critical care time) Labs Review Labs Reviewed  WET PREP, GENITAL - Abnormal; Notable for the following:    WBC, Wet Prep HPF POC FEW (*)    All other components within normal limits  URINALYSIS, ROUTINE W REFLEX MICROSCOPIC (NOT AT [facility]) - Abnormal; Notable for the following:    Hgb urine dipstick LARGE (*)    All other components within normal limits  URINE MICROSCOPIC-ADD ON - Abnormal; Notable for the following:    Squamous Epithelial / LPF 0-5 (*)    Bacteria, UA FEW (*)    All other components within normal limits  PREGNANCY, URINE  RPR  HIV ANTIBODY (ROUTINE TESTING)  GC/CHLAMYDIA PROBE AMP ([place]) NOT AT [facility]    Imaging Review US Transvaginal Non-ob  09/04/2015  CLINICAL DATA:  Pelvic pain for 2-3 days.  Vaginal bleeding. EXAM: TRANSABDOMINAL AND TRANSVAGINAL ULTRASOUND OF PELVIS TECHNIQUE: Both transabdominal and transvaginal ultrasound examinations of the pelvis were performed. Transabdominal technique was performed for global imaging of the pelvis including uterus, ovaries, adnexal regions, and pelvic cul-de-sac. It was necessary to proceed with endovaginal exam following the transabdominal exam to visualize the uterus, endometrium and ovaries. COMPARISON:  03/01/2015 FINDINGS: Uterus Measurements: 8.9 x 4.4 x 4.8 cm. Again noted is a retroverted uterus. Uterine echotexture is within normal limits. No large fibroids appreciated on this examination. Endometrium Thickness: 0.7 cm.  No focal abnormality visualized. Right ovary Measurements: 3.0 x 2.3 x  2.3 cm. Normal appearance/no adnexal mass. Left ovary Measurements: 3.3 x 1.7 x 2.5 cm. Normal appearance/no adnexal mass. Other findings No abnormal free fluid. IMPRESSION: No acute abnormality. Electronically Signed   By: Markus Daft M.D.   On: 09/04/2015 12:03   US Pelvis Complete  09/04/2015  CLINICAL DATA:  Pelvic pain for 2-3 days.  Vaginal bleeding. EXAM: TRANSABDOMINAL AND TRANSVAGINAL ULTRASOUND OF PELVIS TECHNIQUE: Both transabdominal and transvaginal ultrasound examinations of the pelvis were performed. Transabdominal technique was performed for global imaging of the pelvis including uterus, ovaries, adnexal regions, and pelvic cul-de-sac. It was necessary to proceed with endovaginal exam following the transabdominal exam to visualize the uterus, endometrium and ovaries. COMPARISON:  03/01/2015 FINDINGS: Uterus Measurements: 8.9 x 4.4 x 4.8 cm. Again noted is a retroverted uterus. Uterine echotexture is within normal limits. No large fibroids appreciated on this examination. Endometrium Thickness: 0.7 cm.  No focal abnormality visualized. Right ovary Measurements: 3.0 x 2.3 x 2.3 cm. Normal appearance/no adnexal mass. Left ovary Measurements: 3.3 x 1.7 x 2.5 cm. Normal appearance/no adnexal mass. Other findings No abnormal free fluid. IMPRESSION: No acute abnormality.  Electronically Signed   By: Markus Daft M.D.   On: 09/04/2015 12:03   I have personally reviewed and evaluated these images and lab results as part of my medical decision-making.    MDM   Final diagnoses:  Pelvic pain in female    The patient has unilateral pelvic tenderness and fullness, ultrasound pending, labs pending, no signs of purulent discharge  W/u neg - pt feeling well - denies much pain in pelvis - no signs of herpes on exam - pt informed of results pending.  Agreeable to return if worsens.  Doubt appy.  [place] without findings of TOA / torsion or cyst.  Meds given in ED:  Medications  ketorolac (TORADOL)  injection 60 mg (60 mg Intramuscular Given 09/04/15 1124)    New Prescriptions   IBUPROFEN (ADVIL,MOTRIN) 800 MG TABLET    Take 1 tablet (800 mg total) by mouth 3 (three) times daily.       Noemi Chapel, MD 09/04/15 1258

## 2015-09-05 LAB — RPR: RPR Ser Ql: REACTIVE — AB

## 2015-09-05 LAB — HIV ANTIBODY (ROUTINE TESTING W REFLEX): HIV SCREEN 4TH GENERATION: NONREACTIVE

## 2015-09-05 LAB — GC/CHLAMYDIA PROBE AMP (~~LOC~~) NOT AT ARMC
CHLAMYDIA, DNA PROBE: NEGATIVE
NEISSERIA GONORRHEA: NEGATIVE

## 2015-09-05 LAB — RPR, QUANT+TP ABS (REFLEX)
Rapid Plasma Reagin, Quant: 1:1 {titer} — ABNORMAL HIGH
T Pallidum Abs: NEGATIVE

## 2015-09-06 ENCOUNTER — Telehealth (HOSPITAL_BASED_OUTPATIENT_CLINIC_OR_DEPARTMENT_OTHER): Payer: Self-pay | Admitting: Emergency Medicine

## 2015-09-06 NOTE — Telephone Encounter (Signed)
Chart handoff to EDP for treatment plan for + RPR titer 1:1

## 2015-09-07 ENCOUNTER — Telehealth: Payer: Self-pay | Admitting: *Deleted

## 2015-09-07 ENCOUNTER — Encounter (HOSPITAL_BASED_OUTPATIENT_CLINIC_OR_DEPARTMENT_OTHER): Payer: Self-pay | Admitting: Emergency Medicine

## 2015-09-07 ENCOUNTER — Emergency Department (HOSPITAL_BASED_OUTPATIENT_CLINIC_OR_DEPARTMENT_OTHER)
Admission: EM | Admit: 2015-09-07 | Discharge: 2015-09-07 | Disposition: A | Discharge status: Home / Self Care | Payer: Medicaid Other | Attending: Emergency Medicine | Admitting: Emergency Medicine

## 2015-09-07 DIAGNOSIS — F17210 Nicotine dependence, cigarettes, uncomplicated: Secondary | ICD-10-CM | POA: Insufficient documentation

## 2015-09-07 DIAGNOSIS — M069 Rheumatoid arthritis, unspecified: Secondary | ICD-10-CM | POA: Diagnosis not present

## 2015-09-07 DIAGNOSIS — A64 Unspecified sexually transmitted disease: Secondary | ICD-10-CM | POA: Diagnosis present

## 2015-09-07 MED ORDER — FLUCONAZOLE 150 MG PO TABS: 150.0000 mg | ORAL_TABLET | Freq: Once | ORAL | Status: DC

## 2015-09-07 MED ORDER — ONDANSETRON 4 MG PO TBDP
4.0000 mg | ORAL_TABLET | Freq: Once | ORAL | Status: AC
  Administered 2015-09-07: 4 mg via ORAL
  Filled 2015-09-07: qty 1

## 2015-09-07 MED ORDER — METRONIDAZOLE 500 MG PO TABS
2000.0000 mg | ORAL_TABLET | Freq: Once | ORAL | Status: AC
  Administered 2015-09-07: 2000 mg via ORAL
  Filled 2015-09-07: qty 4

## 2015-09-07 MED ORDER — PENICILLIN G BENZATHINE 1200000 UNIT/2ML IM SUSP
2.4000 10*6.[IU] | Freq: Once | INTRAMUSCULAR | Status: AC
  Administered 2015-09-07: 2.4 10*6.[IU] via INTRAMUSCULAR
  Filled 2015-09-07: qty 4

## 2015-09-07 MED ORDER — AZITHROMYCIN 1 G PO PACK
1.0000 g | PACK | Freq: Once | ORAL | Status: AC
  Administered 2015-09-07: 1 g via ORAL
  Filled 2015-09-07: qty 1

## 2015-09-07 MED FILL — FLUCONAZOLE 150 MG TABLET: 150 | 1 days supply | Qty: 1 | Fill #0

## 2015-09-07 NOTE — ED Provider Notes (Signed)
CSN: [id number]     Arrival date & time 09/07/15  1033 History   First MD Initiated Contact with Patient 09/07/15 1039     Chief Complaint  Patient presents with  . SEXUALLY TRANSMITTED DISEASE     (Consider location/radiation/quality/duration/timing/severity/associated sxs/prior Treatment) HPI Patient states she returns to the emergency department today because she was called regarding positive STD results. She had concern for exposure from her partner who was known to have had sexual intercourse with several other people and she suspects STD exposure. She has had vaginal discharge of thick and foul smelling drainage as well as itching. Past Medical History  Diagnosis Date  . Rheumatoid arthritis(714.0)   . Herpes genitalia   . Lupus [facility])    Past Surgical History  Procedure Laterality Date  . Tubal ligation    . Tonsillectomy    . Cholecystectomy    . Adenoidectomy     History reviewed. No pertinent family history. Social History  Substance Use Topics  . Smoking status: Current Some Day Smoker    Types: Cigarettes  . Smokeless tobacco: Never Used  . Alcohol Use: Yes     Comment: occasional   OB History    No data available     Review of Systems 10 Systems reviewed and are negative for acute change except as noted in the HPI.    Allergies  Cephalexin  Home Medications   Prior to Admission medications   Medication Sig Start Date End Date Taking? Authorizing Provider  fluconazole (DIFLUCAN) 150 MG tablet Take 1 tablet (150 mg total) by mouth once. Take 5/18 days if symptoms are still persisting. 09/07/15   Charlesetta Shanks, MD   BP 119/77 mmHg  Pulse 76  Temp(Src) 98.6 F (37 C) (Oral)  Resp 20  SpO2 100%  LMP 08/20/2015 Physical Exam  Constitutional: She is oriented to person, place, and time. She appears well-developed and well-nourished. No distress.  HENT:  Head: Normocephalic and atraumatic.  Eyes: EOM are normal.  Pulmonary/Chest: Effort normal.   Musculoskeletal: Normal range of motion.  Neurological: She is alert and oriented to person, place, and time. Coordination normal.  Psychiatric: She has a normal mood and affect.    ED Course  Procedures (including critical care time) Labs Review Labs Reviewed - No data to display  Imaging Review No results found. I have personally reviewed and evaluated these images and lab results as part of my medical decision-making.   EKG Interpretation None      MDM   Final diagnoses:  STD (female)   Patient has an RPR return positive. Patient has concern for STD exposure from a partner who had previously infected her. At this time she will be treated with IM penicillin. She reports that she does not have allergy and she has been treated with penicillin previously. She states her reaction to cephalexin occurred while she was pregnant and was not a severe reaction. Patient is alert and nontoxic. Vital signs are stable.    Charlesetta Shanks, MD 09/07/15 [phone]

## 2015-09-07 NOTE — Discharge Instructions (Signed)
Sexually Transmitted Disease °A sexually transmitted disease (STD) is a disease or infection that may be passed (transmitted) from person to person, usually during sexual activity. This may happen by way of saliva, semen, blood, vaginal mucus, or urine. Common STDs include: °· Gonorrhea. °· Chlamydia. °· Syphilis. °· HIV and AIDS. °· Genital herpes. °· Hepatitis B and C. °· Trichomonas. °· Human papillomavirus (HPV). °· Pubic lice. °· Scabies. °· Mites. °· Bacterial vaginosis. °WHAT ARE CAUSES OF STDs? °An STD may be caused by bacteria, a virus, or parasites. STDs are often transmitted during sexual activity if one person is infected. However, they may also be transmitted through nonsexual means. STDs may be transmitted after:  °· Sexual intercourse with an infected person. °· Sharing sex toys with an infected person. °· Sharing needles with an infected person or using unclean piercing or tattoo needles. °· Having intimate contact with the genitals, mouth, or rectal areas of an infected person. °· Exposure to infected fluids during birth. °WHAT ARE THE SIGNS AND SYMPTOMS OF STDs? °Different STDs have different symptoms. Some people may not have any symptoms. If symptoms are present, they may include: °· Painful or bloody urination. °· Pain in the pelvis, abdomen, vagina, anus, throat, or eyes. °· A skin rash, itching, or irritation. °· Growths, ulcerations, blisters, or sores in the genital and anal areas. °· Abnormal vaginal discharge with or without bad odor. °· Penile discharge in men. °· Fever. °· Pain or bleeding during sexual intercourse. °· Swollen glands in the groin area. °· Yellow skin and eyes (jaundice). This is seen with hepatitis. °· Swollen testicles. °· Infertility. °· Sores and blisters in the mouth. °HOW ARE STDs DIAGNOSED? °To make a diagnosis, your health care provider may: °· Take a medical history. °· Perform a physical exam. °· Take a sample of any discharge to examine. °· Swab the throat,  cervix, opening to the penis, rectum, or vagina for testing. °· Test a sample of your first morning urine. °· Perform blood tests. °· Perform a Pap test, if this applies. °· Perform a colposcopy. °· Perform a laparoscopy. °HOW ARE STDs TREATED? °Treatment depends on the STD. Some STDs may be treated but not cured. °· Chlamydia, gonorrhea, trichomonas, and syphilis can be cured with antibiotic medicine. °· Genital herpes, hepatitis, and HIV can be treated, but not cured, with prescribed medicines. The medicines lessen symptoms. °· Genital warts from HPV can be treated with medicine or by freezing, burning (electrocautery), or surgery. Warts may come back. °· HPV cannot be cured with medicine or surgery. However, abnormal areas may be removed from the cervix, vagina, or vulva. °· If your diagnosis is confirmed, your recent sexual partners need treatment. This is true even if they are symptom-free or have a negative culture or evaluation. They should not have sex until their health care providers say it is okay. °· Your health care provider may test you for infection again 3 months after treatment. °HOW CAN I REDUCE MY RISK OF GETTING AN STD? °Take these steps to reduce your risk of getting an STD: °· Use latex condoms, dental dams, and water-soluble lubricants during sexual activity. Do not use petroleum jelly or oils. °· Avoid having multiple sex partners. °· Do not have sex with someone who has other sex partners °· Do not have sex with anyone you do not know or who is at high risk for an STD. °· Avoid risky sex practices that can break your skin. °· Do not have sex   if you have open sores on your mouth or skin. °· Avoid drinking too much alcohol or taking illegal drugs. Alcohol and drugs can affect your judgment and put you in a vulnerable position. °· Avoid engaging in oral and anal sex acts. °· Get vaccinated for HPV and hepatitis. If you have not received these vaccines in the past, talk to your health care  provider about whether one or both might be right for you. °· If you are at risk of being infected with HIV, it is recommended that you take a prescription medicine daily to prevent HIV infection. This is called pre-exposure prophylaxis (PrEP). You are considered at risk if: °¨ You are a man who has sex with other men (MSM). °¨ You are a heterosexual man or woman and are sexually active with more than one partner. °¨ You take drugs by injection. °¨ You are sexually active with a partner who has HIV. °· Talk with your health care provider about whether you are at high risk of being infected with HIV. If you choose to begin PrEP, you should first be tested for HIV. You should then be tested every 3 months for as long as you are taking PrEP. °WHAT SHOULD I DO IF I THINK I HAVE AN STD? °· See your health care provider. °· Tell your sexual partner(s). They should be tested and treated for any STDs. °· Do not have sex until your health care provider says it is okay. °WHEN SHOULD I GET IMMEDIATE MEDICAL CARE? °Contact your health care provider right away if:  °· You have severe abdominal pain. °· You are a man and notice swelling or pain in your testicles. °· You are a woman and notice swelling or pain in your vagina. °  °This information is not intended to replace advice given to you by your health care provider. Make sure you discuss any questions you have with your health care provider. °  °Document Released: 07/06/2002 Document Revised: 05/06/2014 Document Reviewed: 11/03/2012 °Elsevier Interactive Patient Education ©2016 [employer] ° °

## 2015-09-07 NOTE — ED Notes (Signed)
Pt returning for treatment for + rpr

## 2015-11-11 ENCOUNTER — Telehealth: Payer: Self-pay | Admitting: *Deleted

## 2015-11-11 NOTE — Telephone Encounter (Signed)
(+)  RPR advised by EDP to return for PEN G 2.4 mil units IM.  No response to phone call or letter sent to home. No further treatment received.

## 2016-02-25 ENCOUNTER — Emergency Department (HOSPITAL_BASED_OUTPATIENT_CLINIC_OR_DEPARTMENT_OTHER): Payer: Self-pay

## 2016-02-25 ENCOUNTER — Encounter (HOSPITAL_BASED_OUTPATIENT_CLINIC_OR_DEPARTMENT_OTHER): Payer: Self-pay | Admitting: *Deleted

## 2016-02-25 ENCOUNTER — Emergency Department (HOSPITAL_BASED_OUTPATIENT_CLINIC_OR_DEPARTMENT_OTHER)
Admission: EM | Admit: 2016-02-25 | Discharge: 2016-02-25 | Disposition: A | Discharge status: Home / Self Care | Payer: Self-pay | Attending: Emergency Medicine | Admitting: Emergency Medicine

## 2016-02-25 DIAGNOSIS — R102 Pelvic and perineal pain unspecified side: Secondary | ICD-10-CM

## 2016-02-25 DIAGNOSIS — R197 Diarrhea, unspecified: Secondary | ICD-10-CM | POA: Insufficient documentation

## 2016-02-25 DIAGNOSIS — N739 Female pelvic inflammatory disease, unspecified: Secondary | ICD-10-CM | POA: Insufficient documentation

## 2016-02-25 DIAGNOSIS — N730 Acute parametritis and pelvic cellulitis: Secondary | ICD-10-CM

## 2016-02-25 DIAGNOSIS — R1030 Lower abdominal pain, unspecified: Secondary | ICD-10-CM

## 2016-02-25 DIAGNOSIS — F17210 Nicotine dependence, cigarettes, uncomplicated: Secondary | ICD-10-CM | POA: Insufficient documentation

## 2016-02-25 DIAGNOSIS — R112 Nausea with vomiting, unspecified: Secondary | ICD-10-CM

## 2016-02-25 LAB — WET PREP, GENITAL
Sperm: NONE SEEN
Yeast Wet Prep HPF POC: NONE SEEN

## 2016-02-25 LAB — URINALYSIS, ROUTINE W REFLEX MICROSCOPIC
Bilirubin Urine: NEGATIVE
Glucose, UA: NEGATIVE mg/dL
HGB URINE DIPSTICK: NEGATIVE
Ketones, ur: NEGATIVE mg/dL
NITRITE: NEGATIVE
PROTEIN: NEGATIVE mg/dL
Specific Gravity, Urine: 1.021 (ref 1.005–1.030)
pH: 6.5 (ref 5.0–8.0)

## 2016-02-25 LAB — CBC WITH DIFFERENTIAL/PLATELET
Basophils Absolute: 0 10*3/uL (ref 0.0–0.1)
Basophils Relative: 1 %
EOS PCT: 2 %
Eosinophils Absolute: 0.1 10*3/uL (ref 0.0–0.7)
HCT: 37.3 % (ref 36.0–46.0)
HEMOGLOBIN: 12.6 g/dL (ref 12.0–15.0)
LYMPHS ABS: 2.4 10*3/uL (ref 0.7–4.0)
LYMPHS PCT: 54 %
MCH: 28.1 pg (ref 26.0–34.0)
MCHC: 33.8 g/dL (ref 30.0–36.0)
MCV: 83.3 fL (ref 78.0–100.0)
Monocytes Absolute: 0.6 10*3/uL (ref 0.1–1.0)
Monocytes Relative: 13 %
NEUTROS PCT: 30 %
Neutro Abs: 1.3 10*3/uL — ABNORMAL LOW (ref 1.7–7.7)
Platelets: 268 10*3/uL (ref 150–400)
RBC: 4.48 MIL/uL (ref 3.87–5.11)
RDW: 14.1 % (ref 11.5–15.5)
WBC: 4.4 10*3/uL (ref 4.0–10.5)

## 2016-02-25 LAB — COMPREHENSIVE METABOLIC PANEL
ALK PHOS: 70 U/L (ref 38–126)
ALT: 19 U/L (ref 14–54)
AST: 22 U/L (ref 15–41)
Albumin: 3.8 g/dL (ref 3.5–5.0)
Anion gap: 6 (ref 5–15)
BUN: 8 mg/dL (ref 6–20)
CALCIUM: 9 mg/dL (ref 8.9–10.3)
CO2: 22 mmol/L (ref 22–32)
CREATININE: 0.86 mg/dL (ref 0.44–1.00)
Chloride: 107 mmol/L (ref 101–111)
Glucose, Bld: 79 mg/dL (ref 65–99)
Potassium: 4.3 mmol/L (ref 3.5–5.1)
Sodium: 135 mmol/L (ref 135–145)
Total Bilirubin: 0.9 mg/dL (ref 0.3–1.2)
Total Protein: 7.4 g/dL (ref 6.5–8.1)

## 2016-02-25 LAB — URINE MICROSCOPIC-ADD ON

## 2016-02-25 LAB — I-STAT CG4 LACTIC ACID, ED: LACTIC ACID, VENOUS: 1.34 mmol/L (ref 0.5–1.9)

## 2016-02-25 LAB — LIPASE, BLOOD: LIPASE: 23 U/L (ref 11–51)

## 2016-02-25 LAB — PREGNANCY, URINE: Preg Test, Ur: NEGATIVE

## 2016-02-25 MED ORDER — HYDROCODONE-ACETAMINOPHEN 5-325 MG PO TABS: 1.0000 | ORAL_TABLET | ORAL | 0 refills | Status: DC | PRN

## 2016-02-25 MED ORDER — ONDANSETRON HCL 4 MG PO TABS: 4.0000 mg | ORAL_TABLET | Freq: Three times a day (TID) | ORAL | 0 refills | Status: DC | PRN

## 2016-02-25 MED ORDER — ONDANSETRON HCL 4 MG/2ML IJ SOLN
INTRAMUSCULAR | Status: AC
  Administered 2016-02-25: 4 mg
  Filled 2016-02-25: qty 2

## 2016-02-25 MED ORDER — ONDANSETRON HCL 4 MG/2ML IJ SOLN
4.0000 mg | Freq: Once | INTRAMUSCULAR | Status: AC
  Administered 2016-02-25: 4 mg via INTRAVENOUS
  Filled 2016-02-25: qty 2

## 2016-02-25 MED ORDER — DOXYCYCLINE HYCLATE 100 MG PO CAPS: 100.0000 mg | ORAL_CAPSULE | Freq: Two times a day (BID) | ORAL | 0 refills | Status: AC

## 2016-02-25 MED ORDER — CEFTRIAXONE SODIUM 250 MG IJ SOLR
250.0000 mg | Freq: Once | INTRAMUSCULAR | Status: AC
  Administered 2016-02-25: 250 mg via INTRAMUSCULAR
  Filled 2016-02-25: qty 250

## 2016-02-25 MED ORDER — LIDOCAINE HCL (PF) 1 % IJ SOLN
INTRAMUSCULAR | Status: AC
  Administered 2016-02-25: 5 mL
  Filled 2016-02-25: qty 5

## 2016-02-25 MED ORDER — METRONIDAZOLE 500 MG PO TABS
500.0000 mg | ORAL_TABLET | Freq: Once | ORAL | Status: AC
  Administered 2016-02-25: 500 mg via ORAL
  Filled 2016-02-25: qty 1

## 2016-02-25 MED ORDER — MORPHINE SULFATE (PF) 4 MG/ML IV SOLN
4.0000 mg | Freq: Once | INTRAVENOUS | Status: AC
  Administered 2016-02-25: 4 mg via INTRAVENOUS
  Filled 2016-02-25: qty 1

## 2016-02-25 MED ORDER — DOXYCYCLINE HYCLATE 100 MG PO TABS
100.0000 mg | ORAL_TABLET | Freq: Once | ORAL | Status: AC
  Administered 2016-02-25: 100 mg via ORAL
  Filled 2016-02-25: qty 1

## 2016-02-25 MED ORDER — SODIUM CHLORIDE 0.9 % IV BOLUS (SEPSIS)
1000.0000 mL | Freq: Once | INTRAVENOUS | Status: AC
  Administered 2016-02-25: 1000 mL via INTRAVENOUS

## 2016-02-25 MED ORDER — METRONIDAZOLE 500 MG PO TABS: 500.0000 mg | ORAL_TABLET | Freq: Two times a day (BID) | ORAL | 0 refills | Status: AC

## 2016-02-25 NOTE — ED Notes (Signed)
Pt given d/c instructions as per chart. Rx x 4. Verbalizes understanding. No questions. 

## 2016-02-25 NOTE — ED Triage Notes (Signed)
Patient states that she was seen back in September in the ER [place] and was informed that she had an STD and was prescribed medicine. She states that she did not get the prescriptions. She c/o pelvic burning and also congestion and sore throat.

## 2016-02-25 NOTE — ED Provider Notes (Signed)
[facility] Provider Note   CSN: [id number] Arrival date & time: 02/25/16  0810     History   Chief Complaint Chief Complaint  Patient presents with  . Abdominal Pain    HPI Rhonda Mcgee is a 30 y.o. female with a past medical history significant for STI who presents with abdominal/pelvic pain, nausea, vomiting, vaginal discharge, dysuria, and diarrhea. Patient reports that she has a history of previous sexual transmitted infections for which she has been treated however, she says one month ago, she was out of town and had intercourse with a new individual. She reports that she began having discharge at that time and went to an outside ED. She reports that she was treated with antibiotics and then was called after returned to [place] saying that she had chlamydia and gonorrhea. She says that she was unable to afford antibiotics and was unable to go to the [facility] due to work. She has not taken treatment for this new diagnosis. She says that over the last several weeks and worsening over the last few days she has had worsening pelvic pain. She describes the pain in her very lower abdomen on the left and middle. She says that she is having burning with urination, a yellow discharge from her vagina, and has had diarrhea for the last few days. She denies constipation. She denies any fevers or chills but does report an episode of nausea and vomiting earlier. She describes her pain as 8 out of 10 severity, nonradiating, and is not responding to over-the-counter medications. She says the pain is cramping and sharp. She has not had this pain before. She has no history of tubo-ovarian abscess or other problems with her ovaries. She has had a previous tubal ligation.     The history is provided by the patient and medical records. No language interpreter was used.  Abdominal Pain   This is a new problem. The current episode started more than 1 week ago. The problem  occurs constantly. The problem has been gradually worsening. The pain is located in the LLQ and suprapubic region. The quality of the pain is aching, cramping and sharp. The pain is at a severity of 8/10. Associated symptoms include diarrhea, nausea and dysuria. Pertinent negatives include fever, melena, vomiting, constipation, frequency, hematuria and headaches. The symptoms are aggravated by palpation. Nothing relieves the symptoms.  Vaginal Discharge   This is a recurrent problem. The current episode started more than 1 week ago. The problem occurs constantly. The problem has been gradually worsening. The discharge was thick and yellow. She is not pregnant. Associated symptoms include abdominal pain, diarrhea, nausea and dysuria. Pertinent negatives include no diaphoresis, no fever, no constipation, no vomiting and no frequency. She has tried nothing for the symptoms. Her past medical history is significant for STD.    Past Medical History:  Diagnosis Date  . Herpes genitalia   . Lupus   . Rheumatoid arthritis(714.0)     There are no active problems to display for this patient.   Past Surgical History:  Procedure Laterality Date  . ADENOIDECTOMY    . CHOLECYSTECTOMY    . TONSILLECTOMY    . TUBAL LIGATION      OB History    No data available       Home Medications    Prior to Admission medications   Medication Sig Start Date End Date Taking? Authorizing Provider  fluconazole (DIFLUCAN) 150 MG tablet Take 1 tablet (150 mg  total) by mouth once. Take 5/18 days if symptoms are still persisting. 09/07/15   Charlesetta Shanks, MD    Family History History reviewed. No pertinent family history.  Social History Social History  Substance Use Topics  . Smoking status: Current Some Day Smoker    Types: Cigarettes  . Smokeless tobacco: Never Used  . Alcohol use Yes     Comment: occasional     Allergies   Cephalexin   Review of Systems Review of Systems  Constitutional:  Negative for activity change, chills, diaphoresis, fatigue and fever.  HENT: Negative for congestion and rhinorrhea.   Eyes: Negative for visual disturbance.  Respiratory: Negative for cough, chest tightness, shortness of breath and stridor.   Cardiovascular: Negative for chest pain, palpitations and leg swelling.  Gastrointestinal: Positive for abdominal pain, diarrhea and nausea. Negative for abdominal distention, constipation, melena and vomiting.  Genitourinary: Positive for dysuria, pelvic pain and vaginal discharge. Negative for difficulty urinating, flank pain, frequency, hematuria, menstrual problem and vaginal bleeding.  Musculoskeletal: Negative for back pain and neck pain.  Skin: Negative for rash and wound.  Neurological: Negative for dizziness, seizures, weakness, light-headedness, numbness and headaches.  Psychiatric/Behavioral: Negative for agitation and confusion.  All other systems reviewed and are negative.    Physical Exam Updated Vital Signs BP 120/77 (BP Location: Right Arm)   Pulse 95   Temp 98.4 F (36.9 C) (Oral)   Resp 18   SpO2 100%   Physical Exam  Constitutional: She is oriented to person, place, and time. She appears well-developed and well-nourished. No distress.  HENT:  Head: Normocephalic and atraumatic.  Mouth/Throat: Oropharynx is clear and moist. No oropharyngeal exudate.  Eyes: Conjunctivae are normal.  Neck: Neck supple.  Cardiovascular: Normal rate and regular rhythm.   No murmur heard. Pulmonary/Chest: Effort normal and breath sounds normal. No respiratory distress. She has no wheezes. She exhibits no tenderness.  Abdominal: Soft. Normal appearance. There is tenderness in the suprapubic area and left lower quadrant. There is no CVA tenderness.    Genitourinary: Pelvic exam was performed with patient supine. There is no tenderness or lesion on the right labia. There is no tenderness or lesion on the left labia. Uterus is tender. Cervix  exhibits motion tenderness, discharge and friability. Right adnexum displays no tenderness. Left adnexum displays no tenderness. There is erythema in the vagina. No bleeding in the vagina. Vaginal discharge found.    Musculoskeletal: She exhibits no edema or tenderness.  Neurological: She is alert and oriented to person, place, and time. She has normal reflexes. She displays normal reflexes. No cranial nerve deficit. She exhibits normal muscle tone. Coordination normal.  Skin: Skin is warm and dry. Capillary refill takes less than 2 seconds. No rash noted.  Psychiatric: She has a normal mood and affect.  Nursing note and vitals reviewed.    ED Treatments / Results  Labs (all labs ordered are listed, but only abnormal results are displayed) Labs Reviewed  WET PREP, GENITAL - Abnormal; Notable for the following:       Result Value   Trich, Wet Prep PRESENT (*)    Clue Cells Wet Prep HPF POC PRESENT (*)    WBC, Wet Prep HPF POC MODERATE (*)    All other components within normal limits  URINALYSIS, ROUTINE W REFLEX MICROSCOPIC (NOT AT [facility]) - Abnormal; Notable for the following:    APPearance CLOUDY (*)    Leukocytes, UA MODERATE (*)    All other components within normal limits  URINE MICROSCOPIC-ADD ON - Abnormal; Notable for the following:    Squamous Epithelial / LPF 6-30 (*)    Bacteria, UA MANY (*)    All other components within normal limits  CBC WITH DIFFERENTIAL/PLATELET - Abnormal; Notable for the following:    Neutro Abs 1.3 (*)    All other components within normal limits  PREGNANCY, URINE  COMPREHENSIVE METABOLIC PANEL  LIPASE, BLOOD  I-STAT CG4 LACTIC ACID, ED  GC/CHLAMYDIA PROBE AMP ([facility]) NOT AT [facility]    EKG  EKG Interpretation None       Radiology US Transvaginal Non-ob  Result Date: 02/25/2016 CLINICAL DATA:  Pelvic pain, STD EXAM: TRANSABDOMINAL AND TRANSVAGINAL ULTRASOUND OF PELVIS TECHNIQUE: Both transabdominal and transvaginal ultrasound  examinations of the pelvis were performed. Transabdominal technique was performed for global imaging of the pelvis including uterus, ovaries, adnexal regions, and pelvic cul-de-sac. It was necessary to proceed with endovaginal exam following the transabdominal exam to visualize the bilateral ovaries. COMPARISON:  09/04/2015 FINDINGS: Uterus Measurements: 8.1 x 5.2 x 6.0 cm. Retroverted. No fibroids or other mass visualized. Endometrium Thickness: 9 mm.  No focal abnormality visualized. Right ovary Measurements: 3.3 x 2.9 x 1.9 cm. Normal appearance/no adnexal mass. Left ovary Measurements: 2.3 x 3.2 x 2.0 cm. Normal appearance/no adnexal mass. Other findings Small volume pelvic ascites. IMPRESSION: Negative pelvic ultrasound. Electronically Signed   By: Julian Hy M.D.   On: 02/25/2016 13:52   US Pelvis Complete  Result Date: 02/25/2016 CLINICAL DATA:  Pelvic pain, STD EXAM: TRANSABDOMINAL AND TRANSVAGINAL ULTRASOUND OF PELVIS TECHNIQUE: Both transabdominal and transvaginal ultrasound examinations of the pelvis were performed. Transabdominal technique was performed for global imaging of the pelvis including uterus, ovaries, adnexal regions, and pelvic cul-de-sac. It was necessary to proceed with endovaginal exam following the transabdominal exam to visualize the bilateral ovaries. COMPARISON:  09/04/2015 FINDINGS: Uterus Measurements: 8.1 x 5.2 x 6.0 cm. Retroverted. No fibroids or other mass visualized. Endometrium Thickness: 9 mm.  No focal abnormality visualized. Right ovary Measurements: 3.3 x 2.9 x 1.9 cm. Normal appearance/no adnexal mass. Left ovary Measurements: 2.3 x 3.2 x 2.0 cm. Normal appearance/no adnexal mass. Other findings Small volume pelvic ascites. IMPRESSION: Negative pelvic ultrasound. Electronically Signed   By: Julian Hy M.D.   On: 02/25/2016 13:52    Procedures Procedures (including critical care time)  Medications Ordered in ED Medications  sodium chloride 0.9 %  bolus 1,000 mL (0 mLs Intravenous Stopped 02/25/16 1120)  ondansetron (ZOFRAN) injection 4 mg (4 mg Intravenous Given 02/25/16 1013)  morphine 4 MG/ML injection 4 mg (4 mg Intravenous Given 02/25/16 1013)  morphine 4 MG/ML injection 4 mg (4 mg Intravenous Given 02/25/16 1234)  cefTRIAXone (ROCEPHIN) injection 250 mg (250 mg Intramuscular Given 02/25/16 1514)  doxycycline (VIBRA-TABS) tablet 100 mg (100 mg Oral Given 02/25/16 1514)  metroNIDAZOLE (FLAGYL) tablet 500 mg (500 mg Oral Given 02/25/16 1514)  lidocaine (PF) (XYLOCAINE) 1 % injection (5 mLs  Given 02/25/16 1515)  ondansetron (ZOFRAN) 4 MG/2ML injection (4 mg  Given 02/25/16 1503)     Initial Impression / Assessment and Plan / ED Course  I have reviewed the triage vital signs and the nursing notes.  Pertinent labs & imaging results that were available during my care of the patient were reviewed by me and considered in my medical decision making (see chart for details).  Clinical Course    Rhonda Mcgee is a 30 y.o. female with a past medical history significant for STI who presents  with abdominal/pelvic pain, nausea, vomiting, vaginal discharge, dysuria, and diarrhea.  History and exam are seen above.  Pelvic exam performed by me and revealed an erythematous cervix with discharge. No bleeding was present. Patient did have cervical motion tenderness but had very severe left adnexal tenderness. Patient also had left lower quadrant tenderness on abdominal exam. Patient had no back tenderness, CVA tenderness, and lungs were clear. No rashes were seen on patient.  Due to findings on physical exam concerning for PID versus tubo-ovarian abscess, patient will have transvaginal ultrasound to look for pathology. Patient given pain medicine, nausea medicine and fluids with some improvement in discomfort. Patient will have urinalysis and labs.   Patient will be treated for PID however, will wait for ultrasound results.  Ultrasound was  obtained showing no evidence of TOA.   Patient treated with one shot of IM Rocephin, And oral doses of doxycycline and Flagyl. This will treat for PID as well as treat her BV and trichomoniasis. Patient felt better after pain medication and nausea medication. Patient felt better after fluids.  Patient given prescriptions for pain medicine, nausea medicine, doxycycline, and Flagyl. Patient instructed to follow up and establish care with a PCP for further management. Patient counseled on avoiding further sexual transmitted infections. Patient understood return precautions for any new or worsening symptoms. Patient had no other questions or concerns and patient discharged in good condition.   Final Clinical Impressions(s) / ED Diagnoses   Final diagnoses:  Pelvic pain  PID (acute pelvic inflammatory disease)  Lower abdominal pain  Non-intractable vomiting with nausea, unspecified vomiting type    New Prescriptions Discharge Medication List as of 02/25/2016  3:32 PM    START taking these medications   Details  doxycycline (VIBRAMYCIN) 100 MG capsule Take 1 capsule (100 mg total) by mouth 2 (two) times daily., Starting Sun 02/25/2016, Until Sun 03/10/2016, Print    HYDROcodone-acetaminophen (NORCO/VICODIN) 5-325 MG tablet Take 1 tablet by mouth every 4 (four) hours as needed., Starting Sun 02/25/2016, Print    metroNIDAZOLE (FLAGYL) 500 MG tablet Take 1 tablet (500 mg total) by mouth 2 (two) times daily., Starting Sun 02/25/2016, Until Sun 03/10/2016, Print    ondansetron (ZOFRAN) 4 MG tablet Take 1 tablet (4 mg total) by mouth every 8 (eight) hours as needed for nausea or vomiting., Starting Sun 02/25/2016, Print        Clinical Impression: 1. PID (acute pelvic inflammatory disease)   2. Pelvic pain   3. Lower abdominal pain   4. Non-intractable vomiting with nausea, unspecified vomiting type     Disposition: Discharge  Condition: Good  I have discussed the results, Dx and Tx  plan with the pt(& family if present). He/she/they expressed understanding and agree(s) with the plan. Discharge instructions discussed at great length. Strict return precautions discussed and pt &/or family have verbalized understanding of the instructions. No further questions at time of discharge.    Discharge Medication List as of 02/25/2016  3:32 PM    START taking these medications   Details  doxycycline (VIBRAMYCIN) 100 MG capsule Take 1 capsule (100 mg total) by mouth 2 (two) times daily., Starting Sun 02/25/2016, Until Sun 03/10/2016, Print    HYDROcodone-acetaminophen (NORCO/VICODIN) 5-325 MG tablet Take 1 tablet by mouth every 4 (four) hours as needed., Starting Sun 02/25/2016, Print    metroNIDAZOLE (FLAGYL) 500 MG tablet Take 1 tablet (500 mg total) by mouth 2 (two) times daily., Starting Sun 02/25/2016, Until Sun 03/10/2016, Print    ondansetron (  ZOFRAN) 4 MG tablet Take 1 tablet (4 mg total) by mouth every 8 (eight) hours as needed for nausea or vomiting., Starting Sun 02/25/2016, Print        Follow Up: [place] [phone] [place], MD 02/26/16 1057

## 2016-02-26 LAB — GC/CHLAMYDIA PROBE AMP (~~LOC~~) NOT AT ARMC
CHLAMYDIA, DNA PROBE: POSITIVE — AB
NEISSERIA GONORRHEA: POSITIVE — AB

## 2016-02-27 ENCOUNTER — Telehealth (HOSPITAL_BASED_OUTPATIENT_CLINIC_OR_DEPARTMENT_OTHER): Payer: Self-pay | Admitting: Emergency Medicine

## 2016-04-10 ENCOUNTER — Encounter (HOSPITAL_BASED_OUTPATIENT_CLINIC_OR_DEPARTMENT_OTHER): Payer: Self-pay | Admitting: Emergency Medicine

## 2016-04-10 ENCOUNTER — Emergency Department (HOSPITAL_BASED_OUTPATIENT_CLINIC_OR_DEPARTMENT_OTHER)
Admission: EM | Admit: 2016-04-10 | Discharge: 2016-04-10 | Disposition: A | Discharge status: Home / Self Care | Payer: Medicaid Other | Attending: Emergency Medicine | Admitting: Emergency Medicine

## 2016-04-10 DIAGNOSIS — N760 Acute vaginitis: Secondary | ICD-10-CM | POA: Insufficient documentation

## 2016-04-10 DIAGNOSIS — F17210 Nicotine dependence, cigarettes, uncomplicated: Secondary | ICD-10-CM | POA: Insufficient documentation

## 2016-04-10 DIAGNOSIS — N730 Acute parametritis and pelvic cellulitis: Secondary | ICD-10-CM

## 2016-04-10 DIAGNOSIS — B9689 Other specified bacterial agents as the cause of diseases classified elsewhere: Secondary | ICD-10-CM

## 2016-04-10 DIAGNOSIS — J029 Acute pharyngitis, unspecified: Secondary | ICD-10-CM | POA: Insufficient documentation

## 2016-04-10 LAB — URINALYSIS, ROUTINE W REFLEX MICROSCOPIC
Bilirubin Urine: NEGATIVE
Glucose, UA: NEGATIVE mg/dL
Hgb urine dipstick: NEGATIVE
Ketones, ur: NEGATIVE mg/dL
LEUKOCYTES UA: NEGATIVE
NITRITE: NEGATIVE
PH: 6 (ref 5.0–8.0)
Protein, ur: NEGATIVE mg/dL
SPECIFIC GRAVITY, URINE: 1.018 (ref 1.005–1.030)

## 2016-04-10 LAB — WET PREP, GENITAL
Sperm: NONE SEEN
TRICH WET PREP: NONE SEEN
YEAST WET PREP: NONE SEEN

## 2016-04-10 LAB — PREGNANCY, URINE: PREG TEST UR: NEGATIVE

## 2016-04-10 MED ORDER — METRONIDAZOLE 500 MG PO TABS: 500.0000 mg | ORAL_TABLET | Freq: Two times a day (BID) | ORAL | 0 refills | Status: DC

## 2016-04-10 MED ORDER — LIDOCAINE HCL (PF) 1 % IJ SOLN
INTRAMUSCULAR | Status: AC
  Administered 2016-04-10: 5 mL
  Filled 2016-04-10: qty 5

## 2016-04-10 MED ORDER — DOXYCYCLINE HYCLATE 100 MG PO CAPS: 100.0000 mg | ORAL_CAPSULE | Freq: Two times a day (BID) | ORAL | 0 refills | Status: AC

## 2016-04-10 MED ORDER — CEFTRIAXONE SODIUM 250 MG IJ SOLR
250.0000 mg | Freq: Once | INTRAMUSCULAR | Status: AC
  Administered 2016-04-10: 250 mg via INTRAMUSCULAR
  Filled 2016-04-10: qty 250

## 2016-04-10 MED ORDER — AZITHROMYCIN 250 MG PO TABS
1000.0000 mg | ORAL_TABLET | Freq: Once | ORAL | Status: AC
  Administered 2016-04-10: 1000 mg via ORAL
  Filled 2016-04-10: qty 4

## 2016-04-10 NOTE — ED Triage Notes (Signed)
Patient has ongoing PID and sore throat. Patient was seen in October for the same. Patient was given scripts that she did not fill. Patient states she still is unable to afford prescriptions, states she has insurance now but isn't sure what is covered. Patient wants to "just be treated here".

## 2016-04-10 NOTE — ED Provider Notes (Signed)
[facility] Provider Note   CSN: [id number] Arrival date & time: 04/10/16  [id number] By signing my name below, I, Dyke Brackett, attest that this documentation has been prepared under the direction and in the presence of Deno Etienne, DO . Electronically Signed: Dyke Brackett, Scribe. 04/10/2016. 8:31 PM.   History   Chief Complaint Chief Complaint  Patient presents with  . Pelvic Inflammatory Disease  . Sore Throat   HPI Rhonda Mcgee is a 30 y.o. female with a PMHx of herpes genitalia who presents to the Emergency Department complaining of ongoing, moderate pelvic pain onset three months ago. Pt describes her pain as aching, cramping and sharp. She also complains of ongoing sore throat with associated postnasal drainage. No treatments tried PTA. No alleviating or modifying factors noted.  Pt was seen in October 2017 for the same and dx with PID. Per pt, she given prescriptions that she could not fill because she could not afford it. Pt denies fever, upper abdominal pain, rashes or any other associated symptoms.   The history is provided by the patient. No language interpreter was used.   Past Medical History:  Diagnosis Date  . Herpes genitalia   . Lupus   . Rheumatoid arthritis(714.0)     There are no active problems to display for this patient.   Past Surgical History:  Procedure Laterality Date  . ADENOIDECTOMY    . CHOLECYSTECTOMY    . TONSILLECTOMY    . TUBAL LIGATION      OB History    No data available      Home Medications    Prior to Admission medications   Medication Sig Start Date End Date Taking? Authorizing Provider  doxycycline (VIBRAMYCIN) 100 MG capsule Take 1 capsule (100 mg total) by mouth 2 (two) times daily. One po bid x 7 days 04/10/16 04/24/16  Deno Etienne, DO  fluconazole (DIFLUCAN) 150 MG tablet Take 1 tablet (150 mg total) by mouth once. Take 5/18 days if symptoms are still persisting. 09/07/15   Charlesetta Shanks, MD    HYDROcodone-acetaminophen (NORCO/VICODIN) 5-325 MG tablet Take 1 tablet by mouth every 4 (four) hours as needed. 02/25/16   Gwenyth Allegra Tegeler, MD  metroNIDAZOLE (FLAGYL) 500 MG tablet Take 1 tablet (500 mg total) by mouth 2 (two) times daily. One po bid x 7 days 04/10/16   Deno Etienne, DO  ondansetron (ZOFRAN) 4 MG tablet Take 1 tablet (4 mg total) by mouth every 8 (eight) hours as needed for nausea or vomiting. 02/25/16   Courtney Paris, MD    Family History History reviewed. No pertinent family history.  Social History Social History  Substance Use Topics  . Smoking status: Current Some Day Smoker    Types: Cigarettes  . Smokeless tobacco: Never Used  . Alcohol use Yes     Comment: occasional    Allergies   Cephalexin  Review of Systems Review of Systems  Constitutional: Negative for chills and fever.  HENT: Positive for postnasal drip and sore throat. Negative for congestion and rhinorrhea.   Eyes: Negative for redness and visual disturbance.  Respiratory: Negative for shortness of breath and wheezing.   Cardiovascular: Negative for chest pain and palpitations.  Gastrointestinal: Negative for nausea and vomiting.  Genitourinary: Positive for pelvic pain. Negative for dysuria and urgency.  Musculoskeletal: Negative for arthralgias and myalgias.  Skin: Negative for pallor and wound.  Neurological: Negative for dizziness and headaches.   Physical Exam Updated Vital Signs BP 159/97 (BP Location:  Left Arm)   Pulse 95   Temp 98.7 F (37.1 C) (Oral)   Resp 18   Ht 5\' 5"  (1.651 m)   Wt 193 lb (87.5 kg)   SpO2 100%   BMI 32.12 kg/m   Physical Exam  Constitutional: She is oriented to person, place, and time. She appears well-developed and well-nourished. No distress.  HENT:  Head: Normocephalic and atraumatic.  Mouth/Throat: No oropharyngeal exudate, posterior oropharyngeal edema or posterior oropharyngeal erythema.  Small pustule to the posterior oropjarnyx    Eyes: EOM are normal. Pupils are equal, round, and reactive to light.  Neck: Normal range of motion. Neck supple.  Cardiovascular: Normal rate and regular rhythm.  Exam reveals no gallop and no friction rub.   No murmur heard. Pulmonary/Chest: Effort normal. She has no wheezes. She has no rales.  Abdominal: Soft. She exhibits no distension. There is no tenderness.  No abdominal pain.   Genitourinary: Cervix exhibits motion tenderness and discharge.  Musculoskeletal: She exhibits no edema or tenderness.  Neurological: She is alert and oriented to person, place, and time.  Skin: Skin is warm and dry. She is not diaphoretic.  Psychiatric: She has a normal mood and affect. Her behavior is normal.  Nursing note and vitals reviewed.  ED Treatments / Results  DIAGNOSTIC STUDIES:  Oxygen Saturation is 100% on RA, normal by my interpretation.    COORDINATION OF CARE:  8:00 PM Discussed treatment plan with pt at bedside and pt agreed to plan.   Labs (all labs ordered are listed, but only abnormal results are displayed) Labs Reviewed  WET PREP, GENITAL - Abnormal; Notable for the following:       Result Value   Clue Cells Wet Prep HPF POC PRESENT (*)    WBC, Wet Prep HPF POC FEW (*)    All other components within normal limits  URINALYSIS, ROUTINE W REFLEX MICROSCOPIC  PREGNANCY, URINE  GC/CHLAMYDIA PROBE AMP () NOT AT [facility]    EKG  EKG Interpretation None       Radiology No results found.  Procedures Procedures (including critical care time)  Medications Ordered in ED Medications  cefTRIAXone (ROCEPHIN) injection 250 mg (250 mg Intramuscular Given 04/10/16 2048)  azithromycin (ZITHROMAX) tablet 1,000 mg (1,000 mg Oral Given 04/10/16 2048)  lidocaine (PF) (XYLOCAINE) 1 % injection (5 mLs  Given 04/10/16 2048)     Initial Impression / Assessment and Plan / ED Course  I have reviewed the triage vital signs and the nursing notes.  Pertinent labs & imaging  results that were available during my care of the patient were reviewed by me and considered in my medical decision making (see chart for details).  Clinical Course     30 yo F With recurrent PID. Patient states that her symptoms have persisted. She was unable to fill her doxycycline. On my exam patient with cervical motion tenderness and vaginal discharge. No adnexal tenderness no fevers. Do not feel ultrasound is warranted at this time. Given Rocephin and azithromycin here. Given a prescription for Flagyl as well as doxycycline. Patient states she is now insured and should be able to get it filled.   9:00 PM:  I have discussed the diagnosis/risks/treatment options with the patient and believe the pt to be eligible for discharge home to follow-up with PCP/OB/health department. We also discussed returning to the ED immediately if new or worsening sx occur. We discussed the sx which are most concerning (e.g., sudden worsening pain, fever, inability to  tolerate by mouth ) that necessitate immediate return. Medications administered to the patient during their visit and any new prescriptions provided to the patient are listed below.  Medications given during this visit Medications  cefTRIAXone (ROCEPHIN) injection 250 mg (250 mg Intramuscular Given 04/10/16 2048)  azithromycin (ZITHROMAX) tablet 1,000 mg (1,000 mg Oral Given 04/10/16 2048)  lidocaine (PF) (XYLOCAINE) 1 % injection (5 mLs  Given 04/10/16 2048)     The patient appears reasonably screen and/or stabilized for discharge and I doubt any other medical condition or other [facility] requiring further screening, evaluation, or treatment in the ED at this time prior to discharge.    Final Clinical Impressions(s) / ED Diagnoses   Final diagnoses:  PID (acute pelvic inflammatory disease)  BV (bacterial vaginosis)    New Prescriptions New Prescriptions   DOXYCYCLINE (VIBRAMYCIN) 100 MG CAPSULE    Take 1 capsule (100 mg total) by mouth 2 (two)  times daily. One po bid x 7 days   METRONIDAZOLE (FLAGYL) 500 MG TABLET    Take 1 tablet (500 mg total) by mouth 2 (two) times daily. One po bid x 7 days   I personally performed the services described in this documentation, which was scribed in my presence. The recorded information has been reviewed and is accurate.     Deno Etienne, DO 04/10/16 2100

## 2016-04-10 NOTE — ED Notes (Signed)
Pt c/o sore throat and vaginal pain x 3 months.  Having burning w urination, vag dc, lower abd pain  Has been seen for same but didn't get prescription filled

## 2016-04-11 LAB — GC/CHLAMYDIA PROBE AMP (~~LOC~~) NOT AT ARMC
CHLAMYDIA, DNA PROBE: NEGATIVE
NEISSERIA GONORRHEA: NEGATIVE

## 2016-06-14 ENCOUNTER — Emergency Department (HOSPITAL_BASED_OUTPATIENT_CLINIC_OR_DEPARTMENT_OTHER)
Admission: EM | Admit: 2016-06-14 | Discharge: 2016-06-14 | Disposition: A | Discharge status: Home / Self Care | Payer: Medicaid Other | Attending: Emergency Medicine | Admitting: Emergency Medicine

## 2016-06-14 ENCOUNTER — Encounter (HOSPITAL_BASED_OUTPATIENT_CLINIC_OR_DEPARTMENT_OTHER): Payer: Self-pay | Admitting: Emergency Medicine

## 2016-06-14 DIAGNOSIS — G8929 Other chronic pain: Secondary | ICD-10-CM

## 2016-06-14 DIAGNOSIS — R102 Pelvic and perineal pain unspecified side: Secondary | ICD-10-CM

## 2016-06-14 DIAGNOSIS — N760 Acute vaginitis: Secondary | ICD-10-CM | POA: Insufficient documentation

## 2016-06-14 DIAGNOSIS — R197 Diarrhea, unspecified: Secondary | ICD-10-CM

## 2016-06-14 DIAGNOSIS — R112 Nausea with vomiting, unspecified: Secondary | ICD-10-CM

## 2016-06-14 DIAGNOSIS — B9689 Other specified bacterial agents as the cause of diseases classified elsewhere: Secondary | ICD-10-CM

## 2016-06-14 DIAGNOSIS — F17210 Nicotine dependence, cigarettes, uncomplicated: Secondary | ICD-10-CM | POA: Insufficient documentation

## 2016-06-14 DIAGNOSIS — K529 Noninfective gastroenteritis and colitis, unspecified: Secondary | ICD-10-CM

## 2016-06-14 HISTORY — DX: Chlamydial infection, unspecified: A74.9

## 2016-06-14 HISTORY — DX: Gonococcal infection, unspecified: A54.9

## 2016-06-14 LAB — CBC WITH DIFFERENTIAL/PLATELET
BASOS ABS: 0 10*3/uL (ref 0.0–0.1)
BASOS PCT: 0 %
EOS ABS: 0.2 10*3/uL (ref 0.0–0.7)
EOS PCT: 4 %
HCT: 36 % (ref 36.0–46.0)
Hemoglobin: 11.9 g/dL — ABNORMAL LOW (ref 12.0–15.0)
Lymphocytes Relative: 56 %
Lymphs Abs: 3.1 10*3/uL (ref 0.7–4.0)
MCH: 27.4 pg (ref 26.0–34.0)
MCHC: 33.1 g/dL (ref 30.0–36.0)
MCV: 82.8 fL (ref 78.0–100.0)
Monocytes Absolute: 0.5 10*3/uL (ref 0.1–1.0)
Monocytes Relative: 9 %
Neutro Abs: 1.7 10*3/uL (ref 1.7–7.7)
Neutrophils Relative %: 31 %
PLATELETS: 239 10*3/uL (ref 150–400)
RBC: 4.35 MIL/uL (ref 3.87–5.11)
RDW: 15.3 % (ref 11.5–15.5)
WBC: 5.5 10*3/uL (ref 4.0–10.5)

## 2016-06-14 LAB — WET PREP, GENITAL
SPERM: NONE SEEN
TRICH WET PREP: NONE SEEN
Yeast Wet Prep HPF POC: NONE SEEN

## 2016-06-14 LAB — COMPREHENSIVE METABOLIC PANEL
ALBUMIN: 3.6 g/dL (ref 3.5–5.0)
ALT: 15 U/L (ref 14–54)
AST: 18 U/L (ref 15–41)
Alkaline Phosphatase: 75 U/L (ref 38–126)
Anion gap: 7 (ref 5–15)
BUN: 10 mg/dL (ref 6–20)
CHLORIDE: 109 mmol/L (ref 101–111)
CO2: 23 mmol/L (ref 22–32)
CREATININE: 0.83 mg/dL (ref 0.44–1.00)
Calcium: 8.7 mg/dL — ABNORMAL LOW (ref 8.9–10.3)
GFR calc non Af Amer: >60 mL/min (ref >60)
GLUCOSE: 76 mg/dL (ref 65–99)
Potassium: 3.9 mmol/L (ref 3.5–5.1)
SODIUM: 139 mmol/L (ref 135–145)
Total Bilirubin: 0.6 mg/dL (ref 0.3–1.2)
Total Protein: 7.4 g/dL (ref 6.5–8.1)

## 2016-06-14 LAB — URINALYSIS, ROUTINE W REFLEX MICROSCOPIC
Bilirubin Urine: NEGATIVE
GLUCOSE, UA: NEGATIVE mg/dL
HGB URINE DIPSTICK: NEGATIVE
KETONES UR: NEGATIVE mg/dL
LEUKOCYTES UA: NEGATIVE
Nitrite: NEGATIVE
PH: 6.5 (ref 5.0–8.0)
Protein, ur: NEGATIVE mg/dL
Specific Gravity, Urine: 1.026 (ref 1.005–1.030)

## 2016-06-14 LAB — PREGNANCY, URINE: Preg Test, Ur: NEGATIVE

## 2016-06-14 MED ORDER — CEFTRIAXONE SODIUM 250 MG IJ SOLR
250.0000 mg | Freq: Once | INTRAMUSCULAR | Status: AC
  Administered 2016-06-14: 250 mg via INTRAMUSCULAR
  Filled 2016-06-14: qty 250

## 2016-06-14 MED ORDER — METRONIDAZOLE 500 MG PO TABS: 500.0000 mg | ORAL_TABLET | Freq: Two times a day (BID) | ORAL | 0 refills | Status: DC

## 2016-06-14 MED ORDER — SODIUM CHLORIDE 0.9 % IV BOLUS (SEPSIS)
1000.0000 mL | Freq: Once | INTRAVENOUS | Status: AC
  Administered 2016-06-14: 1000 mL via INTRAVENOUS

## 2016-06-14 MED ORDER — ONDANSETRON HCL 4 MG/2ML IJ SOLN
4.0000 mg | Freq: Once | INTRAMUSCULAR | Status: AC
  Administered 2016-06-14: 4 mg via INTRAVENOUS
  Filled 2016-06-14: qty 2

## 2016-06-14 MED ORDER — AZITHROMYCIN 250 MG PO TABS
1000.0000 mg | ORAL_TABLET | Freq: Once | ORAL | Status: AC
  Administered 2016-06-14: 1000 mg via ORAL
  Filled 2016-06-14: qty 4

## 2016-06-14 MED ORDER — ONDANSETRON 8 MG PO TBDP: 8.0000 mg | ORAL_TABLET | Freq: Three times a day (TID) | ORAL | 0 refills | Status: DC | PRN

## 2016-06-14 MED ORDER — METRONIDAZOLE 500 MG PO TABS
2000.0000 mg | ORAL_TABLET | Freq: Once | ORAL | Status: AC
  Administered 2016-06-14: 2000 mg via ORAL
  Filled 2016-06-14: qty 4

## 2016-06-14 NOTE — ED Provider Notes (Signed)
[facility] Provider Note   CSN: [id number] Arrival date & time: 06/14/16  1639  By signing my name below, I, Rhonda Mcgee, attest that this documentation has been prepared under the direction and in the presence of [address], [place]. Electronically Signed: Gwenlyn Mcgee, ED Scribe. 06/14/16. 5:20 PM.  History   Chief Complaint Chief Complaint  Patient presents with  . Emesis   The history is provided by the patient and medical records. No language interpreter was used.  Abdominal Pain   This is a chronic problem. The current episode started more than 1 week ago. The problem occurs daily. The problem has been gradually worsening. The pain is associated with an unknown factor. The pain is located in the LLQ, suprapubic region and RLQ. The pain is at a severity of 7/10. The pain is moderate. Associated symptoms include diarrhea, nausea and vomiting. Pertinent negatives include fever, flatus, hematochezia, melena, constipation, dysuria, hematuria, arthralgias and myalgias. Exacerbated by: intercourse. Nothing relieves the symptoms. Past workup includes CT scan and ultrasound.   HPI Comments: Rhonda Mcgee is a 31 y.o. female with a PMHx of chronic abd pain, who presents to the Emergency Department complaining of ongoing chronic abdominal pain "for a while", she thinks it's been for the past ~4 months (although chart review reveals these same complaints dating all the way back to 2015, with ~8 ED visits since then). Pt describes pain as gradual onset, progressively worsening, 7.5/10, intermittent, sharp cramping, lower abdominal pain that radiates up her back that is exacerbated with intercourse. No treatments tried and no known alleviating factors. She reports associated 3 epidsodes of NBNB vomiting in the last 24hrs which she states is a chronic intermittent issue as well, in addition to nausea, and 4 episodes of loose nonbloody diarrhea today which has also been ongoing for a  while. She has tried Zofran with moderate relief to symptoms, but she has run out of Zofran at home. She reports 1 recent partner and tested positive for Gonorrhea/Chlamydia in 01/2016, and she had an ED visit 02/25/16 where she was also tested +GC/CT/Trich and was treated for those, in addition to being treated for PID. She had a negative TVUS that day, in addition to multiple negative past work ups over the years. She and her partner were treated, but she has continues to have these. Pt denies recent travel or suspicious food intake. She has not taken any antibiotics in the last 4wks, and denies recent alcohol use. No regular NSAID use. LMP on 05/19/2016. She has been [facility] at [facility] and was prescribed Flagyl but states that didn't help. She had an ED visit 03/2016 at which time her testing was negative but she was still treated for PID and BV. She denies fevers, chills, CP, SOB, constipation, melena, hematochezia, hematemesis, obstipation, hematuria, dysuria, vaginal bleeding/discharge, myalgias, arthralgias, numbness, tingling, focal weakness, rashes, or any other complaints at this time.    Past Medical History:  Diagnosis Date  . Chlamydia   . Gonorrhea   . Herpes genitalia   . Lupus   . Rheumatoid arthritis(714.0)     There are no active problems to display for this patient.   Past Surgical History:  Procedure Laterality Date  . ADENOIDECTOMY    . CHOLECYSTECTOMY    . TONSILLECTOMY    . TUBAL LIGATION      OB History    No data available       Home Medications    Prior to Admission medications  Medication Sig Start Date End Date Taking? Authorizing Provider  fluconazole (DIFLUCAN) 150 MG tablet Take 1 tablet (150 mg total) by mouth once. Take 5/18 days if symptoms are still persisting. 09/07/15   Charlesetta Shanks, MD  HYDROcodone-acetaminophen (NORCO/VICODIN) 5-325 MG tablet Take 1 tablet by mouth every 4 (four) hours as needed. 02/25/16   Gwenyth Allegra Tegeler, MD    metroNIDAZOLE (FLAGYL) 500 MG tablet Take 1 tablet (500 mg total) by mouth 2 (two) times daily. One po bid x 7 days 04/10/16   Deno Etienne, DO  ondansetron (ZOFRAN) 4 MG tablet Take 1 tablet (4 mg total) by mouth every 8 (eight) hours as needed for nausea or vomiting. 02/25/16   Courtney Paris, MD    Family History No family history on file.  Social History Social History  Substance Use Topics  . Smoking status: Current Some Day Smoker    Types: Cigarettes  . Smokeless tobacco: Never Used  . Alcohol use Yes     Comment: occasional     Allergies   Cephalexin   Review of Systems Review of Systems  Constitutional: Negative for chills and fever.  Respiratory: Negative for shortness of breath.   Cardiovascular: Negative for chest pain.  Gastrointestinal: Positive for abdominal pain, diarrhea, nausea and vomiting. Negative for blood in stool, constipation, flatus, hematochezia and melena.  Genitourinary: Negative for dysuria, hematuria, vaginal bleeding and vaginal discharge.  Musculoskeletal: Negative for arthralgias and myalgias.  Skin: Negative for color change.  Allergic/Immunologic: Negative for immunocompromised state.  Neurological: Negative for weakness and numbness.  Psychiatric/Behavioral: Negative for confusion.   10 Systems reviewed and are negative for acute change except as noted in the HPI.   Physical Exam Updated Vital Signs BP 120/80   Pulse 84   Temp 98.6 F (37 C) (Oral)   Resp 16   Ht 5\' 5"  (1.651 m)   Wt 190 lb (86.2 kg)   LMP 05/19/2016 (Approximate)   SpO2 100%   BMI 31.62 kg/m   Physical Exam  Constitutional: She is oriented to person, place, and time. Vital signs are normal. She appears well-developed and well-nourished.  Non-toxic appearance. No distress.  Afebrile, nontoxic, NAD  HENT:  Head: Normocephalic and atraumatic.  Mouth/Throat: Oropharynx is clear and moist and mucous membranes are normal.  Eyes: Conjunctivae and EOM are  normal. Right eye exhibits no discharge. Left eye exhibits no discharge.  Neck: Normal range of motion. Neck supple.  Cardiovascular: Normal rate, regular rhythm, normal heart sounds and intact distal pulses.  Exam reveals no gallop and no friction rub.   No murmur heard. Pulmonary/Chest: Effort normal and breath sounds normal. No respiratory distress. She has no decreased breath sounds. She has no wheezes. She has no rhonchi. She has no rales.  Abdominal: Soft. Normal appearance and bowel sounds are normal. She exhibits no distension. There is tenderness in the right lower quadrant, suprapubic area and left lower quadrant. There is no rigidity, no rebound, no guarding, no CVA tenderness, no tenderness at McBurney's point and negative Murphy's sign.  Soft, Non distended, +BS throughout, with mild lower abdominal TTP, no r/g/r, neg murphy's, neg mcburney's, no CVA TTP   Genitourinary: Vagina normal and uterus normal. Pelvic exam was performed with patient supine. There is no rash, tenderness or lesion on the right labia. There is no rash, tenderness or lesion on the left labia. Cervix exhibits motion tenderness (very mild) and friability. Cervix exhibits no discharge. Right adnexum displays no mass, no tenderness and  no fullness. Left adnexum displays no mass, no tenderness and no fullness. No erythema, tenderness or bleeding in the vagina. No vaginal discharge found.  Genitourinary Comments: Chaperone present for exam. No rashes, lesions, or tenderness to external genitalia. No erythema, injury, or tenderness to vaginal mucosa. No vaginal discharge or bleeding within vaginal vault. No adnexal masses, tenderness, or fullness. Very mild diffuse tenderness during pelvic exam, somewhat mild +CMT but doesn't seem very remarkable, with mild cervical friability, and no discharge from cervical os. Cervical os is closed. Uterus non-deviated, mobile, and without enlargement.    Musculoskeletal: Normal range of  motion.  Neurological: She is alert and oriented to person, place, and time. She has normal strength. No sensory deficit.  Skin: Skin is warm, dry and intact. No rash noted.  Psychiatric: She has a normal mood and affect.  Nursing note and vitals reviewed.   ED Treatments / Results  DIAGNOSTIC STUDIES: Oxygen Saturation is 100% on RA, normal by my interpretation.    COORDINATION OF CARE: 5:20 PM Discussed treatment plan with pt at bedside which includes Zofran, Pelvic Exam and follow up with OB/GYN and pt agreed to plan.  Labs (all labs ordered are listed, but only abnormal results are displayed) Labs Reviewed  WET PREP, GENITAL - Abnormal; Notable for the following:       Result Value   Clue Cells Wet Prep HPF POC PRESENT (*)    WBC, Wet Prep HPF POC FEW (*)    All other components within normal limits  CBC WITH DIFFERENTIAL/PLATELET - Abnormal; Notable for the following:    Hemoglobin 11.9 (*)    All other components within normal limits  COMPREHENSIVE METABOLIC PANEL - Abnormal; Notable for the following:    Calcium 8.7 (*)    All other components within normal limits  URINALYSIS, ROUTINE W REFLEX MICROSCOPIC  PREGNANCY, URINE  RPR  HIV ANTIBODY (ROUTINE TESTING)  GC/CHLAMYDIA PROBE AMP ([facility]) NOT AT [facility]    EKG  EKG Interpretation None       Radiology No results found.   TVUS 02/25/16 Study Result  CLINICAL DATA:  Pelvic pain, STD  EXAM: TRANSABDOMINAL AND TRANSVAGINAL ULTRASOUND OF PELVIS  TECHNIQUE: Both transabdominal and transvaginal ultrasound examinations of the pelvis were performed. Transabdominal technique was performed for global imaging of the pelvis including uterus, ovaries, adnexal regions, and pelvic cul-de-sac. It was necessary to proceed with endovaginal exam following the transabdominal exam to visualize the bilateral ovaries.  COMPARISON:  09/04/2015  FINDINGS: Uterus  Measurements: 8.1 x 5.2 x 6.0 cm. Retroverted. No  fibroids or other mass visualized.  Endometrium  Thickness: 9 mm.  No focal abnormality visualized.  Right ovary  Measurements: 3.3 x 2.9 x 1.9 cm. Normal appearance/no adnexal mass.  Left ovary  Measurements: 2.3 x 3.2 x 2.0 cm. Normal appearance/no adnexal mass.  Other findings  Small volume pelvic ascites.  IMPRESSION: Negative pelvic ultrasound.   Electronically Signed   By: Julian Hy M.D.   On: 02/25/2016 13:52   CT abd/pelv 02/2014 Study Result  CLINICAL DATA:  Right lower quadrant pain for 1 year, recent worsening  EXAM: CT ABDOMEN AND PELVIS WITH CONTRAST  TECHNIQUE: Multidetector CT imaging of the abdomen and pelvis was performed using the standard protocol following bolus administration of intravenous contrast.  CONTRAST:  76mL OMNIPAQUE IOHEXOL 300 MG/ML SOLN, 146mL OMNIPAQUE IOHEXOL 300 MG/ML SOLN  COMPARISON:  09/30/2012  FINDINGS: The lung bases are free of acute infiltrate or sizable effusion. The liver,  spleen, pancreas, and adrenal glands are within normal limits. The kidneys are well visualized bilaterally and demonstrate a normal enhancement pattern. No renal calculi or obstructive changes are seen.  The appendix is well visualized. The colon is predominantly decompressed. No pericolonic inflammatory changes are seen. The bladder is partially distended. The uterus is somewhat heterogeneous similar to that seen on prior ultrasound examination consistent with uterine fibroid change. No free pelvic fluid or lymphadenopathy is noted.  IMPRESSION: Uterine fibroid disease stable from recent ultrasound examination.  No acute abnormality is noted.   Electronically Signed   By: Inez Catalina M.D.   On: 03/11/2014 12:54     Procedures Procedures (including critical care time)  Medications Ordered in ED Medications  ondansetron (ZOFRAN) injection 4 mg (4 mg Intravenous Given 06/14/16 1801)  sodium chloride  0.9 % bolus 1,000 mL (0 mLs Intravenous Stopped 06/14/16 1901)  azithromycin (ZITHROMAX) tablet 1,000 mg (1,000 mg Oral Given 06/14/16 1902)    And  cefTRIAXone (ROCEPHIN) injection 250 mg (250 mg Intramuscular Given 06/14/16 1903)    And  metroNIDAZOLE (FLAGYL) tablet 2,000 mg (2,000 mg Oral Given 06/14/16 1902)     Initial Impression / Assessment and Plan / ED Course  I have reviewed the triage vital signs and the nursing notes.  Pertinent labs & imaging results that were available during my care of the patient were reviewed by me and considered in my medical decision making (see chart for details).     31 y.o. female here with lower abd pain ongoing for months, gradually worsening, along with n/v/d for several days/weeks. On exam, mild lower abd TTP, nonperitoneal. Appears well. Has had ~8 visits for very similar complaints over the last 65yrs, has had +GC/CT test 02/25/16 however many more negative GC/CT tests. +Trich and BV on 02/25/16, unremarkable pelvic ultrasounds on multiple occasions including on that visit. She's been treated for PID on almost every single occasion she's been here. She's had at CT of her abd in 02/2014 which was unremarkable aside from a small fibroid. Last TVUS in October didn't demonstrate the fibroid any longer. Most recent visit 03/2016 she had a completely unremarkable work up including STD testing that was neg; no imaging on that visit. Long discussion had regarding the fact that performing yet another pelvic exam today is unlikely to discern the etiology of her longstanding pelvic pain; however she wishes to proceed with pelvic exam today. I feel she needs to likely see an OBGYN for evaluation of ?endometriosis, and ultimately that's likely what I'll recommend. U/A and Upreg neg today. Will get labs, pelvic exam, STD panel, and wet prep. Will give zofran and reassess shortly. Doubt need for emergent imaging at this time, but will reassess after pelvic exam.   7:55  PM CBC w/diff unremarkable. CMP WNL. Pelvic revealed mild diffuse pelvic tenderness, some mild CMT but not impressively remarkable, mild cervical friability. Wet prep with +clue cells, so will again treat for BV. I have empirically tx'd for GC/CT/Trich but I will not treat for PID since it doesn't seem that we're really doing anything helpful for this pt by repeatedly treating her for PID and clearly it's not changing her symptoms, and since she's having diarrhea this would just potentially make that worse. I do not feel we should repeat another doxycycline course. However I think she would benefit from seeing an OBGYN for ongoing management of her chronic pelvic pain, which I highly suspect is from endometriosis. Doubt need for emergent imaging at  this time. Will send home with zofran and flagyl. Discussed tylenol/motrin for pain. Discussed abstinence x10 days. F/up with health dept for future STD concerns. Safe sex encouraged, and discussed having partners tested and treated before re-engaging in intercourse after the 10 day abstinence period.  Advised pt f/up with OBGYN in 1wk for ongoing management of her symptoms. Discussed BRAT diet. Pt tolerating PO well here, stable for discharge at this time. I explained the diagnosis and have given explicit precautions to return to the ER including for any other new or worsening symptoms. The patient understands and accepts the medical plan as it's been dictated and I have answered their questions. Discharge instructions concerning home care and prescriptions have been given. The patient is STABLE and is discharged to home in good condition.   I personally performed the services described in this documentation, which was scribed in my presence. The recorded information has been reviewed and is accurate.   Final Clinical Impressions(s) / ED Diagnoses   Final diagnoses:  Chronic pelvic pain in female  Nausea vomiting and diarrhea  BV (bacterial vaginosis)   Gastroenteritis    New Prescriptions New Prescriptions   METRONIDAZOLE (FLAGYL) 500 MG TABLET    Take 1 tablet (500 mg total) by mouth 2 (two) times daily. One po bid x 7 days   ONDANSETRON (ZOFRAN ODT) 8 MG DISINTEGRATING TABLET    Take 1 tablet (8 mg total) by mouth every 8 (eight) hours as needed for nausea or vomiting.     [address], PA-C 06/14/16 [place], MD 06/14/16 2024

## 2016-06-14 NOTE — Discharge Instructions (Signed)
Your pelvic exam revealed bacterial vaginosis on exam, take flagyl as directed, don't drink alcohol while taking that medication. Use zofran as directed as needed for nausea. You have been treated for gonorrhea, chlamydia, and trichomonas in the ER but the hospital will call you if lab is positive. You were tested for HIV and Syphilis, and the hospital will call you if the lab is positive. NO SEXUAL INTERCOURSE FOR AT LEAST 10 DAYS AFTER TODAY'S VISIT, THIS WILL INVALIDATE YOUR TREATMENT HERE. DO NOT ENGAGE IN SEXUAL ACTIVITY UNTIL YOU FIND OUT ABOUT YOUR RESULTS AND HAVE PARTNERS TESTED AND TREATED. ALL PARTNERS MUST BE TESTED AND TREATED FOR STD'S. ALWAYS USE CONDOMS WHEN ENGAGING IN INTERCOURSE. Follow up with [facility] STD clinic for future STD concerns or screenings. This is the recommendation by the CDC for people with multiple sexual partners or history of STDs.  Use tylenol or motrin as needed for pain. Stay well hydrated with small sips of fluids throughout the day. Follow a BRAT (banana-rice-applesauce-toast) diet as described below for the next 24-48 hours. The 'BRAT' diet is suggested, then progress to diet as tolerated as symptoms abate.   Follow up with the women's clinic in 1 week for recheck of symptoms and ongoing management of your chronic pelvic pain. Return to the [facility] hospital emergency department (called the [facility]) for changes or worsening symptoms.

## 2016-06-14 NOTE — ED Triage Notes (Signed)
Vomiting and diarrhea x4 days and Vaginal DC.  Sts she was positive for Gon/Chlam and treated in January. Sts same sx then.

## 2016-06-15 LAB — RPR, QUANT+TP ABS (REFLEX): TREPONEMA PALLIDUM AB: NEGATIVE

## 2016-06-15 LAB — RPR: RPR: REACTIVE — AB

## 2016-06-15 LAB — HIV ANTIBODY (ROUTINE TESTING W REFLEX): HIV SCREEN 4TH GENERATION: NONREACTIVE

## 2016-06-17 LAB — GC/CHLAMYDIA PROBE AMP (~~LOC~~) NOT AT ARMC
CHLAMYDIA, DNA PROBE: NEGATIVE
Neisseria Gonorrhea: NEGATIVE

## 2017-01-13 ENCOUNTER — Emergency Department (HOSPITAL_BASED_OUTPATIENT_CLINIC_OR_DEPARTMENT_OTHER)
Admission: EM | Admit: 2017-01-13 | Discharge: 2017-01-13 | Disposition: A | Discharge status: Home / Self Care | Payer: Self-pay | Attending: Emergency Medicine | Admitting: Emergency Medicine

## 2017-01-13 ENCOUNTER — Encounter (HOSPITAL_BASED_OUTPATIENT_CLINIC_OR_DEPARTMENT_OTHER): Payer: Self-pay | Admitting: *Deleted

## 2017-01-13 ENCOUNTER — Emergency Department (HOSPITAL_BASED_OUTPATIENT_CLINIC_OR_DEPARTMENT_OTHER): Payer: Self-pay

## 2017-01-13 DIAGNOSIS — Z202 Contact with and (suspected) exposure to infections with a predominantly sexual mode of transmission: Secondary | ICD-10-CM | POA: Insufficient documentation

## 2017-01-13 DIAGNOSIS — K219 Gastro-esophageal reflux disease without esophagitis: Secondary | ICD-10-CM | POA: Insufficient documentation

## 2017-01-13 DIAGNOSIS — B9689 Other specified bacterial agents as the cause of diseases classified elsewhere: Secondary | ICD-10-CM

## 2017-01-13 DIAGNOSIS — F17210 Nicotine dependence, cigarettes, uncomplicated: Secondary | ICD-10-CM | POA: Insufficient documentation

## 2017-01-13 DIAGNOSIS — Z79899 Other long term (current) drug therapy: Secondary | ICD-10-CM | POA: Insufficient documentation

## 2017-01-13 DIAGNOSIS — N760 Acute vaginitis: Secondary | ICD-10-CM | POA: Insufficient documentation

## 2017-01-13 LAB — PREGNANCY, URINE: Preg Test, Ur: NEGATIVE

## 2017-01-13 LAB — URINALYSIS, ROUTINE W REFLEX MICROSCOPIC
Bilirubin Urine: NEGATIVE
Glucose, UA: NEGATIVE mg/dL
Hgb urine dipstick: NEGATIVE
KETONES UR: NEGATIVE mg/dL
LEUKOCYTES UA: NEGATIVE
NITRITE: NEGATIVE
Protein, ur: NEGATIVE mg/dL
Specific Gravity, Urine: 1.01 (ref 1.005–1.030)
pH: 7.5 (ref 5.0–8.0)

## 2017-01-13 LAB — WET PREP, GENITAL
Sperm: NONE SEEN
TRICH WET PREP: NONE SEEN
Yeast Wet Prep HPF POC: NONE SEEN

## 2017-01-13 MED ORDER — FLUCONAZOLE 200 MG PO TABS: 200.0000 mg | ORAL_TABLET | Freq: Every day | ORAL | 0 refills | Status: AC

## 2017-01-13 MED ORDER — ESOMEPRAZOLE MAGNESIUM 40 MG PO CPDR: 40.0000 mg | DELAYED_RELEASE_CAPSULE | Freq: Every day | ORAL | 0 refills | Status: DC

## 2017-01-13 MED ORDER — METRONIDAZOLE 0.75 % VA GEL: 1.0000 | Freq: Two times a day (BID) | VAGINAL | 0 refills | Status: AC

## 2017-01-13 MED ORDER — DOXYCYCLINE HYCLATE 100 MG PO CAPS: 100.0000 mg | ORAL_CAPSULE | Freq: Two times a day (BID) | ORAL | 0 refills | Status: DC

## 2017-01-13 MED ORDER — AZITHROMYCIN 250 MG PO TABS
1000.0000 mg | ORAL_TABLET | Freq: Once | ORAL | Status: AC
Start: 2017-01-13 — End: 2017-01-13
  Administered 2017-01-13: 1000 mg via ORAL
  Filled 2017-01-13: qty 4

## 2017-01-13 MED ORDER — CEFTRIAXONE SODIUM 250 MG IJ SOLR
250.0000 mg | Freq: Once | INTRAMUSCULAR | Status: AC
  Administered 2017-01-13: 250 mg via INTRAMUSCULAR
  Filled 2017-01-13: qty 250

## 2017-01-13 NOTE — Discharge Instructions (Signed)
Use metrogel twice a day for 5 days.  Take doxycycline twice a day for 2 weeks.  Take diflucan after finishing the doxycycline.  You will receive a phone call in 3 days if your STD results are positive. If they're positive for gonorrhea or chlamydia, you do not need further treatment. If it's positive for HIV or syphilis, you need to follow up with ob/gyn, primary care, or the health department. If the results are negative, you will not receive a phone call.  Take nexium once a day. Eat small meals, and remain upright for 60 minutes after eating.   Take tylenol or ibuprofen as needed for pain.  Follow up with [place] [facility] wellness if your symptoms are not improving and to establish primary care.  Follow up with [facility] for assistance with anxiety and depression.  Return to the ER if you develop fevers while on antibiotics, worsening pain, or any new or worsening symptoms. Return immediately if you develop suicidal thoughts.

## 2017-01-13 NOTE — ED Notes (Signed)
Pt given water to drink to attempt to obtain urine sample

## 2017-01-13 NOTE — ED Triage Notes (Signed)
Pt reports left side chest pain x 1 month. Pain also in her back and left side. States she gets SOB with walking and has been coughing up dark mucous at night

## 2017-01-13 NOTE — ED Notes (Signed)
Patient unable to void at present.  Transported to xray.

## 2017-01-13 NOTE — ED Provider Notes (Signed)
[facility] DEPT [facility] Provider Note   CSN: [id number] Arrival date & time: 01/13/17  2671     History   Chief Complaint Chief Complaint  Patient presents with  . Chest Pain    HPI Rhonda Mcgee is a 31 y.o. female presenting with one-month history of left chest pain and three-week history of suprapubic discomfort.  Patient states that for the past month, she's had left chest pain that begins in her upper chest and comes down to her left side. It is present when she coughs and hurts worse when she takes a deep breath. She denies pain at rest. She denies fall, trauma, or injury. She states she has been coughing, especially at night. She denies fever, chills, sore throat, shortness of breath. She denies cardiac or pulmonary history. She denies sick contacts. She reports some epigastric discomfort, worse while lying down. She has a history of heartburn, but does not take anything for it. Additionally, patient reporting several week history of suprapubic discomfort and burning. Patient states that she has sex with her landlord in order to keep the apartment. She does not use protection for birth control. LMP was 3 weeks ago and it was normal. Since then, she's had suprapubic discomfort and burning. It is not worse with urination or bowel movements. She denies urinary frequency, urinary urgency, or hematuria. She denies vaginal discharge, but reports that she has had a different odor the past several weeks. She has had frequent vaginal infections. She would like to be tested for STDs today.  Additionally, patient reports increasing depression and anxiety. She has never taken anything for this in the past. She denies suicidal thoughts or ideations, homicidal thoughts or ideations, or auditory or visual hallucinations.  HPI  Past Medical History:  Diagnosis Date  . Chlamydia   . Gonorrhea   . Herpes genitalia   . Lupus   . Rheumatoid arthritis(714.0)     There are no active problems  to display for this patient.   Past Surgical History:  Procedure Laterality Date  . ADENOIDECTOMY    . CHOLECYSTECTOMY    . TONSILLECTOMY    . TUBAL LIGATION      OB History    No data available       Home Medications    Prior to Admission medications   Medication Sig Start Date End Date Taking? Authorizing Provider  doxycycline (VIBRAMYCIN) 100 MG capsule Take 1 capsule (100 mg total) by mouth 2 (two) times daily. 01/13/17 01/27/17  Norval Slaven, PA-C  esomeprazole (NEXIUM) 40 MG capsule Take 1 capsule (40 mg total) by mouth daily. 01/13/17   Jep Dyas, PA-C  fluconazole (DIFLUCAN) 200 MG tablet Take 1 tablet (200 mg total) by mouth daily. 01/13/17 01/14/17  Veena Sturgess, PA-C  HYDROcodone-acetaminophen (NORCO/VICODIN) 5-325 MG tablet Take 1 tablet by mouth every 4 (four) hours as needed. 02/25/16   Tegeler, Gwenyth Allegra, MD  metroNIDAZOLE (FLAGYL) 500 MG tablet Take 1 tablet (500 mg total) by mouth 2 (two) times daily. One po bid x 7 days 04/10/16   Deno Etienne, DO  metroNIDAZOLE (FLAGYL) 500 MG tablet Take 1 tablet (500 mg total) by mouth 2 (two) times daily. One po bid x 7 days 06/14/16   Street, [place], PA-C  metroNIDAZOLE (METROGEL VAGINAL) 0.75 % vaginal gel Place 1 Applicatorful vaginally 2 (two) times daily. 01/13/17 01/18/17  Carmelite Violet, PA-C  ondansetron (ZOFRAN ODT) 8 MG disintegrating tablet Take 1 tablet (8 mg total) by mouth every 8 (eight) hours as  needed for nausea or vomiting. 06/14/16   Street, Mercedes, PA-C  ondansetron (ZOFRAN) 4 MG tablet Take 1 tablet (4 mg total) by mouth every 8 (eight) hours as needed for nausea or vomiting. 02/25/16   Tegeler, Gwenyth Allegra, MD    Family History No family history on file.  Social History Social History  Substance Use Topics  . Smoking status: Current Some Day Smoker    Types: Cigarettes  . Smokeless tobacco: Never Used  . Alcohol use Yes     Comment: occasional     Allergies    Cephalexin   Review of Systems Review of Systems  Constitutional: Negative for chills and fever.  HENT: Negative for congestion and sore throat.   Respiratory: Positive for cough. Negative for chest tightness and shortness of breath.   Cardiovascular: Positive for chest pain. Negative for leg swelling.  Gastrointestinal: Negative for abdominal pain, blood in stool, constipation, diarrhea, nausea and vomiting.  Genitourinary: Positive for pelvic pain. Negative for dysuria, frequency, hematuria and vaginal discharge.  Psychiatric/Behavioral: Positive for dysphoric mood. Negative for self-injury and suicidal ideas.     Physical Exam Updated Vital Signs BP 118/68 (BP Location: Right Arm)   Pulse 82   Temp 98.4 F (36.9 C) (Oral)   Resp 18   Ht 5\' 7"  (1.702 m)   Wt 90 kg (198 lb 6.6 oz)   LMP 12/29/2016   SpO2 97%   BMI 31.08 kg/m   Physical Exam  Constitutional: She is oriented to person, place, and time. She appears well-developed and well-nourished. No distress.  HENT:  Head: Normocephalic and atraumatic.  Eyes: EOM are normal.  Neck: Normal range of motion.  Cardiovascular: Normal rate, regular rhythm and intact distal pulses.   Pulmonary/Chest: Effort normal and breath sounds normal. No respiratory distress. She has no decreased breath sounds. She has no wheezes. She has no rhonchi. She has no rales. She exhibits tenderness.  Tenderness to palpation of entire left chest wall. No tenderness of right wall. Breath sounds clear in all lung fields.  Abdominal: Soft. She exhibits no distension. There is tenderness in the suprapubic area. There is no rigidity, no rebound, no guarding, no CVA tenderness, no tenderness at McBurney's point and negative Murphy's sign.  Minimal tenderness to palpation of suprapubic area. No tenderness elsewhere in the abdomen. No distention, rigidity, rebound.  Genitourinary: Rectum normal and vagina normal. Pelvic exam was performed with patient  supine. There is no rash, tenderness or lesion on the right labia. There is no rash, tenderness or lesion on the left labia. Uterus is tender. Cervix exhibits no discharge. Right adnexum displays no mass, no tenderness and no fullness. Left adnexum displays no mass, no tenderness and no fullness.  Genitourinary Comments: Chaperone present. Cervix appears inflamed. No obvious discharge or bleeding. No obvious masses. Patient reports lots of pain with manual exam, not worse with side to side motion of cervix.  Musculoskeletal: Normal range of motion.  Lymphadenopathy:    She has no cervical adenopathy.  Neurological: She is alert and oriented to person, place, and time.  Skin: Skin is warm. No rash noted.  Psychiatric: She has a normal mood and affect.  Nursing note and vitals reviewed.    ED Treatments / Results  Labs (all labs ordered are listed, but only abnormal results are displayed) Labs Reviewed  WET PREP, GENITAL - Abnormal; Notable for the following:       Result Value   Clue Cells Wet Prep HPF POC PRESENT (*)  WBC, Wet Prep HPF POC MANY (*)    All other components within normal limits  PREGNANCY, URINE  URINALYSIS, ROUTINE W REFLEX MICROSCOPIC  RPR  HIV ANTIBODY (ROUTINE TESTING)  GC/CHLAMYDIA PROBE AMP ([place]) NOT AT [facility]    EKG  EKG Interpretation  Date/Time:  Monday January 13 2017 08:38:08 EDT Ventricular Rate:  75 PR Interval:    QRS Duration: 76 QT Interval:  366 QTC Calculation: 409 R Axis:   47 Text Interpretation:  Sinus rhythm Probable left atrial enlargement No acute changes No significant change since last tracing Confirmed by Varney Biles ([id number]) on 01/13/2017 10:33:18 AM       Radiology Dg Chest 2 View  Result Date: 01/13/2017 CLINICAL DATA:  Left chest pain x1 month EXAM: CHEST  2 VIEW COMPARISON:  09/17/2016 FINDINGS: Lungs are clear.  No pleural effusion or pneumothorax. The heart is normal in size. Visualized osseous structures are  within normal limits. IMPRESSION: Normal chest radiographs. Electronically Signed   By: Julian Hy M.D.   On: 01/13/2017 09:43    Procedures Procedures (including critical care time)  Medications Ordered in ED Medications  cefTRIAXone (ROCEPHIN) injection 250 mg (250 mg Intramuscular Given 01/13/17 1109)  azithromycin (ZITHROMAX) tablet 1,000 mg (1,000 mg Oral Given 01/13/17 1109)     Initial Impression / Assessment and Plan / ED Course  I have reviewed the triage vital signs and the nursing notes.  Pertinent labs & imaging results that were available during my care of the patient were reviewed by me and considered in my medical decision making (see chart for details).     Patient presenting with multiple complaints. Chest pain has been going on for one month. It is of the left upper chest, radiating to the left side. Pain is present when she coughs and with palpation. She denies  associated shortness of breath, nausea, or diaphoresis. Patient with a HEAR score of 0. Doubt cardiac cause for her pain. Will order chest x-ray to ensure no infection, as patient has had cough. Patient with tenderness to palpation of chest wall, likely musculoskeletal. Additionally, patient reporting vaginal odor and suprapubic discomfort. Likely because of her STDs. Physical exam showed inflamed cervix. It is not friable. No obvious cervical motion tenderness, but pain throughout bimanual exam. Will order wet prep, UA, pregnancy, and STD testing for futher evaluation.   Chest x-ray negative for infiltrate, pneumothorax, or obvious injury. Likely muscular skeletal chest pain. Patient nighttime coughing likely related to GERD. She reports intermittent heartburn symptoms. Will trial Nexium. Wet prep positive for clue cells. Pregnancy negative, UA negative for infection. Considering exam, will cover for PID/cervicitis. Will give MetroGel for BV. Doxy for PID. Discussed findings with patient, patient would like to  be treated for gonorrhea and chlamydia today. Additionally, patient states she gets yeast infections when she takes antibiotics. Will give 1 dose of Diflucan for after antibiotic treatment. She understands the labs are pending for gonorrhea, chlamydia, HIV, and syphilis. Discussed follow-up with [facility] for depression. Discussed need to return to emergency room immediately if she develops suicidal thoughts. At this time, patient appears safe for discharge. Return precautions given. Patient states she understands and agrees to plan.  Final Clinical Impressions(s) / ED Diagnoses   Final diagnoses:  BV (bacterial vaginosis)  Possible exposure to STD  Gastroesophageal reflux disease, esophagitis presence not specified    New Prescriptions Discharge Medication List as of 01/13/2017 11:13 AM    START taking these medications   Details  doxycycline (VIBRAMYCIN) 100 MG capsule Take 1 capsule (100 mg total) by mouth 2 (two) times daily., Starting Mon 01/13/2017, Until Mon 01/27/2017, Print    esomeprazole (NEXIUM) 40 MG capsule Take 1 capsule (40 mg total) by mouth daily., Starting Mon 01/13/2017, Print    metroNIDAZOLE (METROGEL VAGINAL) 0.75 % vaginal gel Place 1 Applicatorful vaginally 2 (two) times daily., Starting Mon 01/13/2017, Until Sat 01/18/2017, Print         [place], [place], PA-C 01/13/17 [place], Ankit, MD 01/14/17 1424

## 2017-01-14 LAB — HIV ANTIBODY (ROUTINE TESTING W REFLEX): HIV Screen 4th Generation wRfx: NONREACTIVE

## 2017-01-14 LAB — RPR, QUANT+TP ABS (REFLEX): T Pallidum Abs: NEGATIVE

## 2017-01-14 LAB — GC/CHLAMYDIA PROBE AMP (~~LOC~~) NOT AT ARMC
CHLAMYDIA, DNA PROBE: NEGATIVE
NEISSERIA GONORRHEA: NEGATIVE

## 2017-01-14 LAB — RPR: RPR: REACTIVE — AB

## 2017-01-22 ENCOUNTER — Emergency Department (HOSPITAL_BASED_OUTPATIENT_CLINIC_OR_DEPARTMENT_OTHER)
Admission: EM | Admit: 2017-01-22 | Discharge: 2017-01-22 | Disposition: A | Discharge status: Home / Self Care | Payer: Self-pay | Attending: Emergency Medicine | Admitting: Emergency Medicine

## 2017-01-22 ENCOUNTER — Encounter (HOSPITAL_BASED_OUTPATIENT_CLINIC_OR_DEPARTMENT_OTHER): Payer: Self-pay

## 2017-01-22 DIAGNOSIS — M069 Rheumatoid arthritis, unspecified: Secondary | ICD-10-CM | POA: Insufficient documentation

## 2017-01-22 DIAGNOSIS — B9689 Other specified bacterial agents as the cause of diseases classified elsewhere: Secondary | ICD-10-CM | POA: Insufficient documentation

## 2017-01-22 DIAGNOSIS — Z79899 Other long term (current) drug therapy: Secondary | ICD-10-CM | POA: Insufficient documentation

## 2017-01-22 DIAGNOSIS — M329 Systemic lupus erythematosus, unspecified: Secondary | ICD-10-CM | POA: Insufficient documentation

## 2017-01-22 DIAGNOSIS — R0789 Other chest pain: Secondary | ICD-10-CM | POA: Insufficient documentation

## 2017-01-22 DIAGNOSIS — N760 Acute vaginitis: Secondary | ICD-10-CM | POA: Insufficient documentation

## 2017-01-22 DIAGNOSIS — F17210 Nicotine dependence, cigarettes, uncomplicated: Secondary | ICD-10-CM | POA: Insufficient documentation

## 2017-01-22 MED ORDER — HYDROCODONE-ACETAMINOPHEN 5-325 MG PO TABS: 1.0000 | ORAL_TABLET | Freq: Four times a day (QID) | ORAL | 0 refills | Status: DC | PRN

## 2017-01-22 MED ORDER — METRONIDAZOLE 500 MG PO TABS: 500.0000 mg | ORAL_TABLET | Freq: Two times a day (BID) | ORAL | 0 refills | Status: DC

## 2017-01-22 MED ORDER — OMEPRAZOLE MAGNESIUM 20 MG PO TBEC: 20.0000 mg | DELAYED_RELEASE_TABLET | Freq: Every day | ORAL | Status: DC

## 2017-01-22 NOTE — ED Notes (Signed)
Prescriptions and cost of prescriptions discussed with patient as well as OTC alternatives if she cannot afford the pain med or prilosec.  Pt verbalizes understanding, denies further needs at this time.

## 2017-01-22 NOTE — ED Notes (Signed)
It was discussed with Dr. Florina Ou, and pt's labs were reviewed, pt does not have syphilis.  Nurse notified pt, and asked if she was still taking the abx prescribed at the last visit.  Pt stated that the prescriptions were too costly when they were filled by [facility] pharmacy, and that she would need new prescriptions if they were still needed.  Pt denies any vaginal discharge, denies any pelvic pain.

## 2017-01-22 NOTE — ED Provider Notes (Signed)
[place] DEPT [facility] Provider Note: Georgena Spurling, MD, FACEP  CSN: [id number] MRN: [id number] ARRIVAL: 01/22/17 at [place]: [place]  01/22/17 3:51 AM Rhonda Mcgee is a 31 y.o. female who was seen on the 17th of this month for left lower posterior rib pain as well as suprapubic pain. She was diagnosed with cervicitis and bacterial vaginosis. She was treated in the ED with Rocephin IM and Zithromax orally. She was given prescriptions for doxycycline and Flagyl but could not afford to get these filled.  She returns with worsening pain in her left lower posterior ribs. She rates her pain as an 8 out of 10, worse with movement or palpation. She has been taking ibuprofen 800 milligrams without relief. She is not short of breath. Her suprapubic pain has resolved and her vaginal discharge has improved. She was contacted and told that her RPR was positive and that she needed to be treated for syphilis. A review of her records shows that she has had multiple positive RPRs but always with negative Treponema palladium confirmatory tests.   Consultation with the [facility] state controlled substances database reveals the patient has received a single prescription for hydrocodone in October of last year.    Past Medical History:  Diagnosis Date  . Chlamydia   . Gonorrhea   . Herpes genitalia   . Lupus   . Rheumatoid arthritis(714.0)     Past Surgical History:  Procedure Laterality Date  . ADENOIDECTOMY    . CHOLECYSTECTOMY    . TONSILLECTOMY    . TUBAL LIGATION      No family history on file.  Social History  Substance Use Topics  . Smoking status: Current Some Day Smoker    Types: Cigarettes  . Smokeless tobacco: Never Used  . Alcohol use Yes     Comment: occasional    Prior to Admission medications   Medication Sig Start Date End Date Taking? Authorizing Provider  HYDROcodone-acetaminophen  (NORCO/VICODIN) 5-325 MG tablet Take 1 tablet by mouth every 6 (six) hours as needed (for rib pain). 01/22/17   Duriel Deery, MD  metroNIDAZOLE (FLAGYL) 500 MG tablet Take 1 tablet (500 mg total) by mouth 2 (two) times daily. One po bid x 7 days 06/14/16   Street, [place], PA-C  metroNIDAZOLE (FLAGYL) 500 MG tablet Take 1 tablet (500 mg total) by mouth 2 (two) times daily. One po bid x 7 days 01/22/17   Evee Liska, MD  omeprazole (PRILOSEC OTC) 20 MG tablet Take 1 tablet (20 mg total) by mouth daily. 01/22/17   Kaidin Boehle, MD  ondansetron (ZOFRAN ODT) 8 MG disintegrating tablet Take 1 tablet (8 mg total) by mouth every 8 (eight) hours as needed for nausea or vomiting. 06/14/16   Street, [place], PA-C    Allergies Cephalexin   REVIEW OF SYSTEMS  Negative except as noted here or in the History of Present Illness.   PHYSICAL EXAMINATION  Initial Vital Signs Blood pressure 96/71, pulse 92, temperature 98.4 F (36.9 C), temperature source Oral, resp. rate 16, height 5\' 5"  (1.651 m), weight 86.2 kg (190 lb), last menstrual period 01/18/2017, SpO2 99 %.  Examination General: Well-developed, well-nourished female in no acute distress; appearance consistent with age of record HENT: normocephalic; atraumatic Eyes: pupils equal, round and reactive to light; extraocular muscles intact Neck: supple Heart: regular rate and rhythm Lungs: clear to auscultation bilaterally Chest: Left lower posterior rib tenderness without  crepitus Abdomen: soft; nondistended; nontender; no masses or hepatosplenomegaly; bowel sounds present Extremities: No deformity; full range of motion; pulses normal Neurologic: Awake, alert and oriented; motor function intact in all extremities and symmetric; no facial droop Skin: Warm and dry Psychiatric: Normal mood and affect   RESULTS  Summary of this visit's results, reviewed by myself:   EKG Interpretation  Date/Time:    Ventricular Rate:    PR Interval:    QRS  Duration:   QT Interval:    QTC Calculation:   R Axis:     Text Interpretation:        Laboratory Studies: No results found for this or any previous visit (from the past 24 hour(s)). Imaging Studies: No results found.  ED COURSE  Nursing notes and initial vitals signs, including pulse oximetry, reviewed.  Vitals:   01/22/17 0213 01/22/17 0215  BP:  96/71  Pulse:  92  Resp:  16  Temp:  98.4 F (36.9 C)  TempSrc:  Oral  SpO2:  99%  Weight: 86.2 kg (190 lb)   Height: 5\' 5"  (1.651 m)    4:07 AM The patient was explained that she has never had a positive confirmatory test for syphilis I do not believe treatment for syphilis is indicated at this time. Her GC and chlamydia from her previous visit were negative and she was treated with Rocephin and Zithromax with subsequent improvement. She did not receive treatment for bacterial vaginosis. I will write her for oral Flagyl and treat her rib pain but again I do not believe the doxycycline is indicated at this time.  PROCEDURES    ED DIAGNOSES     ICD-10-CM   1. Chest wall pain R07.89   2. BV (bacterial vaginosis) N76.0    B96.89        Mc[place]ur Ivins, Jenny Reichmann, MD 01/22/17 [phone]

## 2017-01-22 NOTE — ED Triage Notes (Signed)
Pt c/o left side back pain that has not gone away from previous visit, as well as receiving a phone call telling her to come back to the ED for treatment of syphilis.

## 2017-09-29 ENCOUNTER — Emergency Department (HOSPITAL_BASED_OUTPATIENT_CLINIC_OR_DEPARTMENT_OTHER): Payer: Self-pay

## 2017-09-29 ENCOUNTER — Emergency Department (HOSPITAL_BASED_OUTPATIENT_CLINIC_OR_DEPARTMENT_OTHER)
Admission: EM | Admit: 2017-09-29 | Discharge: 2017-09-29 | Disposition: A | Discharge status: Home / Self Care | Payer: Self-pay | Attending: Emergency Medicine | Admitting: Emergency Medicine

## 2017-09-29 ENCOUNTER — Other Ambulatory Visit: Payer: Self-pay

## 2017-09-29 ENCOUNTER — Encounter (HOSPITAL_BASED_OUTPATIENT_CLINIC_OR_DEPARTMENT_OTHER): Payer: Self-pay | Admitting: Emergency Medicine

## 2017-09-29 DIAGNOSIS — Z3202 Encounter for pregnancy test, result negative: Secondary | ICD-10-CM | POA: Insufficient documentation

## 2017-09-29 DIAGNOSIS — Z79899 Other long term (current) drug therapy: Secondary | ICD-10-CM | POA: Insufficient documentation

## 2017-09-29 DIAGNOSIS — R102 Pelvic and perineal pain: Secondary | ICD-10-CM

## 2017-09-29 DIAGNOSIS — R059 Cough, unspecified: Secondary | ICD-10-CM

## 2017-09-29 DIAGNOSIS — R197 Diarrhea, unspecified: Secondary | ICD-10-CM | POA: Insufficient documentation

## 2017-09-29 DIAGNOSIS — N760 Acute vaginitis: Secondary | ICD-10-CM | POA: Insufficient documentation

## 2017-09-29 DIAGNOSIS — R070 Pain in throat: Secondary | ICD-10-CM | POA: Insufficient documentation

## 2017-09-29 DIAGNOSIS — J029 Acute pharyngitis, unspecified: Secondary | ICD-10-CM

## 2017-09-29 DIAGNOSIS — R112 Nausea with vomiting, unspecified: Secondary | ICD-10-CM | POA: Insufficient documentation

## 2017-09-29 DIAGNOSIS — R05 Cough: Secondary | ICD-10-CM | POA: Insufficient documentation

## 2017-09-29 DIAGNOSIS — F17210 Nicotine dependence, cigarettes, uncomplicated: Secondary | ICD-10-CM | POA: Insufficient documentation

## 2017-09-29 DIAGNOSIS — N72 Inflammatory disease of cervix uteri: Secondary | ICD-10-CM | POA: Insufficient documentation

## 2017-09-29 DIAGNOSIS — B9689 Other specified bacterial agents as the cause of diseases classified elsewhere: Secondary | ICD-10-CM

## 2017-09-29 LAB — CBC WITH DIFFERENTIAL/PLATELET
BASOS ABS: 0 10*3/uL (ref 0.0–0.1)
BASOS PCT: 0 %
Eosinophils Absolute: 0.1 10*3/uL (ref 0.0–0.7)
Eosinophils Relative: 2 %
HEMATOCRIT: 37.6 % (ref 36.0–46.0)
HEMOGLOBIN: 12.7 g/dL (ref 12.0–15.0)
Lymphocytes Relative: 48 %
Lymphs Abs: 2.4 10*3/uL (ref 0.7–4.0)
MCH: 27.4 pg (ref 26.0–34.0)
MCHC: 33.8 g/dL (ref 30.0–36.0)
MCV: 81.2 fL (ref 78.0–100.0)
MONOS PCT: 13 %
Monocytes Absolute: 0.7 10*3/uL (ref 0.1–1.0)
NEUTROS ABS: 1.9 10*3/uL (ref 1.7–7.7)
NEUTROS PCT: 37 %
Platelets: 290 10*3/uL (ref 150–400)
RBC: 4.63 MIL/uL (ref 3.87–5.11)
RDW: 14.4 % (ref 11.5–15.5)
WBC: 5.1 10*3/uL (ref 4.0–10.5)

## 2017-09-29 LAB — COMPREHENSIVE METABOLIC PANEL
ALBUMIN: 3.8 g/dL (ref 3.5–5.0)
ALK PHOS: 88 U/L (ref 38–126)
ALT: 19 U/L (ref 14–54)
AST: 24 U/L (ref 15–41)
Anion gap: 6 (ref 5–15)
BILIRUBIN TOTAL: 1 mg/dL (ref 0.3–1.2)
BUN: 7 mg/dL (ref 6–20)
CALCIUM: 8.6 mg/dL — AB (ref 8.9–10.3)
CO2: 26 mmol/L (ref 22–32)
Chloride: 106 mmol/L (ref 101–111)
Creatinine, Ser: 0.82 mg/dL (ref 0.44–1.00)
GFR calc Af Amer: >60 mL/min (ref >60)
GFR calc non Af Amer: >60 mL/min (ref >60)
GLUCOSE: 83 mg/dL (ref 65–99)
Potassium: 3.8 mmol/L (ref 3.5–5.1)
SODIUM: 138 mmol/L (ref 135–145)
TOTAL PROTEIN: 7.7 g/dL (ref 6.5–8.1)

## 2017-09-29 LAB — URINALYSIS, ROUTINE W REFLEX MICROSCOPIC
Bilirubin Urine: NEGATIVE
Glucose, UA: NEGATIVE mg/dL
Hgb urine dipstick: NEGATIVE
KETONES UR: NEGATIVE mg/dL
LEUKOCYTES UA: NEGATIVE
NITRITE: NEGATIVE
PH: 7.5 (ref 5.0–8.0)
PROTEIN: NEGATIVE mg/dL
Specific Gravity, Urine: <1.005 — ABNORMAL LOW (ref 1.005–1.030)

## 2017-09-29 LAB — WET PREP, GENITAL
SPERM: NONE SEEN
TRICH WET PREP: NONE SEEN
Yeast Wet Prep HPF POC: NONE SEEN

## 2017-09-29 LAB — LIPASE, BLOOD: Lipase: 26 U/L (ref 11–51)

## 2017-09-29 LAB — PREGNANCY, URINE: Preg Test, Ur: NEGATIVE

## 2017-09-29 LAB — RAPID STREP SCREEN (MED CTR MEBANE ONLY): Streptococcus, Group A Screen (Direct): NEGATIVE

## 2017-09-29 MED ORDER — LIDOCAINE HCL (PF) 1 % IJ SOLN
INTRAMUSCULAR | Status: AC
  Administered 2017-09-29: 5 mL
  Filled 2017-09-29: qty 5

## 2017-09-29 MED ORDER — ONDANSETRON HCL 4 MG PO TABS: 4.0000 mg | ORAL_TABLET | Freq: Three times a day (TID) | ORAL | 0 refills | Status: DC | PRN

## 2017-09-29 MED ORDER — AZITHROMYCIN 1 G PO PACK
1.0000 g | PACK | Freq: Once | ORAL | Status: AC
  Administered 2017-09-29: 1 g via ORAL
  Filled 2017-09-29: qty 1

## 2017-09-29 MED ORDER — METRONIDAZOLE 500 MG PO TABS: 500.0000 mg | ORAL_TABLET | Freq: Two times a day (BID) | ORAL | 0 refills | Status: AC

## 2017-09-29 MED ORDER — ONDANSETRON HCL 4 MG/2ML IJ SOLN
4.0000 mg | Freq: Once | INTRAMUSCULAR | Status: AC
  Administered 2017-09-29: 4 mg via INTRAVENOUS
  Filled 2017-09-29: qty 2

## 2017-09-29 MED ORDER — CEFTRIAXONE SODIUM 250 MG IJ SOLR
250.0000 mg | Freq: Once | INTRAMUSCULAR | Status: AC
  Administered 2017-09-29: 250 mg via INTRAMUSCULAR
  Filled 2017-09-29: qty 250

## 2017-09-29 MED ORDER — MORPHINE SULFATE (PF) 4 MG/ML IV SOLN
4.0000 mg | Freq: Once | INTRAVENOUS | Status: AC
  Administered 2017-09-29: 4 mg via INTRAVENOUS
  Filled 2017-09-29: qty 1

## 2017-09-29 MED ORDER — METRONIDAZOLE 500 MG PO TABS
500.0000 mg | ORAL_TABLET | Freq: Once | ORAL | Status: AC
  Administered 2017-09-29: 500 mg via ORAL
  Filled 2017-09-29: qty 1

## 2017-09-29 MED ORDER — SODIUM CHLORIDE 0.9 % IV BOLUS
1000.0000 mL | Freq: Once | INTRAVENOUS | Status: AC
  Administered 2017-09-29: 1000 mL via INTRAVENOUS

## 2017-09-29 NOTE — ED Provider Notes (Signed)
[facility] EMERGENCY DEPARTMENT Provider Note   CSN: [id number] Arrival date & time: 09/29/17  1914     History   Chief Complaint Chief Complaint  Patient presents with  . Abscess  . Sore Throat    HPI Rhonda Mcgee is a 32 y.o. female.  The history is provided by the patient and medical records. No language interpreter was used.  Illness  This is a new problem. The current episode started more than 2 days ago. The problem occurs constantly. The problem has not changed since onset.Associated symptoms include abdominal pain. Pertinent negatives include no chest pain, no headaches and no shortness of breath. Nothing aggravates the symptoms. Nothing relieves the symptoms. She has tried nothing for the symptoms. The treatment provided no relief.  Cough  This is a new problem. The current episode started 2 days ago. The problem occurs constantly. The problem has not changed since onset.The cough is productive of sputum. Maximum temperature: subjective. Associated symptoms include chills. Pertinent negatives include no chest pain, no headaches, no shortness of breath and no wheezing. She has tried nothing for the symptoms. The treatment provided no relief.    Past Medical History:  Diagnosis Date  . Chlamydia   . Gonorrhea   . Herpes genitalia   . Lupus   . Rheumatoid arthritis(714.0)     There are no active problems to display for this patient.   Past Surgical History:  Procedure Laterality Date  . ADENOIDECTOMY    . CHOLECYSTECTOMY    . TONSILLECTOMY    . TUBAL LIGATION       OB History   None      Home Medications    Prior to Admission medications   Medication Sig Start Date End Date Taking? Authorizing Provider  HYDROcodone-acetaminophen (NORCO/VICODIN) 5-325 MG tablet Take 1 tablet by mouth every 6 (six) hours as needed (for rib pain). 01/22/17   Molpus, John, MD  metroNIDAZOLE (FLAGYL) 500 MG tablet Take 1 tablet (500 mg total) by mouth 2 (two)  times daily. One po bid x 7 days 06/14/16   Street, [place], PA-C  metroNIDAZOLE (FLAGYL) 500 MG tablet Take 1 tablet (500 mg total) by mouth 2 (two) times daily. One po bid x 7 days 01/22/17   Molpus, John, MD  omeprazole (PRILOSEC OTC) 20 MG tablet Take 1 tablet (20 mg total) by mouth daily. 01/22/17   Molpus, John, MD  ondansetron (ZOFRAN ODT) 8 MG disintegrating tablet Take 1 tablet (8 mg total) by mouth every 8 (eight) hours as needed for nausea or vomiting. 06/14/16   Street, [place], PA-C    Family History No family history on file.  Social History Social History   Tobacco Use  . Smoking status: Current Some Day Smoker    Types: Cigarettes  . Smokeless tobacco: Never Used  Substance Use Topics  . Alcohol use: Yes    Comment: occasional  . Drug use: No     Allergies   Cephalexin   Review of Systems Review of Systems  Constitutional: Positive for chills. Negative for fatigue and fever.  HENT: Negative for congestion.   Respiratory: Positive for cough. Negative for chest tightness, shortness of breath, wheezing and stridor.   Cardiovascular: Negative for chest pain and palpitations.  Gastrointestinal: Positive for abdominal pain, diarrhea, nausea and vomiting. Negative for constipation.  Genitourinary: Positive for vaginal discharge. Negative for dysuria, flank pain, frequency and vaginal pain.  Musculoskeletal: Negative for back pain, neck pain and neck stiffness.  Neurological:  Negative for light-headedness and headaches.  Psychiatric/Behavioral: Negative for agitation and confusion.  All other systems reviewed and are negative.    Physical Exam Updated Vital Signs There were no vitals taken for this visit.  Physical Exam  Constitutional: She appears well-developed and well-nourished. No distress.  HENT:  Head: Normocephalic and atraumatic.  Mouth/Throat: Oropharynx is clear and moist. No oropharyngeal exudate.  Eyes: Pupils are equal, round, and reactive to  light. Conjunctivae and EOM are normal.  Neck: Normal range of motion. Neck supple.  Cardiovascular: Normal rate and regular rhythm.  No murmur heard. Pulmonary/Chest: Effort normal and breath sounds normal. No respiratory distress. She has no wheezes. She exhibits no tenderness.  Abdominal: Soft. There is no tenderness.  Genitourinary: Cervix exhibits discharge. Cervix exhibits no motion tenderness and no friability. Right adnexum displays tenderness. Right adnexum displays no mass. Left adnexum displays tenderness. Left adnexum displays no mass. Vaginal discharge found.  Musculoskeletal: She exhibits no edema or tenderness.  Lymphadenopathy:    She has no cervical adenopathy.  Neurological: She is alert. No sensory deficit. She exhibits normal muscle tone.  Skin: Skin is warm and dry. Capillary refill takes less than 2 seconds. No rash noted. She is not diaphoretic. No erythema.  Psychiatric: She has a normal mood and affect.  Nursing note and vitals reviewed.    ED Treatments / Results  Labs (all labs ordered are listed, but only abnormal results are displayed) Labs Reviewed  WET PREP, GENITAL - Abnormal; Notable for the following components:      Result Value   Clue Cells Wet Prep HPF POC PRESENT (*)    WBC, Wet Prep HPF POC MANY (*)    All other components within normal limits  URINALYSIS, ROUTINE W REFLEX MICROSCOPIC - Abnormal; Notable for the following components:   APPearance HAZY (*)    Specific Gravity, Urine <1.005 (*)    All other components within normal limits  COMPREHENSIVE METABOLIC PANEL - Abnormal; Notable for the following components:   Calcium 8.6 (*)    All other components within normal limits  RAPID STREP SCREEN ([facility] ONLY)  URINE CULTURE  CULTURE, GROUP A STREP [facility])  PREGNANCY, URINE  CBC WITH DIFFERENTIAL/PLATELET  LIPASE, BLOOD  GC/CHLAMYDIA PROBE AMP ([place]) NOT AT [facility]    EKG EKG Interpretation  Date/Time:  Monday September 29 2017  11:17:12 EDT Ventricular Rate:  59 PR Interval:    QRS Duration: 73 QT Interval:  421 QTC Calculation: 417 R Axis:   64 Text Interpretation:  Sinus rhythm Baseline wander in lead(s) V5 When compared to prior, no signifiacnt changes seen.  No STEMI Confirmed by Antony Blackbird [id number]) on 09/29/2017 12:26:37 PM   Radiology Dg Chest 2 View  Result Date: 09/29/2017 CLINICAL DATA:  Cough, fever, and chest congestion for the past 4 days. Nonsmoker. EXAM: CHEST - 2 VIEW COMPARISON:  PA and lateral chest x-ray of August 25, 2017 FINDINGS: The lungs are well-expanded. There is no focal infiltrate. There is no pleural effusion. The heart and pulmonary vascularity are normal. The mediastinum is normal in width. The bony thorax is unremarkable. IMPRESSION: There is no active cardiopulmonary disease. Electronically Signed   By: David  Martinique M.D.   On: 09/29/2017 09:27   US Transvaginal Non-ob  Result Date: 09/29/2017 CLINICAL DATA:  32 year old female with lower abdominal, bilateral flank pain and bilateral adnexal pain on pelvic exam. EXAM: TRANSABDOMINAL AND TRANSVAGINAL ULTRASOUND OF PELVIS DOPPLER ULTRASOUND OF OVARIES TECHNIQUE: Both transabdominal and  transvaginal ultrasound examinations of the pelvis were performed. Transabdominal technique was performed for global imaging of the pelvis including uterus, ovaries, adnexal regions, and pelvic cul-de-sac. It was necessary to proceed with endovaginal exam following the transabdominal exam to visualize the ovaries. Color and duplex Doppler ultrasound was utilized to evaluate blood flow to the ovaries. COMPARISON:  CT Abdomen and Pelvis 10/26/2016. Pelvis ultrasound 10/26/2016. FINDINGS: Uterus Measurements: 9.1 x 5.0 x 6.0 centimeters. Questionable subtle 7 millimeter posterior intramural fibroid on image 52. Otherwise normal myometrium. Endometrium Thickness: 6 millimeters. There is a small 2-3 millimeter cystic area within the endometrium demonstrated on image  68. No other endometrial abnormality. Right ovary Measurements: 3.5 x 2.0 x 2.6 centimeters. Multiple small follicles. Normal appearance/no adnexal mass. Left ovary Measurements: 3.6 x 2.4 x 2.6 centimeters. Multiple small follicles. Normal appearance/no adnexal mass. Pulsed Doppler evaluation of both ovaries demonstrates normal low-resistance arterial and venous waveforms. Other findings Trace simple appearing pelvic free fluid (image 59). IMPRESSION: 1. Negative for ovarian torsion. Physiologic appearance of both ovaries. 2. Tiny 2-3 millimeters cystic area within the endometrium at the fundus is of unclear significance. Recommend correlation with pregnancy test. 3. Questionable 7 millimeter posterior intramural fibroid. 4. Small volume of physiologic appearing pelvic free fluid. Electronically Signed   By: Genevie Ann M.D.   On: 09/29/2017 12:16   US Pelvis Complete  Result Date: 09/29/2017 CLINICAL DATA:  32 year old female with lower abdominal, bilateral flank pain and bilateral adnexal pain on pelvic exam. EXAM: TRANSABDOMINAL AND TRANSVAGINAL ULTRASOUND OF PELVIS DOPPLER ULTRASOUND OF OVARIES TECHNIQUE: Both transabdominal and transvaginal ultrasound examinations of the pelvis were performed. Transabdominal technique was performed for global imaging of the pelvis including uterus, ovaries, adnexal regions, and pelvic cul-de-sac. It was necessary to proceed with endovaginal exam following the transabdominal exam to visualize the ovaries. Color and duplex Doppler ultrasound was utilized to evaluate blood flow to the ovaries. COMPARISON:  CT Abdomen and Pelvis 10/26/2016. Pelvis ultrasound 10/26/2016. FINDINGS: Uterus Measurements: 9.1 x 5.0 x 6.0 centimeters. Questionable subtle 7 millimeter posterior intramural fibroid on image 52. Otherwise normal myometrium. Endometrium Thickness: 6 millimeters. There is a small 2-3 millimeter cystic area within the endometrium demonstrated on image 68. No other endometrial  abnormality. Right ovary Measurements: 3.5 x 2.0 x 2.6 centimeters. Multiple small follicles. Normal appearance/no adnexal mass. Left ovary Measurements: 3.6 x 2.4 x 2.6 centimeters. Multiple small follicles. Normal appearance/no adnexal mass. Pulsed Doppler evaluation of both ovaries demonstrates normal low-resistance arterial and venous waveforms. Other findings Trace simple appearing pelvic free fluid (image 59). IMPRESSION: 1. Negative for ovarian torsion. Physiologic appearance of both ovaries. 2. Tiny 2-3 millimeters cystic area within the endometrium at the fundus is of unclear significance. Recommend correlation with pregnancy test. 3. Questionable 7 millimeter posterior intramural fibroid. 4. Small volume of physiologic appearing pelvic free fluid. Electronically Signed   By: Genevie Ann M.D.   On: 09/29/2017 12:16   [place] Art/ven Flow Abd Pelv Doppler  Result Date: 09/29/2017 CLINICAL DATA:  32 year old female with lower abdominal, bilateral flank pain and bilateral adnexal pain on pelvic exam. EXAM: TRANSABDOMINAL AND TRANSVAGINAL ULTRASOUND OF PELVIS DOPPLER ULTRASOUND OF OVARIES TECHNIQUE: Both transabdominal and transvaginal ultrasound examinations of the pelvis were performed. Transabdominal technique was performed for global imaging of the pelvis including uterus, ovaries, adnexal regions, and pelvic cul-de-sac. It was necessary to proceed with endovaginal exam following the transabdominal exam to visualize the ovaries. Color and duplex Doppler ultrasound was utilized to evaluate blood flow to the  ovaries. COMPARISON:  CT Abdomen and Pelvis 10/26/2016. Pelvis ultrasound 10/26/2016. FINDINGS: Uterus Measurements: 9.1 x 5.0 x 6.0 centimeters. Questionable subtle 7 millimeter posterior intramural fibroid on image 52. Otherwise normal myometrium. Endometrium Thickness: 6 millimeters. There is a small 2-3 millimeter cystic area within the endometrium demonstrated on image 68. No other endometrial abnormality.  Right ovary Measurements: 3.5 x 2.0 x 2.6 centimeters. Multiple small follicles. Normal appearance/no adnexal mass. Left ovary Measurements: 3.6 x 2.4 x 2.6 centimeters. Multiple small follicles. Normal appearance/no adnexal mass. Pulsed Doppler evaluation of both ovaries demonstrates normal low-resistance arterial and venous waveforms. Other findings Trace simple appearing pelvic free fluid (image 59). IMPRESSION: 1. Negative for ovarian torsion. Physiologic appearance of both ovaries. 2. Tiny 2-3 millimeters cystic area within the endometrium at the fundus is of unclear significance. Recommend correlation with pregnancy test. 3. Questionable 7 millimeter posterior intramural fibroid. 4. Small volume of physiologic appearing pelvic free fluid. Electronically Signed   By: Genevie Ann M.D.   On: 09/29/2017 12:16    Procedures Procedures (including critical care time)  Medications Ordered in ED Medications  sodium chloride 0.9 % bolus 1,000 mL (0 mLs Intravenous Stopped 09/29/17 1000)  morphine 4 MG/ML injection 4 mg (4 mg Intravenous Given 09/29/17 0931)  ondansetron (ZOFRAN) injection 4 mg (4 mg Intravenous Given 09/29/17 0931)  cefTRIAXone (ROCEPHIN) injection 250 mg (250 mg Intramuscular Given 09/29/17 1349)  azithromycin (ZITHROMAX) powder 1 g (1 g Oral Given 09/29/17 1346)  metroNIDAZOLE (FLAGYL) tablet 500 mg (500 mg Oral Given 09/29/17 1349)  lidocaine (PF) (XYLOCAINE) 1 % injection (5 mLs  Given 09/29/17 1349)     Initial Impression / Assessment and Plan / ED Course  I have reviewed the triage vital signs and the nursing notes.  Pertinent labs & imaging results that were available during my care of the patient were reviewed by me and considered in my medical decision making (see chart for details).     Rhonda Mcgee is a 32 y.o. 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  Patient reports that her son was recently admitted to the hospital for several days after he developed sore throat and then had kidney involvement.  Suspect strep glomerulonephritis based on patient description.  Patient is concerned she may have the same thing given her urinary symptoms, flank pain, and sore throat.  She is also concerned about the subjective chills, productive cough, nausea, vomiting, diarrhea.  Patient reports that she has not been eating much over the last few days and may be dehydrated.  She denies hemoptysis.  She denies vaginal bleeding or vaginal discharge but does have lower abdominal pain and some pelvic discomfort.  Patient is also concerned about boils on her bottom that are reportedly draining and painful.  On exam, lungs are clear.  Chest is nontender.  Abdomen is minimally tender in the lower abdomen.  Bilateral CVA tenderness present.  No murmur appreciated.  Patient had erythema on the back of her throat with some exudates although she has had a prior tonsillectomy and adenoidectomy.  Normal neck range of motion.  Patient will have a pelvic exam and a GU exam to look at the boils.    Patient will have laboratory testing performed, chest x-ray for the productive cough, and labs and urinalysis.  Patient was given pain medicine, nausea medicine and fluids during work-up.  GU exam revealed no evidence of abscess in the groin or buttock area.  Patient did have discharge and bilateral adnexal tenderness.   Diagnostic work-up showed evidence of bacterial vaginosis.  Treat empirically for STI for cervicitis.  Ultrasound did not show evidence of TOA but did show evidence of some uterine fibroids.  Patient reports her menstrual cycle is starting any day and this may be the cause of her discomfort.  Chest x-ray showed no pneumonia.  Other laboratory testing  reassuring. Doubt PID.  Suspect patient was anxious due to her son being so sick recently.  Patient given prescription for Flagyl for outpatient BV treatment and will follow up with a PCP for further management.  Patient instructed to stay hydrated.  Patient had no other questions or concerns and was discharged in good condition with improved symptoms.    Final Clinical Impressions(s) / ED Diagnoses   Final diagnoses:  Cervicitis  Pelvic pain  Nausea vomiting and diarrhea  Cough  Sore throat  BV (bacterial vaginosis)    ED Discharge Orders        Ordered    metroNIDAZOLE (FLAGYL) 500 MG tablet  2 times daily     09/29/17 1411    ondansetron (ZOFRAN) 4 MG tablet  Every 8 hours PRN     09/29/17 1411      Clinical Impression: 1. Cervicitis   2. Pelvic pain   3. Nausea vomiting and diarrhea   4. Cough   5. Sore throat   6. BV (bacterial vaginosis)     Disposition: Discharge  Condition: Good  I have discussed the results, Dx and Tx plan with the pt(& family if present). He/she/they expressed understanding and agree(s) with the plan. Discharge instructions discussed at great length. Strict return precautions discussed and pt &/or family have verbalized understanding of the instructions. No further questions at time of discharge.    Discharge Medication List as of 09/29/2017  2:12 PM    START taking these medications   Details  !! metroNIDAZOLE (FLAGYL) 500 MG tablet Take 1 tablet (500 mg total) by mouth 2 (two) times daily for 7 days., Starting Mon 09/29/2017, Until Mon 10/06/2017, Print    ondansetron (ZOFRAN) 4 MG tablet Take 1 tablet (4 mg total) by mouth every 8 (eight) hours as needed for nausea or vomiting., Starting Mon 09/29/2017, Print     !! - Potential duplicate medications found. Please discuss with provider.      Follow Up: [place] [id number] [phone] Schedule an appointment  as soon as possible for a visit    [place] [address] [id number] BT [facility] [place] [phone]       Diedra Sinor, Gwenyth Allegra, MD 09/29/17 [phone]

## 2017-09-29 NOTE — ED Notes (Signed)
ED Provider at bedside. 

## 2017-09-29 NOTE — Discharge Instructions (Signed)
Your work-up today was overall reassuring however we did find evidence of BV and uterine fibroids.  As your menstrual cycle is getting ready to start, we suspect this is related to your pain.  Please use the nausea medicine to help stay hydrated.  You may use over-the-counter pain medication to help with the discomfort.  Please follow-up with a PCP for further management.  We treated you with antibiotics due to the vaginal discharge and the discomfort you had.  Please follow-up with your PCP for other sexual transmitted infection test results.  If any symptoms change or worsen, please return to the nearest emergency department.

## 2017-09-29 NOTE — ED Triage Notes (Signed)
Patient states that she has had boil to her buttocks and a sore throat for several days - the patient is crying in pain

## 2017-09-30 LAB — URINE CULTURE: Culture: <10000 — AB

## 2017-09-30 LAB — GC/CHLAMYDIA PROBE AMP (~~LOC~~) NOT AT ARMC
Chlamydia: NEGATIVE
Neisseria Gonorrhea: NEGATIVE

## 2017-10-01 LAB — CULTURE, GROUP A STREP (THRC)

## 2017-12-05 ENCOUNTER — Other Ambulatory Visit: Payer: Self-pay

## 2017-12-05 ENCOUNTER — Encounter (HOSPITAL_BASED_OUTPATIENT_CLINIC_OR_DEPARTMENT_OTHER): Payer: Self-pay | Admitting: Emergency Medicine

## 2017-12-05 ENCOUNTER — Emergency Department (HOSPITAL_BASED_OUTPATIENT_CLINIC_OR_DEPARTMENT_OTHER)
Admission: EM | Admit: 2017-12-05 | Discharge: 2017-12-05 | Disposition: A | Discharge status: Home / Self Care | Payer: Medicaid Other | Attending: Emergency Medicine | Admitting: Emergency Medicine

## 2017-12-05 DIAGNOSIS — R51 Headache: Secondary | ICD-10-CM | POA: Diagnosis not present

## 2017-12-05 DIAGNOSIS — F17210 Nicotine dependence, cigarettes, uncomplicated: Secondary | ICD-10-CM | POA: Diagnosis not present

## 2017-12-05 DIAGNOSIS — Z79899 Other long term (current) drug therapy: Secondary | ICD-10-CM | POA: Diagnosis not present

## 2017-12-05 DIAGNOSIS — M2550 Pain in unspecified joint: Secondary | ICD-10-CM | POA: Diagnosis not present

## 2017-12-05 DIAGNOSIS — R519 Headache, unspecified: Secondary | ICD-10-CM

## 2017-12-05 LAB — PREGNANCY, URINE: PREG TEST UR: NEGATIVE

## 2017-12-05 MED ORDER — ACETAMINOPHEN 500 MG PO TABS
1000.0000 mg | ORAL_TABLET | Freq: Once | ORAL | Status: AC
  Administered 2017-12-05: 1000 mg via ORAL
  Filled 2017-12-05: qty 2

## 2017-12-05 MED ORDER — CETIRIZINE HCL 10 MG PO TABS: 10.0000 mg | ORAL_TABLET | Freq: Every day | ORAL | 0 refills | Status: DC

## 2017-12-05 MED ORDER — METOCLOPRAMIDE HCL 5 MG/ML IJ SOLN
10.0000 mg | Freq: Once | INTRAMUSCULAR | Status: AC
  Administered 2017-12-05: 10 mg via INTRAVENOUS
  Filled 2017-12-05: qty 2

## 2017-12-05 MED ORDER — DEXAMETHASONE SODIUM PHOSPHATE 10 MG/ML IJ SOLN
10.0000 mg | Freq: Once | INTRAMUSCULAR | Status: AC
  Administered 2017-12-05: 10 mg via INTRAVENOUS
  Filled 2017-12-05: qty 1

## 2017-12-05 MED ORDER — FLUTICASONE PROPIONATE 50 MCG/ACT NA SUSP: 1.0000 | Freq: Every day | NASAL | 2 refills | Status: DC

## 2017-12-05 MED ORDER — DIPHENHYDRAMINE HCL 50 MG/ML IJ SOLN
25.0000 mg | Freq: Once | INTRAMUSCULAR | Status: AC
  Administered 2017-12-05: 25 mg via INTRAVENOUS
  Filled 2017-12-05: qty 1

## 2017-12-05 MED ORDER — SODIUM CHLORIDE 0.9 % IV BOLUS
1000.0000 mL | Freq: Once | INTRAVENOUS | Status: AC
  Administered 2017-12-05: 1000 mL via INTRAVENOUS

## 2017-12-05 NOTE — ED Provider Notes (Signed)
[facility] EMERGENCY DEPARTMENT Provider Note   CSN: [id number] Arrival date & time: 12/05/17  0957     History   Chief Complaint Chief Complaint  Patient presents with  . Headache    HPI Rhonda Mcgee is a 32 y.o. female.  HPI  Patient is a 32 year old female with a history of positive ANA, STI presenting for headache.  Patient reports that she has a history of migraines it feels similar to her left-sided headache today, however it is been many years since she had them.  Patient reports that pain is throbbing in the left side of her head, and she also has diffuse pain on the right side of her body.  Patient reports pain shooting from her right shoulder to her right wrist to her right hip to it down her right leg.  Patient reports that her symptoms began 3 days ago while she was at school.  No thunderclap nature to headache.  Patient denies any new visual disturbance, weakness or numbness on any part of the body patient reports that the pain on her right side of her body makes her feel weak, and she is having pain with any movement of that side of the body.  Patient denies any fevers, chills, neck stiffness or pain, rashes.  Patient reports she has been nauseous with vomiting over the last couple days, consistent with her prior migraines.  Patient reports that she has not been on any treatment for lupus or any other condition or any other immunocompromising medications.  No personal history of hypercoagulability.  Pt denies history of known aneurysm.  Past Medical History:  Diagnosis Date  . Chlamydia   . Gonorrhea   . Herpes genitalia   . Lupus ([place])   . Rheumatoid arthritis(714.0)     There are no active problems to display for this patient.   Past Surgical History:  Procedure Laterality Date  . ADENOIDECTOMY    . CHOLECYSTECTOMY    . TONSILLECTOMY    . TUBAL LIGATION       OB History   None      Home Medications    Prior to Admission medications     Medication Sig Start Date End Date Taking? Authorizing Provider  HYDROcodone-acetaminophen (NORCO/VICODIN) 5-325 MG tablet Take 1 tablet by mouth every 6 (six) hours as needed (for rib pain). 01/22/17   Molpus, John, MD  metroNIDAZOLE (FLAGYL) 500 MG tablet Take 1 tablet (500 mg total) by mouth 2 (two) times daily. One po bid x 7 days 06/14/16   Street, [place], PA-C  metroNIDAZOLE (FLAGYL) 500 MG tablet Take 1 tablet (500 mg total) by mouth 2 (two) times daily. One po bid x 7 days 01/22/17   Molpus, John, MD  omeprazole (PRILOSEC OTC) 20 MG tablet Take 1 tablet (20 mg total) by mouth daily. 01/22/17   Molpus, John, MD  ondansetron (ZOFRAN ODT) 8 MG disintegrating tablet Take 1 tablet (8 mg total) by mouth every 8 (eight) hours as needed for nausea or vomiting. 06/14/16   Street, Mercedes, PA-C  ondansetron (ZOFRAN) 4 MG tablet Take 1 tablet (4 mg total) by mouth every 8 (eight) hours as needed for nausea or vomiting. 09/29/17   Tegeler, Gwenyth Allegra, MD    Family History History reviewed. No pertinent family history.  Social History Social History   Tobacco Use  . Smoking status: Current Some Day Smoker    Types: Cigarettes  . Smokeless tobacco: Never Used  Substance Use Topics  .  Alcohol use: Yes    Comment: occasional  . Drug use: No     Allergies   Cephalexin   Review of Systems Review of Systems  Constitutional: Negative for chills and fever.  HENT: Positive for sore throat. Negative for congestion and rhinorrhea.   Eyes: Positive for photophobia.  Respiratory: Negative for choking and shortness of breath.   Cardiovascular: Negative for chest pain.  Gastrointestinal: Positive for nausea and vomiting. Negative for abdominal pain.  Genitourinary: Negative for dysuria, flank pain and frequency.  Musculoskeletal: Positive for arthralgias and back pain.  Skin: Negative for rash.  Allergic/Immunologic: Negative for immunocompromised state.  Neurological: Positive for headaches.  Negative for dizziness, weakness, light-headedness and numbness.     Physical Exam Updated Vital Signs BP 115/75   Pulse (!) 57   Temp 99.2 F (37.3 C) (Oral)   Resp 20   Ht 5\' 5"  (1.651 m)   Wt 88.5 kg   LMP 12/05/2017 (Exact Date)   SpO2 99%   BMI 32.45 kg/m   Physical Exam  Constitutional: She appears well-developed and well-nourished. No distress.  HENT:  Head: Normocephalic and atraumatic.  Mouth/Throat: Oropharynx is clear and moist.  Eyes: Pupils are equal, round, and reactive to light. Conjunctivae and EOM are normal.  Neck: Normal range of motion. Neck supple.  Cardiovascular: Normal rate, regular rhythm, S1 normal and S2 normal.  No murmur heard. Pulmonary/Chest: Effort normal and breath sounds normal. She has no wheezes. She has no rales.  Abdominal: Soft. She exhibits no distension. There is no tenderness. There is no guarding.  Musculoskeletal: Normal range of motion. She exhibits no edema or deformity.  Lymphadenopathy:    She has no cervical adenopathy.  Neurological: She is alert. GCS eye subscore is 4. GCS verbal subscore is 5. GCS motor subscore is 6.  Mental Status:  Alert, oriented, thought content appropriate, able to give a coherent history. Speech fluent without evidence of aphasia. Able to follow 2 step commands without difficulty.  Cranial Nerves:  II:  Peripheral visual fields grossly normal, pupils equal, round, reactive to light III,IV, VI: ptosis not present, extra-ocular motions intact bilaterally  V,VII: smile symmetric, facial light touch sensation equal VIII: hearing grossly normal to voice  X: uvula elevates symmetrically  XI: bilateral shoulder shrug symmetric and strong XII: midline tongue extension without fassiculations Motor:  Normal tone. 5/5 in upper and lower extremities bilaterally including strong and equal grip strength and dorsiflexion/plantar flexion.   Sensory: Pinprick and light touch normal in all extremities.  Deep  Tendon Reflexes: 2+ and symmetric in the biceps and patella. No clonus. Cerebellar: normal finger-to-nose with bilateral upper extremities Gait: normal gait and balance Stance: No pronator drift and good coordination, strength, and position sense with tapping of bilateral arms (performed in sitting position).   Skin: Skin is warm and dry. No rash noted. No erythema.  Psychiatric: She has a normal mood and affect. Her behavior is normal. Judgment and thought content normal.  Nursing note and vitals reviewed.    ED Treatments / Results  Labs (all labs ordered are listed, but only abnormal results are displayed) Labs Reviewed  PREGNANCY, URINE    EKG None  Radiology No results found.  Procedures Procedures (including critical care time)  Medications Ordered in ED Medications  sodium chloride 0.9 % bolus 1,000 mL (1,000 mLs Intravenous New Bag/Given 12/05/17 1205)  acetaminophen (TYLENOL) tablet 1,000 mg (1,000 mg Oral Given 12/05/17 1202)  metoCLOPramide (REGLAN) injection 10 mg (10 mg  Intravenous Given 12/05/17 1205)  diphenhydrAMINE (BENADRYL) injection 25 mg (25 mg Intravenous Given 12/05/17 1206)  dexamethasone (DECADRON) injection 10 mg (10 mg Intravenous Given 12/05/17 1206)     Initial Impression / Assessment and Plan / ED Course  I have reviewed the triage vital signs and the nursing notes.  Pertinent labs & imaging results that were available during my care of the patient were reviewed by me and considered in my medical decision making (see chart for details).  Clinical Course as of Dec 06 1415  Fri Dec 05, 2017  1336 Patient declines any pain at this time.  Patient reports that she feels better.   [AM]    Clinical Course User Index [AM] Albesa Seen, PA-C    Patient without high-risk features of headache including: sudden onset/thunderclap HA, no similar headache in past, altered mental status, accompanying seizure, headache with exertion, age > 55, history of  immunocompromise, neck or shoulder pain, fever, use of anticoagulation, hypercoagulability, family history of spontaneous SAH, concomitant drug use other than cannabis.   Records were reviewed regarding patient's reported history of lupus.  It appears that she saw rheumatology in 2015, however she was not treated for lupus.  Patient did have a positive ANA, however arthralgias were attributed to fibromyalgia.  Patient never placed on immune suppressive medication.  Unclear results of skin biopsy.  Patient has a normal complete neurological exam, normal vital signs, normal level of consciousness, no signs of meningismus, is well-appearing/non-toxic appearing, no signs of trauma.   Patient complete resolution of her symptoms with conservative treatment with fluids, Reglan, Benadryl, Decadron, and Tylenol.  Imaging with CT/MRI not indicated given history and physical exam findings.   No dangerous or life-threatening conditions suspected or identified by history, physical exam, and by work-up. No indications for hospitalization identified.   Patient was encouraged to return to the emergency department for any worsening headache, visual disturbance, and terrible nausea or vomiting, or weakness or numbness.  Patient is understanding and agrees with plan of care.  Final Clinical Impressions(s) / ED Diagnoses   Final diagnoses:  Left-sided headache  Arthralgia, unspecified joint    ED Discharge Orders    None       Tamala Julian 12/05/17 1422    Quintella Reichert, MD 12/06/17 [phone]

## 2017-12-05 NOTE — Discharge Instructions (Addendum)
Please see the information and instructions below regarding your visit.  Your diagnoses today include:  1. Left-sided headache   2. Arthralgia, unspecified joint     You were seen and treated in the emergency department today for headache. Fortunately, your vitals, exam, and work-up is reassuring with no apparent emergent cause for your headache at this time.  Tests performed today include: See side panel of your discharge paperwork for testing performed today. Vital signs are listed at the bottom of these instructions.   Medications prescribed:    Try to avoid daily or regular use of tylenol, aspirin, ibuprofen, and other overt-the-counter pain medications as this can contribute to rebound headaches.   Take any prescribed medications only as prescribed, and any over the counter medications only as directed on the packaging.  Home care instructions:   Drink plenty of fluids at home. This will help with your headache. Be cautious with caffeine use, as this can cause your headache to rebound when the effects wear off. If you drink more than 2 cups of coffee/caffeinated tea, or caffeinated soda per day, I suggest you wean down that amount.  Please follow any educational materials contained in this packet.   Follow-up instructions: Please follow-up with your primary care provider in as soon as possible for further evaluation of your symptoms if they are not completely improved.  I listed resources in your paperwork that we will see patients in primary care on the ability to pay basis.  These are [facility] health clinics for [facility] residents.  Return instructions:  Please return to the Emergency Department if you experience worsening symptoms. It is VERY important that you monitor your symptoms at home. If you develop worsening headache, new fever, new neck stiffness, rash, focal weakness or numbness, or any other new or concerning symptoms, please return to the ED immediately, as these  may be signs that your headache has become a potentially serious and life-threatening condition.  Please return if you have any other emergent concerns.  Additional Information:   Your vital signs today were: BP 104/66 (BP Location: Right Arm)    Pulse (!) 58    Temp 99.2 F (37.3 C) (Oral)    Resp 16    Ht 5\' 5"  (1.651 m)    Wt 88.5 kg    LMP 12/05/2017 (Exact Date)    SpO2 100%    BMI 32.45 kg/m  If your blood pressure (BP) was elevated on multiple readings during this visit above 130 for the top number or above 80 for the bottom number, please have this repeated by your primary care provider within one month. --------------  Thank you for allowing [place] to participate in your care today.

## 2017-12-05 NOTE — ED Triage Notes (Signed)
Per EMS patient has right arm pain and headache x 2 days.  Reports history of lupus.  States her face was "twitching" as well.

## 2017-12-05 NOTE — ED Notes (Signed)
Offered pt water, pt stated she didn't think that was going to work. Encouraged pt to take a sip. Pt complied by taking one sip and covered her head back over.

## 2018-02-02 ENCOUNTER — Other Ambulatory Visit: Payer: Self-pay

## 2018-02-02 ENCOUNTER — Encounter (HOSPITAL_BASED_OUTPATIENT_CLINIC_OR_DEPARTMENT_OTHER): Payer: Self-pay | Admitting: Emergency Medicine

## 2018-02-02 ENCOUNTER — Emergency Department (HOSPITAL_BASED_OUTPATIENT_CLINIC_OR_DEPARTMENT_OTHER)
Admission: EM | Admit: 2018-02-02 | Discharge: 2018-02-02 | Disposition: A | Discharge status: Home / Self Care | Payer: Medicaid Other | Attending: Emergency Medicine | Admitting: Emergency Medicine

## 2018-02-02 DIAGNOSIS — B9689 Other specified bacterial agents as the cause of diseases classified elsewhere: Secondary | ICD-10-CM | POA: Insufficient documentation

## 2018-02-02 DIAGNOSIS — F17210 Nicotine dependence, cigarettes, uncomplicated: Secondary | ICD-10-CM | POA: Insufficient documentation

## 2018-02-02 DIAGNOSIS — N760 Acute vaginitis: Secondary | ICD-10-CM | POA: Diagnosis not present

## 2018-02-02 DIAGNOSIS — N898 Other specified noninflammatory disorders of vagina: Secondary | ICD-10-CM | POA: Insufficient documentation

## 2018-02-02 DIAGNOSIS — R1909 Other intra-abdominal and pelvic swelling, mass and lump: Secondary | ICD-10-CM | POA: Diagnosis present

## 2018-02-02 DIAGNOSIS — R1030 Lower abdominal pain, unspecified: Secondary | ICD-10-CM | POA: Insufficient documentation

## 2018-02-02 DIAGNOSIS — K644 Residual hemorrhoidal skin tags: Secondary | ICD-10-CM | POA: Diagnosis not present

## 2018-02-02 LAB — URINALYSIS, ROUTINE W REFLEX MICROSCOPIC
BILIRUBIN URINE: NEGATIVE
GLUCOSE, UA: NEGATIVE mg/dL
HGB URINE DIPSTICK: NEGATIVE
Ketones, ur: NEGATIVE mg/dL
Leukocytes, UA: NEGATIVE
Nitrite: NEGATIVE
PH: 8 (ref 5.0–8.0)
Protein, ur: NEGATIVE mg/dL
SPECIFIC GRAVITY, URINE: 1.015 (ref 1.005–1.030)

## 2018-02-02 LAB — WET PREP, GENITAL
Sperm: NONE SEEN
Trich, Wet Prep: NONE SEEN
Yeast Wet Prep HPF POC: NONE SEEN

## 2018-02-02 LAB — PREGNANCY, URINE: Preg Test, Ur: NEGATIVE

## 2018-02-02 MED ORDER — METRONIDAZOLE 500 MG PO TABS
500.0000 mg | ORAL_TABLET | Freq: Once | ORAL | Status: AC
  Administered 2018-02-02: 500 mg via ORAL
  Filled 2018-02-02: qty 1

## 2018-02-02 MED ORDER — ONDANSETRON 4 MG PO TBDP
4.0000 mg | ORAL_TABLET | Freq: Once | ORAL | Status: AC
  Administered 2018-02-02: 4 mg via ORAL
  Filled 2018-02-02: qty 1

## 2018-02-02 MED ORDER — HYDROCORTISONE 2.5 % RE CREA: TOPICAL_CREAM | RECTAL | 0 refills | Status: DC

## 2018-02-02 MED ORDER — METRONIDAZOLE 500 MG PO TABS: 500.0000 mg | ORAL_TABLET | Freq: Two times a day (BID) | ORAL | 0 refills | Status: DC

## 2018-02-02 MED ORDER — DOXYCYCLINE HYCLATE 100 MG PO TABS
100.0000 mg | ORAL_TABLET | Freq: Once | ORAL | Status: AC
  Administered 2018-02-02: 100 mg via ORAL
  Filled 2018-02-02: qty 1

## 2018-02-02 MED FILL — PROCTOZONE-HC 2.5 % CREA: 2.5 | 15 days supply | Qty: 30 | Fill #0

## 2018-02-02 MED FILL — metroNIDAZOLE 500 MG TABS: 500 | 7 days supply | Qty: 14 | Fill #0

## 2018-02-02 NOTE — ED Provider Notes (Signed)
[facility] EMERGENCY DEPARTMENT Provider Note   CSN: [id number] Arrival date & time: 02/02/18  1041     History   Chief Complaint Chief Complaint  Patient presents with  . Abscess    HPI Rhonda Mcgee is a 32 y.o. female hx of previous STDs, lupus, here with possible abscess. Patient states that she noticed swelling in her groin about 3 days ago. She states that it resolved then she had her menses and she noticed increased swelling and pain. Also has some vaginal discharge as well. She states that she is sexually active with one female partner but uses condoms.   The history is provided by the patient.    Past Medical History:  Diagnosis Date  . Chlamydia   . Gonorrhea   . Herpes genitalia   . Lupus ([place])   . Rheumatoid arthritis(714.0)     There are no active problems to display for this patient.   Past Surgical History:  Procedure Laterality Date  . ADENOIDECTOMY    . CHOLECYSTECTOMY    . TONSILLECTOMY    . TUBAL LIGATION       OB History   None      Home Medications    Prior to Admission medications   Medication Sig Start Date End Date Taking? Authorizing Provider  cetirizine (ZYRTEC) 10 MG tablet Take 1 tablet (10 mg total) by mouth daily. 12/05/17 01/04/18  Langston Masker B, PA-C  fluticasone (FLONASE) 50 MCG/ACT nasal spray Place 1 spray into both nostrils daily. 12/05/17   Langston Masker B, PA-C  HYDROcodone-acetaminophen (NORCO/VICODIN) 5-325 MG tablet Take 1 tablet by mouth every 6 (six) hours as needed (for rib pain). 01/22/17   Molpus, John, MD  metroNIDAZOLE (FLAGYL) 500 MG tablet Take 1 tablet (500 mg total) by mouth 2 (two) times daily. One po bid x 7 days 06/14/16   Street, [place], PA-C  metroNIDAZOLE (FLAGYL) 500 MG tablet Take 1 tablet (500 mg total) by mouth 2 (two) times daily. One po bid x 7 days 01/22/17   Molpus, John, MD  omeprazole (PRILOSEC OTC) 20 MG tablet Take 1 tablet (20 mg total) by mouth daily. 01/22/17   Molpus, John, MD    ondansetron (ZOFRAN ODT) 8 MG disintegrating tablet Take 1 tablet (8 mg total) by mouth every 8 (eight) hours as needed for nausea or vomiting. 06/14/16   Street, Mercedes, PA-C  ondansetron (ZOFRAN) 4 MG tablet Take 1 tablet (4 mg total) by mouth every 8 (eight) hours as needed for nausea or vomiting. 09/29/17   Tegeler, Gwenyth Allegra, MD    Family History No family history on file.  Social History Social History   Tobacco Use  . Smoking status: Current Some Day Smoker    Types: Cigarettes  . Smokeless tobacco: Never Used  Substance Use Topics  . Alcohol use: Yes    Comment: occasional  . Drug use: No     Allergies   Cephalexin   Review of Systems Review of Systems  Skin: Positive for color change.  All other systems reviewed and are negative.    Physical Exam Updated Vital Signs BP 123/81 (BP Location: Right Arm)   Pulse (!) 54   Temp 98.7 F (37.1 C) (Oral)   Resp 20   Ht 5\' 5"  (1.651 m)   Wt 86.2 kg   LMP 01/26/2018   SpO2 100%   BMI 31.62 kg/m   Physical Exam  Constitutional: She is oriented to person, place, and time. She appears  well-developed and well-nourished.  HENT:  Head: Normocephalic.  Eyes: Pupils are equal, round, and reactive to light.  Neck: Normal range of motion.  Cardiovascular: Normal rate.  Pulmonary/Chest: Effort normal.  Abdominal: Soft.  Genitourinary:  Genitourinary Comments: Rectal- small external hemorrhoid between the anus and vagina. It is easily reducible, no thromboses. No obvious bartholin's abscess. Whitish discharge from the vagina, no obvious CMT or adnexal tenderness   Musculoskeletal: Normal range of motion.  Neurological: She is alert and oriented to person, place, and time.  Skin: Skin is warm. Capillary refill takes less than 2 seconds.  Psychiatric: She has a normal mood and affect. Her behavior is normal.  Nursing note and vitals reviewed.    ED Treatments / Results  Labs (all labs ordered are listed, but  only abnormal results are displayed) Labs Reviewed  WET PREP, GENITAL - Abnormal; Notable for the following components:      Result Value   Clue Cells Wet Prep HPF POC PRESENT (*)    WBC, Wet Prep HPF POC FEW (*)    All other components within normal limits  URINALYSIS, ROUTINE W REFLEX MICROSCOPIC  PREGNANCY, URINE  GC/CHLAMYDIA PROBE AMP ([place]) NOT AT [facility]    EKG None  Radiology No results found.  Procedures Procedures (including critical care time)  Medications Ordered in ED Medications  metroNIDAZOLE (FLAGYL) tablet 500 mg (has no administration in time range)  doxycycline (VIBRA-TABS) tablet 100 mg (100 mg Oral Given 02/02/18 1214)     Initial Impression / Assessment and Plan / ED Course  I have reviewed the triage vital signs and the nursing notes.  Pertinent labs & imaging results that were available during my care of the patient were reviewed by me and considered in my medical decision making (see chart for details).    Rhonda Mcgee is a 32 y.o. female here with rectal pain. Has small external hemorrhoid with no obvious thromboses or abscess. No obvious bartholin's abscess. She is sexually active and has discharge. Will get wet prep, GC/chlamydia, UA, Pregnancy test.   1:08 PM UA nl. UCG neg. Wet prep + clue cells. She is allergic to keflex so I held off on rocephin. Will give flagyl for BV, anusol cream for hemorrhoid.    Final Clinical Impressions(s) / ED Diagnoses   Final diagnoses:  None    ED Discharge Orders    None       Drenda Freeze, MD 02/02/18 1309

## 2018-02-02 NOTE — Discharge Instructions (Signed)
Take flagyl twice daily for a week for bacterial vaginosis.   You have hemorrhoids. Use anusol cream twice daily   See your doctor  You will be called if you have gonorrhea or chlamydia.   Eat high fiber diet   Return to ER if you have worse swelling, vaginal discharge, vomiting, fever.

## 2018-02-02 NOTE — ED Triage Notes (Signed)
Pt presents with c/o abscess to perineum area for 3 days

## 2018-02-03 LAB — GC/CHLAMYDIA PROBE AMP (~~LOC~~) NOT AT ARMC
Chlamydia: NEGATIVE
Neisseria Gonorrhea: NEGATIVE

## 2018-02-09 NOTE — ED Notes (Signed)
02/09/2018, 9:21 am  Pt. Called for STD results, Results reviewed with pt. Results negative,  All questions answered.

## 2018-03-06 ENCOUNTER — Other Ambulatory Visit: Payer: Self-pay

## 2018-03-06 ENCOUNTER — Encounter (HOSPITAL_COMMUNITY): Payer: Self-pay | Admitting: Obstetrics and Gynecology

## 2018-03-06 ENCOUNTER — Emergency Department (HOSPITAL_COMMUNITY)
Admission: EM | Admit: 2018-03-06 | Discharge: 2018-03-06 | Discharge status: Against Medical Advice | Payer: Medicaid Other | Attending: Emergency Medicine | Admitting: Emergency Medicine

## 2018-03-06 ENCOUNTER — Emergency Department (HOSPITAL_COMMUNITY): Payer: Medicaid Other

## 2018-03-06 DIAGNOSIS — R110 Nausea: Secondary | ICD-10-CM | POA: Insufficient documentation

## 2018-03-06 DIAGNOSIS — M3210 Systemic lupus erythematosus, organ or system involvement unspecified: Secondary | ICD-10-CM | POA: Diagnosis not present

## 2018-03-06 DIAGNOSIS — F17210 Nicotine dependence, cigarettes, uncomplicated: Secondary | ICD-10-CM | POA: Insufficient documentation

## 2018-03-06 DIAGNOSIS — M069 Rheumatoid arthritis, unspecified: Secondary | ICD-10-CM | POA: Insufficient documentation

## 2018-03-06 DIAGNOSIS — R51 Headache: Secondary | ICD-10-CM | POA: Insufficient documentation

## 2018-03-06 DIAGNOSIS — E860 Dehydration: Secondary | ICD-10-CM | POA: Diagnosis not present

## 2018-03-06 DIAGNOSIS — R05 Cough: Secondary | ICD-10-CM | POA: Diagnosis not present

## 2018-03-06 DIAGNOSIS — R42 Dizziness and giddiness: Secondary | ICD-10-CM | POA: Diagnosis present

## 2018-03-06 DIAGNOSIS — H53149 Visual discomfort, unspecified: Secondary | ICD-10-CM | POA: Insufficient documentation

## 2018-03-06 DIAGNOSIS — Z5320 Procedure and treatment not carried out because of patient's decision for unspecified reasons: Secondary | ICD-10-CM | POA: Insufficient documentation

## 2018-03-06 LAB — BASIC METABOLIC PANEL
Anion gap: 7 (ref 5–15)
BUN: 11 mg/dL (ref 6–20)
CALCIUM: 8.7 mg/dL — AB (ref 8.9–10.3)
CHLORIDE: 106 mmol/L (ref 98–111)
CO2: 25 mmol/L (ref 22–32)
CREATININE: 0.82 mg/dL (ref 0.44–1.00)
GFR calc non Af Amer: >60 mL/min (ref >60)
Glucose, Bld: 80 mg/dL (ref 70–99)
Potassium: 4.4 mmol/L (ref 3.5–5.1)
SODIUM: 138 mmol/L (ref 135–145)

## 2018-03-06 LAB — CBC
HEMATOCRIT: 40.3 % (ref 36.0–46.0)
Hemoglobin: 12.4 g/dL (ref 12.0–15.0)
MCH: 26.3 pg (ref 26.0–34.0)
MCHC: 30.8 g/dL (ref 30.0–36.0)
MCV: 85.4 fL (ref 80.0–100.0)
NRBC: 0 % (ref 0.0–0.2)
PLATELETS: 265 10*3/uL (ref 150–400)
RBC: 4.72 MIL/uL (ref 3.87–5.11)
RDW: 15.4 % (ref 11.5–15.5)
WBC: 4.8 10*3/uL (ref 4.0–10.5)

## 2018-03-06 LAB — URINALYSIS, ROUTINE W REFLEX MICROSCOPIC
Bilirubin Urine: NEGATIVE
GLUCOSE, UA: NEGATIVE mg/dL
HGB URINE DIPSTICK: NEGATIVE
KETONES UR: NEGATIVE mg/dL
LEUKOCYTES UA: NEGATIVE
Nitrite: NEGATIVE
PH: 7 (ref 5.0–8.0)
Protein, ur: NEGATIVE mg/dL
Specific Gravity, Urine: 1.017 (ref 1.005–1.030)

## 2018-03-06 LAB — I-STAT BETA HCG BLOOD, ED (MC, WL, AP ONLY): I-stat hCG, quantitative: <5 m[IU]/mL (ref <5)

## 2018-03-06 MED ORDER — SODIUM CHLORIDE 0.9 % IV BOLUS
2000.0000 mL | Freq: Once | INTRAVENOUS | Status: AC
  Administered 2018-03-06: 2000 mL via INTRAVENOUS

## 2018-03-06 MED ORDER — PROCHLORPERAZINE EDISYLATE 10 MG/2ML IJ SOLN
10.0000 mg | Freq: Once | INTRAMUSCULAR | Status: AC
  Administered 2018-03-06: 10 mg via INTRAVENOUS
  Filled 2018-03-06: qty 2

## 2018-03-06 NOTE — ED Triage Notes (Signed)
Pt reports dizziness and weakness since Monday. Pt reports she has been trying to drink water but she gets nauseous and is having difficulty standing. Pt reports she has lupus and she is supposed to go to a different specialist soon.  Pt reports she feels so sick and weak that she cannot function. Pt reports a severe headache since last Friday.

## 2018-03-06 NOTE — ED Provider Notes (Signed)
[facility] DEPT Provider Note   CSN: [id number] Arrival date & time: 03/06/18  1219     History   Chief Complaint Chief Complaint  Patient presents with  . Dizziness    HPI Rhonda Mcgee is a 32 y.o. female.  HPI Patient complains of dizziness meaning lightheadedness with standing for the past 3 days associated symptoms include headache, left-sided sharp in nature with nausea and photophobia.  She is also had a cough productive of yellowish sputum for the past 2 weeks.  She denies any fever.  Denies shortness of breath.  She is treated herself with Aleve with partial relief of her headache.  She reports similar headaches approximately once per month since age 5.  Headache worse with looking at light.  Not improved by anything.  Constant. Past Medical History:  Diagnosis Date  . Chlamydia   . Gonorrhea   . Herpes genitalia   . Lupus ([place])   . Rheumatoid arthritis(714.0)     There are no active problems to display for this patient.   Past Surgical History:  Procedure Laterality Date  . ADENOIDECTOMY    . CHOLECYSTECTOMY    . TONSILLECTOMY    . TUBAL LIGATION       OB History    Gravida      Para      Term      Preterm      AB      Living  2     SAB      TAB      Ectopic      Multiple      Live Births               Home Medications    Prior to Admission medications   Medication Sig Start Date End Date Taking? Authorizing Provider  ibuprofen (ADVIL,MOTRIN) 800 MG tablet Take 800 mg by mouth every 8 (eight) hours as needed for headache.   Yes [provider]  naproxen sodium (ALEVE) 220 MG tablet Take 220 mg by mouth 2 (two) times daily as needed (headache/mild pain).   Yes [provider]  cetirizine (ZYRTEC) 10 MG tablet Take 1 tablet (10 mg total) by mouth daily. Patient not taking: Reported on 03/06/2018 12/05/17 01/04/18  Langston Masker B, PA-C  fluticasone (FLONASE) 50 MCG/ACT nasal spray  Place 1 spray into both nostrils daily. Patient not taking: Reported on 03/06/2018 12/05/17   Albesa Seen, PA-C  HYDROcodone-acetaminophen (NORCO/VICODIN) 5-325 MG tablet Take 1 tablet by mouth every 6 (six) hours as needed (for rib pain). Patient not taking: Reported on 03/06/2018 01/22/17   Molpus, Jenny Reichmann, MD  hydrocortisone (ANUSOL-HC) 2.5 % rectal cream Apply rectally 2 times daily Patient not taking: Reported on 03/06/2018 02/02/18   Drenda Freeze, MD  metroNIDAZOLE (FLAGYL) 500 MG tablet Take 1 tablet (500 mg total) by mouth 2 (two) times daily. One po bid x 7 days Patient not taking: Reported on 03/06/2018 02/02/18   Drenda Freeze, MD  omeprazole (PRILOSEC OTC) 20 MG tablet Take 1 tablet (20 mg total) by mouth daily. Patient not taking: Reported on 03/06/2018 01/22/17   Molpus, Jenny Reichmann, MD  ondansetron (ZOFRAN ODT) 8 MG disintegrating tablet Take 1 tablet (8 mg total) by mouth every 8 (eight) hours as needed for nausea or vomiting. Patient not taking: Reported on 03/06/2018 06/14/16   Street, Jewett, PA-C  ondansetron (ZOFRAN) 4 MG tablet Take 1 tablet (4 mg total) by mouth every 8 (  eight) hours as needed for nausea or vomiting. Patient not taking: Reported on 03/06/2018 09/29/17   Tegeler, Gwenyth Allegra, MD    Family History No family history on file.  Social History Social History   Tobacco Use  . Smoking status: Current Some Day Smoker    Types: Cigarettes  . Smokeless tobacco: Never Used  Substance Use Topics  . Alcohol use: Yes    Comment: occasional  . Drug use: No     Allergies   Cephalexin   Review of Systems Review of Systems  Constitutional: Negative.   HENT: Negative.   Eyes: Positive for photophobia.  Respiratory: Positive for cough.   Cardiovascular: Negative.   Gastrointestinal: Positive for nausea.  Musculoskeletal: Negative.   Skin: Negative.   Neurological: Positive for light-headedness and headaches.  Psychiatric/Behavioral: Negative.   All other  systems reviewed and are negative.    Physical Exam Updated Vital Signs BP 121/80 (BP Location: Left Arm)   Pulse 77   Temp 98.5 F (36.9 C) (Oral)   Resp 18   Ht 5\' 5"  (1.651 m)   Wt 88.5 kg   LMP 02/22/2017   SpO2 100%   BMI 32.45 kg/m   Physical Exam  Constitutional: She is oriented to person, place, and time. She appears well-developed and well-nourished.  HENT:  Head: Normocephalic and atraumatic.  Eyes: Pupils are equal, round, and reactive to light. Conjunctivae are normal.  Neck: Neck supple. No tracheal deviation present. No thyromegaly present.  Cardiovascular: Normal rate and regular rhythm.  No murmur heard. Pulmonary/Chest: Effort normal and breath sounds normal.  Abdominal: Soft. Bowel sounds are normal. She exhibits no distension. There is no tenderness.  Musculoskeletal: Normal range of motion. She exhibits no edema or tenderness.  Neurological: She is alert and oriented to person, place, and time. Coordination normal.  Gait normal Romberg normal pronator drift normal finger-to-nose normal DTR symmetric bilaterally at knee jerk ankle jerk and biceps toes downward going bilaterally  Skin: Skin is warm and dry. No rash noted.  Psychiatric: She has a normal mood and affect.  Nursing note and vitals reviewed.    ED Treatments / Results  Labs (all labs ordered are listed, but only abnormal results are displayed) Labs Reviewed  BASIC METABOLIC PANEL - Abnormal; Notable for the following components:      Result Value   Calcium 8.7 (*)    All other components within normal limits  CBC  URINALYSIS, ROUTINE W REFLEX MICROSCOPIC  CBG MONITORING, ED  I-STAT BETA HCG BLOOD, ED ([facility], [facility], [facility] ONLY)    EKG EKG Interpretation  Date/Time:  Friday March 06 2018 13:36:59 EST Ventricular Rate:  64 PR Interval:    QRS Duration: 72 QT Interval:  394 QTC Calculation: 407 R Axis:   17 Text Interpretation:  Sinus rhythm No significant change since last tracing  Confirmed by Orlie Dakin [phone]) on 03/06/2018 3:00:16 PM   Radiology Dg Chest 2 View  Result Date: 03/06/2018 CLINICAL DATA:  Cough.  Dizziness and weakness. EXAM: CHEST - 2 VIEW COMPARISON:  09/29/2017 and 08/05/2017 FINDINGS: The heart size and mediastinal contours are within normal limits. Both lungs are clear. The visualized skeletal structures are unremarkable. IMPRESSION: Normal exam. Electronically Signed   By: Lorriane Shire M.D.   On: 03/06/2018 15:05   Chest x-ray viewed by me Procedures Procedures (including critical care time)  Medications Ordered in ED Medications  sodium chloride 0.9 % bolus 2,000 mL (2,000 mLs Intravenous New Bag/Given 03/06/18 1515)  prochlorperazine (COMPAZINE) injection 10 mg (10 mg Intravenous Given 03/06/18 1518)    Results for orders placed or performed during the hospital encounter of 29/92/42  Basic metabolic panel  Result Value Ref Range   Sodium 138 135 - 145 mmol/L   Potassium 4.4 3.5 - 5.1 mmol/L   Chloride 106 98 - 111 mmol/L   CO2 25 22 - 32 mmol/L   Glucose, Bld 80 70 - 99 mg/dL   BUN 11 6 - 20 mg/dL   Creatinine, Ser 0.82 0.44 - 1.00 mg/dL   Calcium 8.7 (L) 8.9 - 10.3 mg/dL   GFR calc non Af Amer >60 >60 mL/min   GFR calc Af Amer >60 >60 mL/min   Anion gap 7 5 - 15  CBC  Result Value Ref Range   WBC 4.8 4.0 - 10.5 K/uL   RBC 4.72 3.87 - 5.11 MIL/uL   Hemoglobin 12.4 12.0 - 15.0 g/dL   HCT 40.3 36.0 - 46.0 %   MCV 85.4 80.0 - 100.0 fL   MCH 26.3 26.0 - 34.0 pg   MCHC 30.8 30.0 - 36.0 g/dL   RDW 15.4 11.5 - 15.5 %   Platelets 265 150 - 400 K/uL   nRBC 0.0 0.0 - 0.2 %  Urinalysis, Routine w reflex microscopic  Result Value Ref Range   Color, Urine YELLOW YELLOW   APPearance CLEAR CLEAR   Specific Gravity, Urine 1.017 1.005 - 1.030   pH 7.0 5.0 - 8.0   Glucose, UA NEGATIVE NEGATIVE mg/dL   Hgb urine dipstick NEGATIVE NEGATIVE   Bilirubin Urine NEGATIVE NEGATIVE   Ketones, ur NEGATIVE NEGATIVE mg/dL   Protein, ur  NEGATIVE NEGATIVE mg/dL   Nitrite NEGATIVE NEGATIVE   Leukocytes, UA NEGATIVE NEGATIVE  I-Stat beta hCG blood, ED  Result Value Ref Range   I-stat hCG, quantitative <5.0 <5 mIU/mL   Comment 3           Dg Chest 2 View  Result Date: 03/06/2018 CLINICAL DATA:  Cough.  Dizziness and weakness. EXAM: CHEST - 2 VIEW COMPARISON:  09/29/2017 and 08/05/2017 FINDINGS: The heart size and mediastinal contours are within normal limits. Both lungs are clear. The visualized skeletal structures are unremarkable. IMPRESSION: Normal exam. Electronically Signed   By: Lorriane Shire M.D.   On: 03/06/2018 15:05  Chest x-ray viewed by me Initial Impression / Assessment and Plan / ED Course  I have reviewed the triage vital signs and the nursing notes.  Pertinent labs & imaging results that were available during my care of the patient were reviewed by me and considered in my medical decision making (see chart for details).     Lab work unremarkable.  Patient clinically is mildly dehydrated.  She reports increased thirst and lightheadedness with standing.  Headaches of migrainous quality.  She reports similar headaches with menses since age 9.  She gets an aura of flashes of light prior to headaches starting.  She did not wait for treatment with intravenous hydration and did not wait for reassessment.  She eloped from the emergency department before I was notified. I did counsel patient for 5 minutes on smoking cessation. Final Clinical Impressions(s) / ED Diagnoses  Diagnoses #1 migraine headache #2 dehydration #3 acute bronchitis #4 tobacco abuse Final diagnoses:  None    ED Discharge Orders    None       Orlie Dakin, MD 03/06/18 1614

## 2018-03-06 NOTE — ED Notes (Addendum)
Patient left the ED AMA. Patient was advised not to leave, patient stated they had to pick up their child from school. Patient was stable and ambulated out of ED.

## 2018-03-16 DIAGNOSIS — H01129 Discoid lupus erythematosus of unspecified eye, unspecified eyelid: Secondary | ICD-10-CM | POA: Insufficient documentation

## 2018-05-06 ENCOUNTER — Emergency Department (HOSPITAL_COMMUNITY)
Admission: EM | Admit: 2018-05-06 | Discharge: 2018-05-06 | Disposition: A | Discharge status: Home / Self Care | Payer: Medicaid Other | Attending: Emergency Medicine | Admitting: Emergency Medicine

## 2018-05-06 ENCOUNTER — Emergency Department (HOSPITAL_COMMUNITY): Payer: Medicaid Other

## 2018-05-06 ENCOUNTER — Encounter (HOSPITAL_COMMUNITY): Payer: Self-pay | Admitting: Emergency Medicine

## 2018-05-06 DIAGNOSIS — J029 Acute pharyngitis, unspecified: Secondary | ICD-10-CM | POA: Diagnosis present

## 2018-05-06 DIAGNOSIS — Z79899 Other long term (current) drug therapy: Secondary | ICD-10-CM | POA: Diagnosis not present

## 2018-05-06 DIAGNOSIS — J069 Acute upper respiratory infection, unspecified: Secondary | ICD-10-CM | POA: Insufficient documentation

## 2018-05-06 DIAGNOSIS — F17210 Nicotine dependence, cigarettes, uncomplicated: Secondary | ICD-10-CM | POA: Diagnosis not present

## 2018-05-06 DIAGNOSIS — J988 Other specified respiratory disorders: Secondary | ICD-10-CM

## 2018-05-06 LAB — GROUP A STREP BY PCR: Group A Strep by PCR: NOT DETECTED

## 2018-05-06 MED ORDER — DOXYCYCLINE HYCLATE 100 MG PO CAPS: 100.0000 mg | ORAL_CAPSULE | Freq: Two times a day (BID) | ORAL | 0 refills | Status: DC

## 2018-05-06 NOTE — ED Notes (Signed)
Pt verbalized understanding of dc instructions, vss, ambulatory with nad upon discharge

## 2018-05-06 NOTE — ED Provider Notes (Signed)
[facility] EMERGENCY DEPARTMENT Provider Note   CSN: [id number] Arrival date & time: 05/06/18  0522     History   Chief Complaint Chief Complaint  Patient presents with  . Sore Throat  . flu like symptoms    HPI Rhonda Mcgee is a 33 y.o. female.  HPI   33 year old female presents today with complaints of respiratory infection.  Patient notes symptoms started approximately 2 weeks ago with nasal congestion rhinorrhea and cough.  She notes low-grade fevers at home.  Patient denies any significant shortness of breath, reports she does smoke.  She denies any abdominal pain nausea vomiting or diarrhea.  Reports taking Mucinex at home without significant provement in her symptoms.  She reports a history of lupus currently on Plaquenil.  Past Medical History:  Diagnosis Date  . Chlamydia   . Gonorrhea   . Herpes genitalia   . Lupus ([place])   . Rheumatoid arthritis(714.0)     There are no active problems to display for this patient.   Past Surgical History:  Procedure Laterality Date  . ADENOIDECTOMY    . CHOLECYSTECTOMY    . TONSILLECTOMY    . TUBAL LIGATION       OB History    Gravida      Para      Term      Preterm      AB      Living  2     SAB      TAB      Ectopic      Multiple      Live Births               Home Medications    Prior to Admission medications   Medication Sig Start Date End Date Taking? Authorizing Provider  cetirizine (ZYRTEC) 10 MG tablet Take 1 tablet (10 mg total) by mouth daily. Patient not taking: Reported on 03/06/2018 12/05/17 01/04/18  Langston Masker B, PA-C  doxycycline (VIBRAMYCIN) 100 MG capsule Take 1 capsule (100 mg total) by mouth 2 (two) times daily. 05/06/18   Biridiana Twardowski, Dellis Filbert, PA-C  fluticasone (FLONASE) 50 MCG/ACT nasal spray Place 1 spray into both nostrils daily. Patient not taking: Reported on 03/06/2018 12/05/17   Albesa Seen, PA-C  HYDROcodone-acetaminophen (NORCO/VICODIN) 5-325 MG  tablet Take 1 tablet by mouth every 6 (six) hours as needed (for rib pain). Patient not taking: Reported on 03/06/2018 01/22/17   Molpus, Jenny Reichmann, MD  hydrocortisone (ANUSOL-HC) 2.5 % rectal cream Apply rectally 2 times daily Patient not taking: Reported on 03/06/2018 02/02/18   Drenda Freeze, MD  ibuprofen (ADVIL,MOTRIN) 800 MG tablet Take 800 mg by mouth every 8 (eight) hours as needed for headache.    [provider]  metroNIDAZOLE (FLAGYL) 500 MG tablet Take 1 tablet (500 mg total) by mouth 2 (two) times daily. One po bid x 7 days Patient not taking: Reported on 03/06/2018 02/02/18   Drenda Freeze, MD  naproxen sodium (ALEVE) 220 MG tablet Take 220 mg by mouth 2 (two) times daily as needed (headache/mild pain).    [provider]  omeprazole (PRILOSEC OTC) 20 MG tablet Take 1 tablet (20 mg total) by mouth daily. Patient not taking: Reported on 03/06/2018 01/22/17   Molpus, Jenny Reichmann, MD  ondansetron (ZOFRAN ODT) 8 MG disintegrating tablet Take 1 tablet (8 mg total) by mouth every 8 (eight) hours as needed for nausea or vomiting. Patient not taking: Reported on 03/06/2018 06/14/16  Street, Mercedes, PA-C  ondansetron (ZOFRAN) 4 MG tablet Take 1 tablet (4 mg total) by mouth every 8 (eight) hours as needed for nausea or vomiting. Patient not taking: Reported on 03/06/2018 09/29/17   Tegeler, Gwenyth Allegra, MD    Family History No family history on file.  Social History Social History   Tobacco Use  . Smoking status: Current Some Day Smoker    Types: Cigarettes  . Smokeless tobacco: Never Used  Substance Use Topics  . Alcohol use: Yes    Comment: occasional  . Drug use: No     Allergies   Cephalexin  Review of Systems Review of Systems  All other systems reviewed and are negative.   Physical Exam Updated Vital Signs BP 124/87 (BP Location: Right Arm)   Pulse 96   Temp 98.8 F (37.1 C) (Oral)   Resp 16   Ht 5\' 5"  (1.651 m)   Wt 88.5 kg   SpO2 100%   BMI 32.45  kg/m   Physical Exam Vitals signs and nursing note reviewed.  Constitutional:      Appearance: She is well-developed.  HENT:     Head: Normocephalic and atraumatic.     Comments: Oropharynx clear with no erythema, exudate or tonsillar swelling-rhinorrhea noted Eyes:     General: No scleral icterus.       Right eye: No discharge.        Left eye: No discharge.     Conjunctiva/sclera: Conjunctivae normal.     Pupils: Pupils are equal, round, and reactive to light.  Neck:     Musculoskeletal: Normal range of motion.     Vascular: No JVD.     Trachea: No tracheal deviation.  Pulmonary:     Effort: Pulmonary effort is normal. No respiratory distress.     Breath sounds: No stridor. No wheezing, rhonchi or rales.  Chest:     Chest wall: No tenderness.  Neurological:     Mental Status: She is alert and oriented to person, place, and time.     Coordination: Coordination normal.  Psychiatric:        Behavior: Behavior normal.        Thought Content: Thought content normal.        Judgment: Judgment normal.    ED Treatments / Results  Labs (all labs ordered are listed, but only abnormal results are displayed) Labs Reviewed  GROUP A STREP BY PCR    EKG None  Radiology Dg Chest 2 View  Result Date: 05/06/2018 CLINICAL DATA:  Cough and fever. Lupus and rheumatoid arthritis. Smoker. EXAM: CHEST - 2 VIEW COMPARISON:  03/06/2018 FINDINGS: The heart size and mediastinal contours are within normal limits. Both lungs are clear. The visualized skeletal structures are unremarkable. IMPRESSION: Negative.  No active cardiopulmonary disease. Electronically Signed   By: Earle Gell M.D.   On: 05/06/2018 08:51    Procedures Procedures (including critical care time)  Medications Ordered in ED Medications - No data to display   Initial Impression / Assessment and Plan / ED Course  I have reviewed the triage vital signs and the nursing notes.  Pertinent labs & imaging results that were  available during my care of the patient were reviewed by me and considered in my medical decision making (see chart for details).    Labs:   Imaging:  Consults:  Therapeutics:  Discharge Meds:   Assessment/Plan: 33 year old female presents today with 2 weeks of respiratory infection.  Due to continued symptoms I  do find it reasonable to initiate antibiotics.  Low suspicion for influenza, negative group A strep.  Patient will follow-up immediately if develops any new or worsening signs or symptoms.  She verbalized understanding and agreement to today's plan had no further questions or concerns.    Final Clinical Impressions(s) / ED Diagnoses   Final diagnoses:  Respiratory infection    ED Discharge Orders         Ordered    doxycycline (VIBRAMYCIN) 100 MG capsule  2 times daily     05/06/18 0922           Okey Regal, PA-C 05/06/18 7639    Charlesetta Shanks, MD 05/06/18 1124

## 2018-05-06 NOTE — Discharge Instructions (Addendum)
Please read attached information. If you experience any new or worsening signs or symptoms please return to the emergency room for evaluation. Please follow-up with your primary care provider or specialist as discussed. Please use medication prescribed only as directed and discontinue taking if you have any concerning signs or symptoms.   °

## 2018-05-06 NOTE — ED Triage Notes (Signed)
Pt reports "flu like symptoms" including sore throat, productive cough w/ green mucus, chills/sweats, body aches.  Despite using OTC medications pt has been feeling poorly for two weeks.   Pt also has lupus.

## 2018-06-05 ENCOUNTER — Encounter (HOSPITAL_COMMUNITY): Payer: Self-pay | Admitting: Emergency Medicine

## 2018-06-05 ENCOUNTER — Emergency Department (HOSPITAL_COMMUNITY)
Admission: EM | Admit: 2018-06-05 | Discharge: 2018-06-05 | Disposition: A | Discharge status: Home / Self Care | Payer: Medicaid Other | Attending: Emergency Medicine | Admitting: Emergency Medicine

## 2018-06-05 ENCOUNTER — Other Ambulatory Visit: Payer: Self-pay

## 2018-06-05 DIAGNOSIS — R51 Headache: Secondary | ICD-10-CM | POA: Insufficient documentation

## 2018-06-05 DIAGNOSIS — R102 Pelvic and perineal pain: Secondary | ICD-10-CM

## 2018-06-05 DIAGNOSIS — F17210 Nicotine dependence, cigarettes, uncomplicated: Secondary | ICD-10-CM | POA: Insufficient documentation

## 2018-06-05 DIAGNOSIS — H01002 Unspecified blepharitis right lower eyelid: Secondary | ICD-10-CM | POA: Insufficient documentation

## 2018-06-05 DIAGNOSIS — Z79899 Other long term (current) drug therapy: Secondary | ICD-10-CM | POA: Diagnosis not present

## 2018-06-05 DIAGNOSIS — N898 Other specified noninflammatory disorders of vagina: Secondary | ICD-10-CM | POA: Insufficient documentation

## 2018-06-05 DIAGNOSIS — R519 Headache, unspecified: Secondary | ICD-10-CM

## 2018-06-05 DIAGNOSIS — H5789 Other specified disorders of eye and adnexa: Secondary | ICD-10-CM | POA: Diagnosis present

## 2018-06-05 LAB — CBC
HCT: 37.5 % (ref 36.0–46.0)
Hemoglobin: 11.6 g/dL — ABNORMAL LOW (ref 12.0–15.0)
MCH: 26 pg (ref 26.0–34.0)
MCHC: 30.9 g/dL (ref 30.0–36.0)
MCV: 83.9 fL (ref 80.0–100.0)
NRBC: 0 % (ref 0.0–0.2)
PLATELETS: 251 10*3/uL (ref 150–400)
RBC: 4.47 MIL/uL (ref 3.87–5.11)
RDW: 15 % (ref 11.5–15.5)
WBC: 5.4 10*3/uL (ref 4.0–10.5)

## 2018-06-05 LAB — URINALYSIS, ROUTINE W REFLEX MICROSCOPIC
Bilirubin Urine: NEGATIVE
Glucose, UA: NEGATIVE mg/dL
Hgb urine dipstick: NEGATIVE
Ketones, ur: NEGATIVE mg/dL
LEUKOCYTES UA: NEGATIVE
NITRITE: NEGATIVE
PROTEIN: NEGATIVE mg/dL
Specific Gravity, Urine: 1.011 (ref 1.005–1.030)
pH: 6 (ref 5.0–8.0)

## 2018-06-05 LAB — LIPASE, BLOOD: LIPASE: 35 U/L (ref 11–51)

## 2018-06-05 LAB — COMPREHENSIVE METABOLIC PANEL
ALBUMIN: 3.7 g/dL (ref 3.5–5.0)
ALT: 16 U/L (ref 0–44)
ANION GAP: 12 (ref 5–15)
AST: 17 U/L (ref 15–41)
Alkaline Phosphatase: 62 U/L (ref 38–126)
BUN: 10 mg/dL (ref 6–20)
CALCIUM: 8.7 mg/dL — AB (ref 8.9–10.3)
CHLORIDE: 108 mmol/L (ref 98–111)
CO2: 18 mmol/L — ABNORMAL LOW (ref 22–32)
Creatinine, Ser: 0.96 mg/dL (ref 0.44–1.00)
GFR calc Af Amer: >60 mL/min (ref >60)
GFR calc non Af Amer: >60 mL/min (ref >60)
GLUCOSE: 91 mg/dL (ref 70–99)
POTASSIUM: 4 mmol/L (ref 3.5–5.1)
Sodium: 138 mmol/L (ref 135–145)
TOTAL PROTEIN: 7.2 g/dL (ref 6.5–8.1)
Total Bilirubin: 0.6 mg/dL (ref 0.3–1.2)

## 2018-06-05 LAB — I-STAT BETA HCG BLOOD, ED (MC, WL, AP ONLY)

## 2018-06-05 LAB — WET PREP, GENITAL
Sperm: NONE SEEN
TRICH WET PREP: NONE SEEN
Yeast Wet Prep HPF POC: NONE SEEN

## 2018-06-05 MED ORDER — SODIUM CHLORIDE 0.9 % IV BOLUS
1000.0000 mL | Freq: Once | INTRAVENOUS | Status: AC
  Administered 2018-06-05: 1000 mL via INTRAVENOUS

## 2018-06-05 MED ORDER — ERYTHROMYCIN 5 MG/GM OP OINT: TOPICAL_OINTMENT | OPHTHALMIC | 0 refills | Status: DC

## 2018-06-05 MED ORDER — AZITHROMYCIN 250 MG PO TABS
2000.0000 mg | ORAL_TABLET | Freq: Once | ORAL | Status: AC
  Administered 2018-06-05: 2000 mg via ORAL
  Filled 2018-06-05: qty 8

## 2018-06-05 MED ORDER — METOCLOPRAMIDE HCL 5 MG/ML IJ SOLN
10.0000 mg | Freq: Once | INTRAMUSCULAR | Status: AC
  Administered 2018-06-05: 10 mg via INTRAVENOUS
  Filled 2018-06-05: qty 2

## 2018-06-05 MED ORDER — ERYTHROMYCIN 5 MG/GM OP OINT
1.0000 "application " | TOPICAL_OINTMENT | Freq: Once | OPHTHALMIC | Status: AC
  Administered 2018-06-05: 1 via OPHTHALMIC
  Filled 2018-06-05: qty 3.5

## 2018-06-05 MED ORDER — METRONIDAZOLE 500 MG PO TABS: 500.0000 mg | ORAL_TABLET | Freq: Two times a day (BID) | ORAL | 0 refills | Status: DC

## 2018-06-05 MED ORDER — DIPHENHYDRAMINE HCL 50 MG/ML IJ SOLN
25.0000 mg | Freq: Once | INTRAMUSCULAR | Status: AC
  Administered 2018-06-05: 25 mg via INTRAVENOUS
  Filled 2018-06-05: qty 1

## 2018-06-05 MED ORDER — KETOROLAC TROMETHAMINE 30 MG/ML IJ SOLN
30.0000 mg | Freq: Once | INTRAMUSCULAR | Status: AC
  Administered 2018-06-05: 30 mg via INTRAVENOUS
  Filled 2018-06-05: qty 1

## 2018-06-05 NOTE — ED Notes (Signed)
Urine culture sent to lab if needed

## 2018-06-05 NOTE — ED Notes (Signed)
Patient verbalizes understanding of discharge instructions. Opportunity for questioning and answers were provided. Armband removed by staff, pt discharged from ED ambulatory.   

## 2018-06-05 NOTE — ED Provider Notes (Signed)
[facility] EMERGENCY DEPARTMENT Provider Note   CSN: [id number] Arrival date & time: 06/05/18  0153     History   Chief Complaint Chief Complaint  Patient presents with  . Eye Pain  . Emesis    HPI Rhonda Mcgee is a 33 y.o. female.  Patient presents to the emergency department with a chief complaint of multiple complaints.  1. Eye pain: she states that when she awoke today she had swelling of her right lower eyelid.  The eyelid is red and tender to touch and has been discharging some yellow material.  She denies any vision changes.  Denies any fevers chills.  Denies any treatments prior to arrival. 2.  Headache: Patient reports associated headache with her eye lid pain.  She states that it causes her entire head to throb.  Denies any vision changes, diplopia, flashers, or floaters.  Has not taken anything for her symptoms. 3.  Suprapubic abdominal pain: Patient reports having had vaginal discharge and suprapubic pain.  She states that she commonly gets bacterial vaginosis, and believes that she has this again.  She denies any treatments prior to arrival.   The history is provided by the patient. No language interpreter was used.    Past Medical History:  Diagnosis Date  . Chlamydia   . Gonorrhea   . Herpes genitalia   . Lupus ([place])   . Rheumatoid arthritis(714.0)     There are no active problems to display for this patient.   Past Surgical History:  Procedure Laterality Date  . ADENOIDECTOMY    . CHOLECYSTECTOMY    . TONSILLECTOMY    . TUBAL LIGATION       OB History    Gravida      Para      Term      Preterm      AB      Living  2     SAB      TAB      Ectopic      Multiple      Live Births               Home Medications    Prior to Admission medications   Medication Sig Start Date End Date Taking? Authorizing Provider  hydroxychloroquine (PLAQUENIL) 200 MG tablet Take 400 mg by mouth daily. 03/21/18  Yes  [provider]  cetirizine (ZYRTEC) 10 MG tablet Take 1 tablet (10 mg total) by mouth daily. Patient not taking: Reported on 06/05/2018 12/05/17 06/05/26  Langston Masker B, PA-C  doxycycline (VIBRAMYCIN) 100 MG capsule Take 1 capsule (100 mg total) by mouth 2 (two) times daily. Patient not taking: Reported on 06/05/2018 05/06/18   Hedges, Dellis Filbert, PA-C  fluticasone [facility]) 50 MCG/ACT nasal spray Place 1 spray into both nostrils daily. Patient not taking: Reported on 03/06/2018 12/05/17   Albesa Seen, PA-C  HYDROcodone-acetaminophen (NORCO/VICODIN) 5-325 MG tablet Take 1 tablet by mouth every 6 (six) hours as needed (for rib pain). Patient not taking: Reported on 03/06/2018 01/22/17   Molpus, Jenny Reichmann, MD  hydrocortisone (ANUSOL-HC) 2.5 % rectal cream Apply rectally 2 times daily Patient not taking: Reported on 03/06/2018 02/02/18   Drenda Freeze, MD  metroNIDAZOLE (FLAGYL) 500 MG tablet Take 1 tablet (500 mg total) by mouth 2 (two) times daily. One po bid x 7 days Patient not taking: Reported on 03/06/2018 02/02/18   Drenda Freeze, MD  omeprazole (PRILOSEC OTC) 20 MG tablet Take 1 tablet (  20 mg total) by mouth daily. Patient not taking: Reported on 03/06/2018 01/22/17   Molpus, Jenny Reichmann, MD  ondansetron (ZOFRAN ODT) 8 MG disintegrating tablet Take 1 tablet (8 mg total) by mouth every 8 (eight) hours as needed for nausea or vomiting. Patient not taking: Reported on 03/06/2018 06/14/16   Street, [place], PA-C  ondansetron (ZOFRAN) 4 MG tablet Take 1 tablet (4 mg total) by mouth every 8 (eight) hours as needed for nausea or vomiting. Patient not taking: Reported on 03/06/2018 09/29/17   Tegeler, Gwenyth Allegra, MD    Family History History reviewed. No pertinent family history.  Social History Social History   Tobacco Use  . Smoking status: Current Some Day Smoker    Types: Cigarettes  . Smokeless tobacco: Never Used  Substance Use Topics  . Alcohol use: Yes    Comment: occasional  . Drug  use: No     Allergies   Cephalexin   Review of Systems Review of Systems  All other systems reviewed and are negative.    Physical Exam Updated Vital Signs BP 111/72   Pulse 67   Temp 98.2 F (36.8 C) (Oral)   Resp 15   Ht 5\' 5"  (1.651 m)   Wt 88.5 kg   LMP 05/01/2018   SpO2 100%   BMI 32.45 kg/m   Physical Exam Vitals signs and nursing note reviewed.  Constitutional:      Appearance: She is well-developed.  HENT:     Head: Normocephalic and atraumatic.  Eyes:     Conjunctiva/sclera: Conjunctivae normal.     Pupils: Pupils are equal, round, and reactive to light.     Comments: Swelling of the right lower eyelid consistent with blepharitis, no abscess  Neck:     Musculoskeletal: Normal range of motion and neck supple.  Cardiovascular:     Rate and Rhythm: Normal rate and regular rhythm.     Heart sounds: No murmur. No friction rub. No gallop.   Pulmonary:     Effort: Pulmonary effort is normal. No respiratory distress.     Breath sounds: Normal breath sounds. No wheezing or rales.  Chest:     Chest wall: No tenderness.  Abdominal:     General: Bowel sounds are normal. There is no distension.     Palpations: Abdomen is soft. There is no mass.     Tenderness: There is no abdominal tenderness. There is no guarding or rebound.  Genitourinary:    Comments: Chaperone present for pelvic exam, there is mild white discharge, there is some cervical motion tenderness Musculoskeletal: Normal range of motion.        General: No tenderness.  Skin:    General: Skin is warm and dry.  Neurological:     Mental Status: She is alert and oriented to person, place, and time.  Psychiatric:        Behavior: Behavior normal.        Thought Content: Thought content normal.        Judgment: Judgment normal.      ED Treatments / Results  Labs (all labs ordered are listed, but only abnormal results are displayed) Labs Reviewed  COMPREHENSIVE METABOLIC PANEL - Abnormal;  Notable for the following components:      Result Value   CO2 18 (*)    Calcium 8.7 (*)    All other components within normal limits  CBC - Abnormal; Notable for the following components:   Hemoglobin 11.6 (*)    All other  components within normal limits  WET PREP, GENITAL  LIPASE, BLOOD  URINALYSIS, ROUTINE W REFLEX MICROSCOPIC  I-STAT BETA HCG BLOOD, ED ([facility], [facility], [facility] ONLY)  GC/CHLAMYDIA PROBE AMP ([place]) NOT AT [facility]    EKG None  Radiology No results found.  Procedures Procedures (including critical care time)  Medications Ordered in ED Medications  sodium chloride 0.9 % bolus 1,000 mL (1,000 mLs Intravenous New Bag/Given 06/05/18 0342)  ketorolac (TORADOL) 30 MG/ML injection 30 mg (30 mg Intravenous Given 06/05/18 0343)  metoCLOPramide (REGLAN) injection 10 mg (10 mg Intravenous Given 06/05/18 0342)  diphenhydrAMINE (BENADRYL) injection 25 mg (25 mg Intravenous Given 06/05/18 0342)  erythromycin ophthalmic ointment 1 application (1 application Right Eye Given 06/05/18 0342)     Initial Impression / Assessment and Plan / ED Course  I have reviewed the triage vital signs and the nursing notes.  Pertinent labs & imaging results that were available during my care of the patient were reviewed by me and considered in my medical decision making (see chart for details).    Patient with several complaints. 1.  Blepharitis.  Will treat with Romycin ointment.  Recommend ophthalmology follow-up if not better in 2 days. 2.  Vaginal discharge and suprapubic pain.  History of cervicitis and gonorrhea and chlamydia as well as bacterial vaginosis.  She is allergic to Rocephin, will give 2 g azithromycin.  We will also discharge home with Flagyl. 3.  Headache: Resolved in ED after headache cocktail.    Final Clinical Impressions(s) / ED Diagnoses   Final diagnoses:  Blepharitis of right lower eyelid, unspecified type  Vaginal discharge  Suprapubic pain  Acute nonintractable headache,  unspecified headache type    ED Discharge Orders         Ordered    erythromycin ophthalmic ointment     06/05/18 0424    metroNIDAZOLE (FLAGYL) 500 MG tablet  2 times daily     06/05/18 0424           Montine Circle, PA-C 06/05/18 [id number]    Duffy Bruce, MD 06/05/18 [phone]

## 2018-06-05 NOTE — ED Triage Notes (Signed)
C/O of right eye pain that woke pt up along with n/v starting 22:30. Pt also reports headache to right side and shooting pain in lower abdomen.

## 2018-06-06 LAB — GC/CHLAMYDIA PROBE AMP (~~LOC~~) NOT AT ARMC
Chlamydia: NEGATIVE
NEISSERIA GONORRHEA: NEGATIVE

## 2018-11-06 DIAGNOSIS — H01122 Discoid lupus erythematosus of right lower eyelid: Secondary | ICD-10-CM | POA: Diagnosis not present

## 2018-11-06 DIAGNOSIS — H01125 Discoid lupus erythematosus of left lower eyelid: Secondary | ICD-10-CM | POA: Diagnosis not present

## 2018-11-06 DIAGNOSIS — H16223 Keratoconjunctivitis sicca, not specified as Sjogren's, bilateral: Secondary | ICD-10-CM | POA: Diagnosis not present

## 2018-11-09 ENCOUNTER — Emergency Department (HOSPITAL_COMMUNITY): Payer: Medicaid Other

## 2018-11-09 ENCOUNTER — Observation Stay (HOSPITAL_COMMUNITY)
Admission: EM | Admit: 2018-11-09 | Discharge: 2018-11-10 | Disposition: A | Discharge status: Home / Self Care | Payer: Medicaid Other | Attending: Family Medicine | Admitting: Family Medicine

## 2018-11-09 ENCOUNTER — Encounter (HOSPITAL_COMMUNITY): Payer: Self-pay | Admitting: Emergency Medicine

## 2018-11-09 ENCOUNTER — Other Ambulatory Visit: Payer: Self-pay

## 2018-11-09 DIAGNOSIS — Z881 Allergy status to other antibiotic agents status: Secondary | ICD-10-CM | POA: Insufficient documentation

## 2018-11-09 DIAGNOSIS — R0602 Shortness of breath: Secondary | ICD-10-CM | POA: Diagnosis not present

## 2018-11-09 DIAGNOSIS — M069 Rheumatoid arthritis, unspecified: Secondary | ICD-10-CM | POA: Insufficient documentation

## 2018-11-09 DIAGNOSIS — R1012 Left upper quadrant pain: Secondary | ICD-10-CM | POA: Insufficient documentation

## 2018-11-09 DIAGNOSIS — R0781 Pleurodynia: Secondary | ICD-10-CM | POA: Diagnosis not present

## 2018-11-09 DIAGNOSIS — R079 Chest pain, unspecified: Secondary | ICD-10-CM | POA: Diagnosis not present

## 2018-11-09 DIAGNOSIS — R1110 Vomiting, unspecified: Secondary | ICD-10-CM | POA: Insufficient documentation

## 2018-11-09 DIAGNOSIS — D649 Anemia, unspecified: Secondary | ICD-10-CM | POA: Diagnosis not present

## 2018-11-09 DIAGNOSIS — R1032 Left lower quadrant pain: Secondary | ICD-10-CM | POA: Diagnosis not present

## 2018-11-09 DIAGNOSIS — K2960 Other gastritis without bleeding: Secondary | ICD-10-CM | POA: Insufficient documentation

## 2018-11-09 DIAGNOSIS — Z9049 Acquired absence of other specified parts of digestive tract: Secondary | ICD-10-CM | POA: Diagnosis not present

## 2018-11-09 DIAGNOSIS — M329 Systemic lupus erythematosus, unspecified: Secondary | ICD-10-CM | POA: Diagnosis not present

## 2018-11-09 DIAGNOSIS — E876 Hypokalemia: Secondary | ICD-10-CM

## 2018-11-09 DIAGNOSIS — R1030 Lower abdominal pain, unspecified: Secondary | ICD-10-CM

## 2018-11-09 DIAGNOSIS — F1290 Cannabis use, unspecified, uncomplicated: Secondary | ICD-10-CM | POA: Insufficient documentation

## 2018-11-09 DIAGNOSIS — F17210 Nicotine dependence, cigarettes, uncomplicated: Secondary | ICD-10-CM | POA: Diagnosis not present

## 2018-11-09 DIAGNOSIS — R1031 Right lower quadrant pain: Secondary | ICD-10-CM | POA: Diagnosis not present

## 2018-11-09 DIAGNOSIS — K219 Gastro-esophageal reflux disease without esophagitis: Secondary | ICD-10-CM

## 2018-11-09 DIAGNOSIS — Z1159 Encounter for screening for other viral diseases: Secondary | ICD-10-CM | POA: Insufficient documentation

## 2018-11-09 DIAGNOSIS — M549 Dorsalgia, unspecified: Secondary | ICD-10-CM | POA: Diagnosis not present

## 2018-11-09 DIAGNOSIS — N939 Abnormal uterine and vaginal bleeding, unspecified: Secondary | ICD-10-CM | POA: Diagnosis not present

## 2018-11-09 DIAGNOSIS — I10 Essential (primary) hypertension: Secondary | ICD-10-CM | POA: Diagnosis not present

## 2018-11-09 DIAGNOSIS — R112 Nausea with vomiting, unspecified: Principal | ICD-10-CM

## 2018-11-09 DIAGNOSIS — R52 Pain, unspecified: Secondary | ICD-10-CM | POA: Diagnosis not present

## 2018-11-09 DIAGNOSIS — R1084 Generalized abdominal pain: Secondary | ICD-10-CM | POA: Diagnosis not present

## 2018-11-09 LAB — D-DIMER, QUANTITATIVE: D-Dimer, Quant: 1.17 ug/mL-FEU — ABNORMAL HIGH (ref 0.00–0.50)

## 2018-11-09 LAB — COMPREHENSIVE METABOLIC PANEL
ALT: 14 U/L (ref 0–44)
AST: 16 U/L (ref 15–41)
Albumin: 3.4 g/dL — ABNORMAL LOW (ref 3.5–5.0)
Alkaline Phosphatase: 64 U/L (ref 38–126)
Anion gap: 11 (ref 5–15)
BUN: <5 mg/dL — ABNORMAL LOW (ref 6–20)
CO2: 15 mmol/L — ABNORMAL LOW (ref 22–32)
Calcium: 8.3 mg/dL — ABNORMAL LOW (ref 8.9–10.3)
Chloride: 111 mmol/L (ref 98–111)
Creatinine, Ser: 0.85 mg/dL (ref 0.44–1.00)
GFR calc Af Amer: >60 mL/min (ref >60)
GFR calc non Af Amer: >60 mL/min (ref >60)
Glucose, Bld: 102 mg/dL — ABNORMAL HIGH (ref 70–99)
Potassium: 3.3 mmol/L — ABNORMAL LOW (ref 3.5–5.1)
Sodium: 137 mmol/L (ref 135–145)
Total Bilirubin: 0.7 mg/dL (ref 0.3–1.2)
Total Protein: 6.7 g/dL (ref 6.5–8.1)

## 2018-11-09 LAB — I-STAT BETA HCG BLOOD, ED (MC, WL, AP ONLY): I-stat hCG, quantitative: <5 m[IU]/mL (ref <5)

## 2018-11-09 LAB — CBC WITH DIFFERENTIAL/PLATELET
Abs Immature Granulocytes: 0.01 10*3/uL (ref 0.00–0.07)
Basophils Absolute: 0 10*3/uL (ref 0.0–0.1)
Basophils Relative: 1 %
Eosinophils Absolute: 0.1 10*3/uL (ref 0.0–0.5)
Eosinophils Relative: 2 %
HCT: 35.9 % — ABNORMAL LOW (ref 36.0–46.0)
Hemoglobin: 11.6 g/dL — ABNORMAL LOW (ref 12.0–15.0)
Immature Granulocytes: 0 %
Lymphocytes Relative: 65 %
Lymphs Abs: 3.3 10*3/uL (ref 0.7–4.0)
MCH: 26.9 pg (ref 26.0–34.0)
MCHC: 32.3 g/dL (ref 30.0–36.0)
MCV: 83.3 fL (ref 80.0–100.0)
Monocytes Absolute: 0.5 10*3/uL (ref 0.1–1.0)
Monocytes Relative: 10 %
Neutro Abs: 1.1 10*3/uL — ABNORMAL LOW (ref 1.7–7.7)
Neutrophils Relative %: 22 %
Platelets: 221 10*3/uL (ref 150–400)
RBC: 4.31 MIL/uL (ref 3.87–5.11)
RDW: 14.6 % (ref 11.5–15.5)
WBC: 5 10*3/uL (ref 4.0–10.5)
nRBC: 0 % (ref 0.0–0.2)

## 2018-11-09 LAB — WET PREP, GENITAL
Clue Cells Wet Prep HPF POC: NONE SEEN
Sperm: NONE SEEN
Trich, Wet Prep: NONE SEEN
Yeast Wet Prep HPF POC: NONE SEEN

## 2018-11-09 LAB — LIPASE, BLOOD: Lipase: 24 U/L (ref 11–51)

## 2018-11-09 LAB — TROPONIN I (HIGH SENSITIVITY)
Troponin I (High Sensitivity): <2 ng/L (ref <18)
Troponin I (High Sensitivity): <2 ng/L (ref <18)

## 2018-11-09 LAB — SARS CORONAVIRUS 2 BY RT PCR (HOSPITAL ORDER, PERFORMED IN ~~LOC~~ HOSPITAL LAB): SARS Coronavirus 2: NEGATIVE

## 2018-11-09 MED ORDER — HYDROMORPHONE HCL 1 MG/ML IJ SOLN
1.0000 mg | Freq: Once | INTRAMUSCULAR | Status: AC
  Administered 2018-11-09: 1 mg via INTRAVENOUS
  Filled 2018-11-09: qty 1

## 2018-11-09 MED ORDER — HYDROMORPHONE HCL 1 MG/ML IJ SOLN
0.5000 mg | Freq: Once | INTRAMUSCULAR | Status: AC
  Administered 2018-11-09: 20:00:00 0.5 mg via INTRAVENOUS
  Filled 2018-11-09: qty 1

## 2018-11-09 MED ORDER — ACETAMINOPHEN 650 MG RE SUPP: 650.0000 mg | Freq: Four times a day (QID) | RECTAL | Status: DC | PRN

## 2018-11-09 MED ORDER — PROMETHAZINE HCL 25 MG/ML IJ SOLN
12.5000 mg | Freq: Once | INTRAMUSCULAR | Status: AC
  Administered 2018-11-09: 17:00:00 12.5 mg via INTRAVENOUS
  Filled 2018-11-09: qty 1

## 2018-11-09 MED ORDER — ONDANSETRON HCL 4 MG PO TABS: 4.0000 mg | ORAL_TABLET | Freq: Four times a day (QID) | ORAL | Status: DC | PRN

## 2018-11-09 MED ORDER — FAMOTIDINE IN NACL 20-0.9 MG/50ML-% IV SOLN
20.0000 mg | Freq: Two times a day (BID) | INTRAVENOUS | Status: DC
  Administered 2018-11-09 – 2018-11-10 (×2): 20 mg via INTRAVENOUS
  Filled 2018-11-09 (×3): qty 50

## 2018-11-09 MED ORDER — ACETAMINOPHEN 325 MG PO TABS: 650.0000 mg | ORAL_TABLET | Freq: Four times a day (QID) | ORAL | Status: DC | PRN

## 2018-11-09 MED ORDER — ONDANSETRON HCL 4 MG/2ML IJ SOLN: 4.0000 mg | Freq: Four times a day (QID) | INTRAMUSCULAR | Status: DC | PRN

## 2018-11-09 MED ORDER — POTASSIUM CHLORIDE IN NACL 20-0.9 MEQ/L-% IV SOLN
INTRAVENOUS | Status: AC
  Administered 2018-11-09: 23:00:00 via INTRAVENOUS
  Filled 2018-11-09: qty 1000

## 2018-11-09 MED ORDER — IOHEXOL 350 MG/ML SOLN
100.0000 mL | Freq: Once | INTRAVENOUS | Status: AC | PRN
  Administered 2018-11-09: 100 mL via INTRAVENOUS

## 2018-11-09 MED ORDER — ONDANSETRON HCL 4 MG/2ML IJ SOLN
4.0000 mg | Freq: Once | INTRAMUSCULAR | Status: AC
  Administered 2018-11-09: 4 mg via INTRAVENOUS
  Filled 2018-11-09: qty 2

## 2018-11-09 MED ORDER — HYDROMORPHONE HCL 1 MG/ML IJ SOLN: 0.5000 mg | INTRAMUSCULAR | Status: DC | PRN

## 2018-11-09 MED ORDER — PROMETHAZINE HCL 25 MG/ML IJ SOLN: 12.5000 mg | Freq: Four times a day (QID) | INTRAMUSCULAR | Status: DC | PRN

## 2018-11-09 MED ORDER — POTASSIUM CHLORIDE CRYS ER 20 MEQ PO TBCR
40.0000 meq | EXTENDED_RELEASE_TABLET | Freq: Once | ORAL | Status: AC
  Administered 2018-11-09: 40 meq via ORAL
  Filled 2018-11-09 (×2): qty 2

## 2018-11-09 MED ORDER — SODIUM CHLORIDE 0.9 % IV BOLUS
1000.0000 mL | Freq: Once | INTRAVENOUS | Status: AC
  Administered 2018-11-09: 1000 mL via INTRAVENOUS

## 2018-11-09 MED ORDER — ENOXAPARIN SODIUM 40 MG/0.4ML ~~LOC~~ SOLN
40.0000 mg | SUBCUTANEOUS | Status: DC
  Filled 2018-11-09: qty 0.4

## 2018-11-09 MED ORDER — HALOPERIDOL LACTATE 5 MG/ML IJ SOLN
2.0000 mg | Freq: Once | INTRAMUSCULAR | Status: AC
  Administered 2018-11-09: 2 mg via INTRAVENOUS
  Filled 2018-11-09: qty 1

## 2018-11-09 NOTE — ED Notes (Signed)
Got patient undress on the monitor did ekg shown to Dr Francia Greaves patient is resting with nurse at bedside

## 2018-11-09 NOTE — Plan of Care (Signed)
  Problem: Education: Goal: Knowledge of General Education information will improve Description: Including pain rating scale, medication(s)/side effects and non-pharmacologic comfort measures Outcome: Progressing   Problem: Clinical Measurements: Goal: Ability to maintain clinical measurements within normal limits will improve Outcome: Progressing Goal: Will remain free from infection Outcome: Progressing   Problem: Safety: Goal: Ability to remain free from injury will improve Outcome: Progressing   Problem: Skin Integrity: Goal: Risk for impaired skin integrity will decrease Outcome: Progressing

## 2018-11-09 NOTE — ED Provider Notes (Signed)
[facility] EMERGENCY DEPARTMENT Provider Note   CSN: [id number] Arrival date & time: 11/09/18  1140    History   Chief Complaint Chief Complaint  Patient presents with   Abdominal Pain   Nausea   Emesis    HPI Rhonda Mcgee is a 33 y.o. female with history of lupus and rheumatoid arthritis presents for evaluation of multiple complaints.  She reports left-sided chest and abdominal pains radiating to the back for the last week or so.  Pain is constant, sharp, worsens with deep inspiration.  She has had progressively worsening nausea for the last 3 weeks or so and developed vomiting yesterday.  Has had multiple episodes of nonbloody nonbilious emesis.  She also notes that she has had some abnormal vaginal bleeding for the last 2 weeks which she describes as a light spotting.  Feels as though it is difficult to take a deep breath due to the pain.  No fevers, urinary symptoms, diarrhea, constipation, melena, hematochezia.  She has tried ibuprofen, Motrin, and Tylenol without significant relief of her symptoms.  She denies any suspicious food intake or recent travel.  Denies recent surgeries, hemoptysis, prior history of DVT or PE, or hormone replacement therapy.     `  The history is provided by the patient.    Past Medical History:  Diagnosis Date   Chlamydia    Gonorrhea    Herpes genitalia    Lupus ([place])    Rheumatoid arthritis(714.0)     Patient Active Problem List   Diagnosis Date Noted   Intractable nausea and vomiting 11/09/2018   Lupus (HCC)    Hypokalemia    Intractable vomiting     Past Surgical History:  Procedure Laterality Date   ADENOIDECTOMY     CHOLECYSTECTOMY     TONSILLECTOMY     TUBAL LIGATION       OB History    Gravida      Para      Term      Preterm      AB      Living  2     SAB      TAB      Ectopic      Multiple      Live Births               Home Medications    Prior to  Admission medications   Not on File    Family History History reviewed. No pertinent family history.  Social History Social History   Tobacco Use   Smoking status: Current Some Day Smoker    Types: Cigarettes   Smokeless tobacco: Never Used  Substance Use Topics   Alcohol use: Yes    Comment: occasional   Drug use: No     Allergies   Cephalexin   Review of Systems Review of Systems  Constitutional: Negative for chills and fever.  Respiratory: Positive for shortness of breath.   Cardiovascular: Positive for chest pain.  Gastrointestinal: Positive for abdominal pain, nausea and vomiting. Negative for constipation and diarrhea.  Genitourinary: Positive for vaginal bleeding. Negative for dysuria, frequency, hematuria and urgency.  All other systems reviewed and are negative.    Physical Exam Updated Vital Signs BP 101/67    Pulse (!) 52    Temp 97.9 F (36.6 C) (Oral)    Resp 16    Ht 5\' 5"  (1.651 m)    Wt 93 kg    SpO2 100%  BMI 34.11 kg/m   Physical Exam Vitals signs and nursing note reviewed.  Constitutional:      General: She is not in acute distress.    Appearance: She is well-developed.     Comments: Appears uncomfortable.   HENT:     Head: Normocephalic and atraumatic.  Eyes:     General:        Right eye: No discharge.        Left eye: No discharge.     Conjunctiva/sclera: Conjunctivae normal.  Neck:     Vascular: No JVD.     Trachea: No tracheal deviation.  Cardiovascular:     Rate and Rhythm: Normal rate and regular rhythm.     Heart sounds: Normal heart sounds.  Pulmonary:     Effort: Pulmonary effort is normal.     Breath sounds: Normal breath sounds.  Chest:     Chest wall: No deformity or crepitus.       Comments: Left sided chest tenderness to palpation, with no deformity, crepitus ecchymosis, or flail segment. Abdominal:     General: Abdomen is flat. Bowel sounds are decreased. There is no distension.     Palpations: Abdomen is  soft.     Tenderness: There is abdominal tenderness in the right lower quadrant, suprapubic area and left lower quadrant. There is no right CVA tenderness, left CVA tenderness, guarding or rebound. Negative signs include Murphy's sign, Rovsing's sign, McBurney's sign, psoas sign and obturator sign.  Genitourinary:    Vagina: Vaginal discharge present. No tenderness.     Cervix: Discharge present. No cervical motion tenderness.     Uterus: Not tender.      Adnexa:        Right: No tenderness or fullness.         Left: No tenderness or fullness.       Comments: Examination performed in the presence of a chaperone.  No masses or lesions to the external genitalia.  Cervix has some petechia and there is a moderate amount of yellow-white discharge in the vaginal vault.  There is no cervical motion tenderness or adnexal tenderness. Skin:    General: Skin is warm and dry.     Findings: No erythema.  Neurological:     Mental Status: She is alert.  Psychiatric:        Behavior: Behavior normal.      ED Treatments / Results  Labs (all labs ordered are listed, but only abnormal results are displayed) Labs Reviewed  WET PREP, GENITAL - Abnormal; Notable for the following components:      Result Value   WBC, Wet Prep HPF POC MANY (*)    All other components within normal limits  CBC WITH DIFFERENTIAL/PLATELET - Abnormal; Notable for the following components:   Hemoglobin 11.6 (*)    HCT 35.9 (*)    Neutro Abs 1.1 (*)    All other components within normal limits  COMPREHENSIVE METABOLIC PANEL - Abnormal; Notable for the following components:   Potassium 3.3 (*)    CO2 15 (*)    Glucose, Bld 102 (*)    BUN <5 (*)    Calcium 8.3 (*)    Albumin 3.4 (*)    All other components within normal limits  D-DIMER, QUANTITATIVE (NOT AT [facility]) - Abnormal; Notable for the following components:   D-Dimer, Quant 1.17 (*)    All other components within normal limits  SARS CORONAVIRUS 2 (HOSPITAL ORDER,  [facility])  LIPASE, BLOOD  URINALYSIS, ROUTINE W REFLEX MICROSCOPIC  RPR  HIV ANTIBODY (ROUTINE TESTING W REFLEX)  RAPID URINE DRUG SCREEN, HOSP PERFORMED  CBC WITH DIFFERENTIAL/PLATELET  COMPREHENSIVE METABOLIC PANEL  MAGNESIUM  C-REACTIVE PROTEIN  C-REACTIVE PROTEIN  SEDIMENTATION RATE  SEDIMENTATION RATE  C3 COMPLEMENT  C4 COMPLEMENT  ANTI-DNA ANTIBODY, DOUBLE-STRANDED  I-STAT BETA HCG BLOOD, ED ([facility], [facility], [facility] ONLY)  GC/CHLAMYDIA PROBE AMP ([place]) NOT AT [facility]  TROPONIN I (HIGH SENSITIVITY)  TROPONIN I (HIGH SENSITIVITY)    EKG EKG Interpretation  Date/Time:  Monday November 09 2018 11:51:59 EDT Ventricular Rate:  63 PR Interval:    QRS Duration: 83 QT Interval:  418 QTC Calculation: 428 R Axis:   55 Text Interpretation:  Sinus rhythm Baseline wander in lead(s) V3 Confirmed by Dene Gentry [phone]) on 11/09/2018 11:56:37 AM   Radiology Ct Abdomen Pelvis Wo Contrast  Result Date: 11/09/2018 CLINICAL DATA:  Left-sided chest and back pain. EXAM: CT ANGIOGRAPHY CHEST CT ABDOMEN AND PELVIS WITH CONTRAST TECHNIQUE: Multidetector CT imaging of the chest was performed using the standard protocol during bolus administration of intravenous contrast. Multiplanar CT image reconstructions and MIPs were obtained to evaluate the vascular anatomy. Multidetector CT imaging of the abdomen and pelvis was performed using the standard protocol during bolus administration of intravenous contrast. CONTRAST:  154mL OMNIPAQUE IOHEXOL 350 MG/ML SOLN COMPARISON:  Chest CT 08/25/2018 FINDINGS: CTA CHEST FINDINGS Cardiovascular: The heart is normal in size. No pericardial effusion. The aorta is normal in caliber. No obvious dissection. The branch vessels are patent. The pulmonary arterial tree is well opacified. No filling defects to suggest pulmonary embolism. Mediastinum/Nodes: Prominent soft tissue in the anterior mediastinum is likely benign thymic tissue in this young  patient. It measures 15 Hounsfield units and has a fairly normal triangular shape anteriorly. No change since the prior CT scan. Scattered mediastinal and hilar lymph nodes but no mass or adenopathy. The esophagus is grossly normal. Lungs/Pleura: No acute pulmonary findings. No worrisome pulmonary lesions. No pleural effusions. Musculoskeletal: No breast masses, supraclavicular or axillary adenopathy. Thyroid gland is grossly normal. The bony thorax is intact. Review of the MIP images confirms the above findings. CT ABDOMEN and PELVIS FINDINGS Hepatobiliary: No focal hepatic lesions or intrahepatic biliary dilatation. The gallbladder is surgically absent. No common bile duct dilatation. Pancreas: No mass, inflammation or ductal dilatation. Spleen: Normal size.  No focal lesions. Adrenals/Urinary Tract: The adrenal glands and kidneys are unremarkable. The bladder is unremarkable. Stomach/Bowel: The stomach, duodenum, small bowel and colon are grossly normal without oral contrast. No acute inflammatory changes, mass lesions or obstructive findings. The terminal ileum and appendix are normal. Vascular/Lymphatic: The aorta is normal in caliber. No dissection. The branch vessels are patent. The major venous structures are patent. No mesenteric or retroperitoneal mass or adenopathy. Small scattered lymph nodes are noted. Reproductive: The uterus is retroverted.  Both ovaries are normal. Other: No pelvic mass or adenopathy. No free pelvic fluid collections. No inguinal mass or adenopathy. No abdominal wall hernia or subcutaneous lesions. Musculoskeletal: No significant bony findings. Review of the MIP images confirms the above findings. IMPRESSION: 1. No CT findings for pulmonary embolism. 2. No acute pulmonary findings. 3. Stable thymic tissue in the anterior mediastinum. 4. No acute abdominal/pelvic findings, mass lesions or adenopathy. Electronically Signed   By: Marijo Sanes M.D.   On: 11/09/2018 16:27   Ct Angio  Chest Pe W And/or Wo Contrast  Result Date: 11/09/2018 CLINICAL DATA:  Left-sided chest and back  pain. EXAM: CT ANGIOGRAPHY CHEST CT ABDOMEN AND PELVIS WITH CONTRAST TECHNIQUE: Multidetector CT imaging of the chest was performed using the standard protocol during bolus administration of intravenous contrast. Multiplanar CT image reconstructions and MIPs were obtained to evaluate the vascular anatomy. Multidetector CT imaging of the abdomen and pelvis was performed using the standard protocol during bolus administration of intravenous contrast. CONTRAST:  154mL OMNIPAQUE IOHEXOL 350 MG/ML SOLN COMPARISON:  Chest CT 08/25/2018 FINDINGS: CTA CHEST FINDINGS Cardiovascular: The heart is normal in size. No pericardial effusion. The aorta is normal in caliber. No obvious dissection. The branch vessels are patent. The pulmonary arterial tree is well opacified. No filling defects to suggest pulmonary embolism. Mediastinum/Nodes: Prominent soft tissue in the anterior mediastinum is likely benign thymic tissue in this young patient. It measures 15 Hounsfield units and has a fairly normal triangular shape anteriorly. No change since the prior CT scan. Scattered mediastinal and hilar lymph nodes but no mass or adenopathy. The esophagus is grossly normal. Lungs/Pleura: No acute pulmonary findings. No worrisome pulmonary lesions. No pleural effusions. Musculoskeletal: No breast masses, supraclavicular or axillary adenopathy. Thyroid gland is grossly normal. The bony thorax is intact. Review of the MIP images confirms the above findings. CT ABDOMEN and PELVIS FINDINGS Hepatobiliary: No focal hepatic lesions or intrahepatic biliary dilatation. The gallbladder is surgically absent. No common bile duct dilatation. Pancreas: No mass, inflammation or ductal dilatation. Spleen: Normal size.  No focal lesions. Adrenals/Urinary Tract: The adrenal glands and kidneys are unremarkable. The bladder is unremarkable. Stomach/Bowel: The  stomach, duodenum, small bowel and colon are grossly normal without oral contrast. No acute inflammatory changes, mass lesions or obstructive findings. The terminal ileum and appendix are normal. Vascular/Lymphatic: The aorta is normal in caliber. No dissection. The branch vessels are patent. The major venous structures are patent. No mesenteric or retroperitoneal mass or adenopathy. Small scattered lymph nodes are noted. Reproductive: The uterus is retroverted.  Both ovaries are normal. Other: No pelvic mass or adenopathy. No free pelvic fluid collections. No inguinal mass or adenopathy. No abdominal wall hernia or subcutaneous lesions. Musculoskeletal: No significant bony findings. Review of the MIP images confirms the above findings. IMPRESSION: 1. No CT findings for pulmonary embolism. 2. No acute pulmonary findings. 3. Stable thymic tissue in the anterior mediastinum. 4. No acute abdominal/pelvic findings, mass lesions or adenopathy. Electronically Signed   By: Marijo Sanes M.D.   On: 11/09/2018 16:27   Dg Chest Portable 1 View  Result Date: 11/09/2018 CLINICAL DATA:  33 year old presenting with acute onset of chest pain, shortness of breath and generalized weakness. Current smoker. EXAM: PORTABLE CHEST 1 VIEW COMPARISON:  CTA chest 08/25/2018. Chest x-rays 05/06/2018 and earlier. FINDINGS: Suboptimal inspiration accounts for crowded bronchovascular markings, especially in the bases, and accentuates the cardiac silhouette. Taking this into account, cardiomediastinal silhouette unremarkable and unchanged. Lungs clear. Bronchovascular markings normal. Pulmonary vascularity normal. No visible pleural effusions. No pneumothorax. IMPRESSION: Suboptimal inspiration. No acute cardiopulmonary disease. Electronically Signed   By: Evangeline Dakin M.D.   On: 11/09/2018 13:27    Procedures Procedures (including critical care time)  Medications Ordered in ED Medications  enoxaparin (LOVENOX) injection 40 mg  (has no administration in time range)  0.9 % NaCl with KCl 20 mEq/ L  infusion (has no administration in time range)  acetaminophen (TYLENOL) tablet 650 mg (has no administration in time range)    Or  acetaminophen (TYLENOL) suppository 650 mg (has no administration in time range)  ondansetron (ZOFRAN) tablet 4  mg (has no administration in time range)    Or  ondansetron (ZOFRAN) injection 4 mg (has no administration in time range)  promethazine (PHENERGAN) injection 12.5 mg (has no administration in time range)  HYDROmorphone (DILAUDID) injection 0.5-1 mg (has no administration in time range)  famotidine (PEPCID) IVPB 20 mg premix (has no administration in time range)  HYDROmorphone (DILAUDID) injection 1 mg (1 mg Intravenous Given 11/09/18 1313)  ondansetron (ZOFRAN) injection 4 mg (4 mg Intravenous Given 11/09/18 1312)  sodium chloride 0.9 % bolus 1,000 mL (0 mLs Intravenous Stopped 11/09/18 1618)  iohexol (OMNIPAQUE) 350 MG/ML injection 100 mL (100 mLs Intravenous Contrast Given 11/09/18 1616)  promethazine (PHENERGAN) injection 12.5 mg (12.5 mg Intravenous Given 11/09/18 1710)  potassium chloride SA (K-DUR) CR tablet 40 mEq (40 mEq Oral Given 11/09/18 1756)  haloperidol lactate (HALDOL) injection 2 mg (2 mg Intravenous Given 11/09/18 1930)  sodium chloride 0.9 % bolus 1,000 mL (1,000 mLs Intravenous New Bag/Given 11/09/18 1930)  HYDROmorphone (DILAUDID) injection 0.5 mg (0.5 mg Intravenous Given 11/09/18 1931)     Initial Impression / Assessment and Plan / ED Course  I have reviewed the triage vital signs and the nursing notes.  Pertinent labs & imaging results that were available during my care of the patient were reviewed by me and considered in my medical decision making (see chart for details).        Patient presents for evaluation of multiple complaints.  She is having pleuritic chest pain reproducible on palpation, abdominal pain, vaginal spotting, nausea and vomiting.  Her symptoms  have been progressively worsening over the last week or so but the nausea and vomiting really began yesterday.  She is afebrile, vital signs are stable.  She is uncomfortable but nontoxic in appearance.  No peritoneal signs on examination of the abdomen.  Lab work shows no leukocytosis, mild anemia, mild hypokalemia, no renal insufficiency.  Her d-dimer was elevated so a PE study was obtained which showed no evidence of PE or other acute cardiopulmonary abnormalities.  Imaging of the abdomen shows no evidence of acute surgical abdominal pathology.  Pelvic exam does not suggest PID and her imaging is reassuring that she does not have a TOA or ovarian torsion given her ovaries are normal size.  No concern for ectopic pregnancy as pregnancy test is negative.  However, patient has had persistent emesis in the ED despite multiple antiemetics.  On reassessment she does tell me that she usually smokes marijuana daily but has not been able to since Monday due to feeling unwell.  Question possible hyperemesis cannabinoid versus gastritis versus lupus flare.  Spoke with Dr. Myna Hidalgo with [facility] hospitalist service who agrees to assume care of patient and bring her into the hospital for further evaluation and management.  Discussed with Dr. Francia Greaves who agrees with assessment and plan at this time.  Final Clinical Impressions(s) / ED Diagnoses   Final diagnoses:  Intractable vomiting with nausea, unspecified vomiting type  Lower abdominal pain  Pleuritic chest pain    ED Discharge Orders    None       Debroah Baller 11/09/18 2051    Valarie Merino, MD 11/26/18 1030

## 2018-11-09 NOTE — ED Triage Notes (Signed)
Pt coming from home. Complaining of LUQ pain, N/V x3 days. Pt states she was seen on Friday and had an appointment scheduled for 7/14. Pt complaining of pain radiating into left chest and left lower back. Pt had 1 episode of emesis prior to EMS arrival. Pt given 150 fentanyl by EMS pain decreased to 8/10. VSS.

## 2018-11-09 NOTE — ED Notes (Signed)
Patient transported to CT 

## 2018-11-09 NOTE — ED Notes (Signed)
ED TO INPATIENT HANDOFF REPORT  ED Nurse Name and Phone #: Ben [phone]  S Name/Age/Gender Rhonda Mcgee 33 y.o. female Room/Bed: 042C/042C  Code Status   Code Status: Full Code  Home/SNF/Other Home Patient oriented to: self, place, time and situation Is this baseline? Yes   Triage Complete: Triage complete  Chief Complaint ABD PAIN  Triage Note Pt coming from home. Complaining of LUQ pain, N/V x3 days. Pt states she was seen on Friday and had an appointment scheduled for 7/14. Pt complaining of pain radiating into left chest and left lower back. Pt had 1 episode of emesis prior to EMS arrival. Pt given 150 fentanyl by EMS pain decreased to 8/10. VSS.     Allergies Allergies  Allergen Reactions  . Cephalexin Hives    Level of Care/Admitting Diagnosis ED Disposition    ED Disposition Condition [facility]: [place] [[id number]]  Level of Care: Med-Surg [16]  I expect the patient will be discharged within 24 hours: Yes  LOW acuity---Tx typically complete <24 hrs---ACUTE conditions typically can be evaluated <24 hours---LABS likely to return to acceptable levels <24 hours---IS near functional baseline---EXPECTED to return to current living arrangement---NOT newly hypoxic: Meets criteria for 5C-Observation unit  Covid Evaluation: Asymptomatic Screening Protocol (No Symptoms)  Diagnosis: Intractable nausea and vomiting [528413]  Admitting Physician: Vianne Bulls [[id number]]  Attending Physician: Vianne Bulls [[id number]]  PT Class (Do Not Modify): Observation [104]  PT Acc Code (Do Not Modify): Observation [10022]       B Medical/Surgery History Past Medical History:  Diagnosis Date  . Chlamydia   . Gonorrhea   . Herpes genitalia   . Lupus ([place])   . Rheumatoid arthritis(714.0)    Past Surgical History:  Procedure Laterality Date  . ADENOIDECTOMY    . CHOLECYSTECTOMY    . TONSILLECTOMY    . TUBAL LIGATION       A IV  Location/Drains/Wounds Patient Lines/Drains/Airways Status   Active Line/Drains/Airways    Name:   Placement date:   Placement time:   Site:   Days:   Peripheral IV 11/09/18 Left Antecubital   11/09/18    1227    Antecubital   less than 1   Peripheral IV 11/09/18 Right Antecubital   11/09/18    1930    Antecubital   less than 1          Intake/Output Last 24 hours  Intake/Output Summary (Last 24 hours) at 11/09/2018 1956 Last data filed at 11/09/2018 1618 Gross per 24 hour  Intake 1000 ml  Output -  Net 1000 ml    Labs/Imaging Results for orders placed or performed during the hospital encounter of 11/09/18 (from the past 48 hour(s))  CBC with Differential     Status: Abnormal   Collection Time: 11/09/18 12:26 PM  Result Value Ref Range   WBC 5.0 4.0 - 10.5 K/uL   RBC 4.31 3.87 - 5.11 MIL/uL   Hemoglobin 11.6 (L) 12.0 - 15.0 g/dL   HCT 35.9 (L) 36.0 - 46.0 %   MCV 83.3 80.0 - 100.0 fL   MCH 26.9 26.0 - 34.0 pg   MCHC 32.3 30.0 - 36.0 g/dL   RDW 14.6 11.5 - 15.5 %   Platelets 221 150 - 400 K/uL   nRBC 0.0 0.0 - 0.2 %   Neutrophils Relative % 22 %   Neutro Abs 1.1 (L) 1.7 - 7.7 K/uL   Lymphocytes Relative 65 %  Lymphs Abs 3.3 0.7 - 4.0 K/uL   Monocytes Relative 10 %   Monocytes Absolute 0.5 0.1 - 1.0 K/uL   Eosinophils Relative 2 %   Eosinophils Absolute 0.1 0.0 - 0.5 K/uL   Basophils Relative 1 %   Basophils Absolute 0.0 0.0 - 0.1 K/uL   Immature Granulocytes 0 %   Abs Immature Granulocytes 0.01 0.00 - 0.07 K/uL    Comment: Performed at [facility], [place] [address], [place], [place] [address]  Comprehensive metabolic panel     Status: Abnormal   Collection Time: 11/09/18 12:26 PM  Result Value Ref Range   Sodium 137 135 - 145 mmol/L   Potassium 3.3 (L) 3.5 - 5.1 mmol/L   Chloride 111 98 - 111 mmol/L   CO2 15 (L) 22 - 32 mmol/L   Glucose, Bld 102 (H) 70 - 99 mg/dL   BUN <5 (L) 6 - 20 mg/dL   Creatinine, Ser 0.85 0.44 - 1.00 mg/dL   Calcium 8.3 (L) 8.9 -  10.3 mg/dL   Total Protein 6.7 6.5 - 8.1 g/dL   Albumin 3.4 (L) 3.5 - 5.0 g/dL   AST 16 15 - 41 U/L   ALT 14 0 - 44 U/L   Alkaline Phosphatase 64 38 - 126 U/L   Total Bilirubin 0.7 0.3 - 1.2 mg/dL   GFR calc non Af Amer >60 >60 mL/min   GFR calc Af Amer >60 >60 mL/min   Anion gap 11 5 - 15    Comment: Performed at [facility], [place] [address], [place], [place] [address]  Lipase, blood     Status: None   Collection Time: 11/09/18 12:26 PM  Result Value Ref Range   Lipase 24 11 - 51 U/L    Comment: Performed at [facility], [place] [address] [place], [place] [address]  Troponin I (High Sensitivity)     Status: None   Collection Time: 11/09/18 12:26 PM  Result Value Ref Range   Troponin I (High Sensitivity) <2 <18 ng/L    Comment: Performed at [facility] [address], [place], [place] [address]  I-Stat beta hCG blood, ED     Status: None   Collection Time: 11/09/18 12:32 PM  Result Value Ref Range   I-stat hCG, quantitative <5.0 <5 mIU/mL   Comment 3            Comment:   GEST. AGE      CONC.  (mIU/mL)   <=1 WEEK        5 - 50     2 WEEKS       50 - 500     3 WEEKS       100 - 10,000     4 WEEKS     1,000 - 30,000        FEMALE AND NON-PREGNANT FEMALE:     LESS THAN 5 mIU/mL   D-dimer, quantitative (not at [facility])     Status: Abnormal   Collection Time: 11/09/18  2:17 PM  Result Value Ref Range   D-Dimer, Quant 1.17 (H) 0.00 - 0.50 ug/mL-FEU    Comment: (NOTE) At the manufacturer cut-off of 0.50 ug/mL FEU, this assay has been documented to exclude PE with a sensitivity and negative predictive value of 97 to 99%.  At this time, this assay has not been approved by the FDA to exclude DVT/VTE. Results should be correlated with clinical presentation. Performed at [facility], [place] [address] [place], [place] [address]   Wet prep, genital     Status: Abnormal   Collection Time: 11/09/18  4:22 PM  Result Value Ref Range   Yeast Wet Prep HPF POC NONE  SEEN NONE SEEN   Trich, Wet Prep NONE SEEN NONE SEEN   Clue Cells Wet Prep HPF POC NONE SEEN NONE SEEN   WBC, Wet Prep HPF POC MANY (A) NONE SEEN   Sperm NONE SEEN     Comment: Performed at [facility], [place] [address], [place], [place] [address]   Ct Abdomen Pelvis Wo Contrast  Result Date: 11/09/2018 CLINICAL DATA:  Left-sided chest and back pain. EXAM: CT ANGIOGRAPHY CHEST CT ABDOMEN AND PELVIS WITH CONTRAST TECHNIQUE: Multidetector CT imaging of the chest was performed using the standard protocol during bolus administration of intravenous contrast. Multiplanar CT image reconstructions and MIPs were obtained to evaluate the vascular anatomy. Multidetector CT imaging of the abdomen and pelvis was performed using the standard protocol during bolus administration of intravenous contrast. CONTRAST:  142mL OMNIPAQUE IOHEXOL 350 MG/ML SOLN COMPARISON:  Chest CT 08/25/2018 FINDINGS: CTA CHEST FINDINGS Cardiovascular: The heart is normal in size. No pericardial effusion. The aorta is normal in caliber. No obvious dissection. The branch vessels are patent. The pulmonary arterial tree is well opacified. No filling defects to suggest pulmonary embolism. Mediastinum/Nodes: Prominent soft tissue in the anterior mediastinum is likely benign thymic tissue in this young patient. It measures 15 Hounsfield units and has a fairly normal triangular shape anteriorly. No change since the prior CT scan. Scattered mediastinal and hilar lymph nodes but no mass or adenopathy. The esophagus is grossly normal. Lungs/Pleura: No acute pulmonary findings. No worrisome pulmonary lesions. No pleural effusions. Musculoskeletal: No breast masses, supraclavicular or axillary adenopathy. Thyroid gland is grossly normal. The bony thorax is intact. Review of the MIP images confirms the above findings. CT ABDOMEN and PELVIS FINDINGS Hepatobiliary: No focal hepatic lesions or intrahepatic biliary dilatation. The gallbladder is surgically  absent. No common bile duct dilatation. Pancreas: No mass, inflammation or ductal dilatation. Spleen: Normal size.  No focal lesions. Adrenals/Urinary Tract: The adrenal glands and kidneys are unremarkable. The bladder is unremarkable. Stomach/Bowel: The stomach, duodenum, small bowel and colon are grossly normal without oral contrast. No acute inflammatory changes, mass lesions or obstructive findings. The terminal ileum and appendix are normal. Vascular/Lymphatic: The aorta is normal in caliber. No dissection. The branch vessels are patent. The major venous structures are patent. No mesenteric or retroperitoneal mass or adenopathy. Small scattered lymph nodes are noted. Reproductive: The uterus is retroverted.  Both ovaries are normal. Other: No pelvic mass or adenopathy. No free pelvic fluid collections. No inguinal mass or adenopathy. No abdominal wall hernia or subcutaneous lesions. Musculoskeletal: No significant bony findings. Review of the MIP images confirms the above findings. IMPRESSION: 1. No CT findings for pulmonary embolism. 2. No acute pulmonary findings. 3. Stable thymic tissue in the anterior mediastinum. 4. No acute abdominal/pelvic findings, mass lesions or adenopathy. Electronically Signed   By: Marijo Sanes M.D.   On: 11/09/2018 16:27   Ct Angio Chest Pe W And/or Wo Contrast  Result Date: 11/09/2018 CLINICAL DATA:  Left-sided chest and back pain. EXAM: CT ANGIOGRAPHY CHEST CT ABDOMEN AND PELVIS WITH CONTRAST TECHNIQUE: Multidetector CT imaging of the chest was performed using the standard protocol during bolus administration of intravenous contrast. Multiplanar CT image reconstructions and MIPs were obtained to evaluate the vascular anatomy. Multidetector  CT imaging of the abdomen and pelvis was performed using the standard protocol during bolus administration of intravenous contrast. CONTRAST:  140mL OMNIPAQUE IOHEXOL 350 MG/ML SOLN COMPARISON:  Chest CT 08/25/2018 FINDINGS: CTA CHEST  FINDINGS Cardiovascular: The heart is normal in size. No pericardial effusion. The aorta is normal in caliber. No obvious dissection. The branch vessels are patent. The pulmonary arterial tree is well opacified. No filling defects to suggest pulmonary embolism. Mediastinum/Nodes: Prominent soft tissue in the anterior mediastinum is likely benign thymic tissue in this young patient. It measures 15 Hounsfield units and has a fairly normal triangular shape anteriorly. No change since the prior CT scan. Scattered mediastinal and hilar lymph nodes but no mass or adenopathy. The esophagus is grossly normal. Lungs/Pleura: No acute pulmonary findings. No worrisome pulmonary lesions. No pleural effusions. Musculoskeletal: No breast masses, supraclavicular or axillary adenopathy. Thyroid gland is grossly normal. The bony thorax is intact. Review of the MIP images confirms the above findings. CT ABDOMEN and PELVIS FINDINGS Hepatobiliary: No focal hepatic lesions or intrahepatic biliary dilatation. The gallbladder is surgically absent. No common bile duct dilatation. Pancreas: No mass, inflammation or ductal dilatation. Spleen: Normal size.  No focal lesions. Adrenals/Urinary Tract: The adrenal glands and kidneys are unremarkable. The bladder is unremarkable. Stomach/Bowel: The stomach, duodenum, small bowel and colon are grossly normal without oral contrast. No acute inflammatory changes, mass lesions or obstructive findings. The terminal ileum and appendix are normal. Vascular/Lymphatic: The aorta is normal in caliber. No dissection. The branch vessels are patent. The major venous structures are patent. No mesenteric or retroperitoneal mass or adenopathy. Small scattered lymph nodes are noted. Reproductive: The uterus is retroverted.  Both ovaries are normal. Other: No pelvic mass or adenopathy. No free pelvic fluid collections. No inguinal mass or adenopathy. No abdominal wall hernia or subcutaneous lesions. Musculoskeletal:  No significant bony findings. Review of the MIP images confirms the above findings. IMPRESSION: 1. No CT findings for pulmonary embolism. 2. No acute pulmonary findings. 3. Stable thymic tissue in the anterior mediastinum. 4. No acute abdominal/pelvic findings, mass lesions or adenopathy. Electronically Signed   By: Marijo Sanes M.D.   On: 11/09/2018 16:27   Dg Chest Portable 1 View  Result Date: 11/09/2018 CLINICAL DATA:  33 year old presenting with acute onset of chest pain, shortness of breath and generalized weakness. Current smoker. EXAM: PORTABLE CHEST 1 VIEW COMPARISON:  CTA chest 08/25/2018. Chest x-rays 05/06/2018 and earlier. FINDINGS: Suboptimal inspiration accounts for crowded bronchovascular markings, especially in the bases, and accentuates the cardiac silhouette. Taking this into account, cardiomediastinal silhouette unremarkable and unchanged. Lungs clear. Bronchovascular markings normal. Pulmonary vascularity normal. No visible pleural effusions. No pneumothorax. IMPRESSION: Suboptimal inspiration. No acute cardiopulmonary disease. Electronically Signed   By: Evangeline Dakin M.D.   On: 11/09/2018 13:27    Pending Labs Unresulted Labs (From admission, onward)    Start     Ordered   11/16/18 0500  Creatinine, serum  (enoxaparin (LOVENOX)    CrCl >/= 30 ml/min)  Weekly,   R    Comments: while on enoxaparin therapy    11/09/18 1915   11/10/18 0500  CBC WITH DIFFERENTIAL  Tomorrow morning,   R     11/09/18 1916   11/10/18 0500  Comprehensive metabolic panel  Tomorrow morning,   R     11/09/18 1916   11/10/18 0500  Magnesium  Tomorrow morning,   R     11/09/18 1916   11/09/18 1942  C-reactive protein  Daily,  R     11/09/18 1941   11/09/18 1942  Sedimentation rate  Daily,   R     11/09/18 1941   11/09/18 1942  C3 complement  Once,   STAT     11/09/18 1941   11/09/18 1942  C4 complement  Once,   STAT     11/09/18 1941   11/09/18 1942  Anti-DNA antibody, double-stranded  Once,    STAT     11/09/18 1941   11/09/18 1845  SARS Coronavirus 2 (CEPHEID- Performed in [place] hospital lab), Hosp Order  (Symptomatic Patients Labs with Precautions )  Once,   STAT     11/09/18 1845   11/09/18 1843  Rapid urine drug screen (hospital performed)  Once,   STAT     11/09/18 1844   11/09/18 1328  RPR  (STI Panel)  Once,   STAT     11/09/18 1327   11/09/18 1328  HIV antibody  (STI Panel)  Once,   STAT     11/09/18 1327   11/09/18 1214  Urinalysis, Routine w reflex microscopic  (ED Abdominal Pain)  ONCE - STAT,   STAT     11/09/18 1213          Vitals/Pain Today's Vitals   11/09/18 1430 11/09/18 1445 11/09/18 1500 11/09/18 1503  BP: 115/78 124/73 124/78   Pulse: (!) 47 (!) 48 (!) 49   Resp: 16 14 16    Temp:      TempSrc:      SpO2: 100% 100% 100%   Weight:      Height:      PainSc:    Asleep    Isolation Precautions No active isolations  Medications Medications  enoxaparin (LOVENOX) injection 40 mg (has no administration in time range)  0.9 % NaCl with KCl 20 mEq/ L  infusion (has no administration in time range)  acetaminophen (TYLENOL) tablet 650 mg (has no administration in time range)    Or  acetaminophen (TYLENOL) suppository 650 mg (has no administration in time range)  ondansetron (ZOFRAN) tablet 4 mg (has no administration in time range)    Or  ondansetron (ZOFRAN) injection 4 mg (has no administration in time range)  promethazine (PHENERGAN) injection 12.5 mg (has no administration in time range)  HYDROmorphone (DILAUDID) injection 0.5-1 mg (has no administration in time range)  famotidine (PEPCID) IVPB 20 mg premix (has no administration in time range)  HYDROmorphone (DILAUDID) injection 1 mg (1 mg Intravenous Given 11/09/18 1313)  ondansetron (ZOFRAN) injection 4 mg (4 mg Intravenous Given 11/09/18 1312)  sodium chloride 0.9 % bolus 1,000 mL (0 mLs Intravenous Stopped 11/09/18 1618)  iohexol (OMNIPAQUE) 350 MG/ML injection 100 mL (100 mLs  Intravenous Contrast Given 11/09/18 1616)  promethazine (PHENERGAN) injection 12.5 mg (12.5 mg Intravenous Given 11/09/18 1710)  potassium chloride SA (K-DUR) CR tablet 40 mEq (40 mEq Oral Given 11/09/18 1756)  haloperidol lactate (HALDOL) injection 2 mg (2 mg Intravenous Given 11/09/18 1930)  sodium chloride 0.9 % bolus 1,000 mL (1,000 mLs Intravenous New Bag/Given 11/09/18 1930)  HYDROmorphone (DILAUDID) injection 0.5 mg (0.5 mg Intravenous Given 11/09/18 1931)    Mobility walks Low fall risk   Focused Assessments Emesis   R Recommendations: See Admitting Provider Note  Report given to:   Additional Notes: N/A

## 2018-11-09 NOTE — H&P (Signed)
History and Physical    Rhonda Mcgee VOP:[id number] DOB: 1986/02/07 DOA: 11/09/2018  PCP: Patient, No Pcp Per   Patient coming from: Home   Chief Complaint: Nausea, vomiting, LUQ abdominal pain   HPI: Rhonda Mcgee is a 33 y.o. female with medical history significant for lupus, now presenting to the emergency department for evaluation of nausea, vomiting, and left upper quadrant abdominal pain.  Patient reports that she developed a tooth ache approximately 2 weeks ago, was taking 800 mg ibuprofen that she got from a family member, but as the toothache resolved, she began to develop nausea and left upper quadrant abdominal discomfort.  Over the past week, the nausea and abdominal discomfort has progressively worsened and she has been vomiting nonbloody.  She denies any fevers, chills, cough, shortness of breath, lower abdominal pain, or diarrhea.  She is prescribed Plaquenil, but reports that she has not taken it in a couple weeks at least.  She smokes marijuana just about every day, but denies any other drug use.  ED Course: Upon arrival to the ED, patient is found to be afebrile, saturating well on room air, and with remaining vitals also normal.  EKG features a sinus rhythm with rate 63 and QTc interval 428 ms.  Chest x-ray is negative for acute cardiopulmonary disease.  CT of the abdomen and pelvis is negative for acute intra-abdominal or pelvic abnormality.  CTA chest is negative for PE or other acute cardiopulmonary disease.  Chemistry panel is notable for a potassium of 3.3 and bicarbonate of 15.  CBC features a mild normocytic anemia.  Patient was treated with IV fluids, Dilaudid, Phenergan, Zofran, and Haldol in the ED.  She is unable to tolerate anything by mouth in the emergency department list are consulted for admission.  Review of Systems:  All other systems reviewed and apart from HPI, are negative.  Past Medical History:  Diagnosis Date   Chlamydia    Gonorrhea     Herpes genitalia    Lupus ([place])    Rheumatoid arthritis(714.0)     Past Surgical History:  Procedure Laterality Date   ADENOIDECTOMY     CHOLECYSTECTOMY     TONSILLECTOMY     TUBAL LIGATION       reports that she has been smoking cigarettes. She has never used smokeless tobacco. She reports current alcohol use. She reports that she does not use drugs.  Allergies  Allergen Reactions   Cephalexin Hives    History reviewed. No pertinent family history.   Prior to Admission medications   Not on File    Physical Exam: Vitals:   11/09/18 1415 11/09/18 1430 11/09/18 1445 11/09/18 1500  BP: 115/74 115/78 124/73 124/78  Pulse: (!) 47 (!) 47 (!) 48 (!) 49  Resp: _0 Temp:      TempSrc:      SpO2: 100% 100% 100% 100%  Weight:      Height:        Constitutional: NAD, calm  Eyes: PERTLA, mild periorbital edema bilaterally  ENMT: Mucous membranes are moist. Posterior pharynx clear of any exudate or lesions.   Neck: normal, supple, no masses, no thyromegaly Respiratory: no wheezing, no crackles. Normal respiratory effort. No accessory muscle use.  Cardiovascular: S1 & S2 heard, regular rate and rhythm. No extremity edema.  Abdomen: No distension, no tenderness, no masses palpated. Bowel sounds normal.  Musculoskeletal: no clubbing / cyanosis. No joint deformity upper and lower extremities.   Skin: no significant rashes,  lesions, ulcers. Warm, dry, well-perfused. Neurologic: CN 2-12 grossly intact. Sensation intact. Strength 5/5 in all 4 limbs.  Psychiatric: Alert and oriented x 3. Pleasant, cooperative.    Labs on Admission: I have personally reviewed following labs and imaging studies  CBC: Recent Labs  Lab 11/09/18 1226  WBC 5.0  NEUTROABS 1.1*  HGB 11.6*  HCT 35.9*  MCV 83.3  PLT 528   Basic Metabolic Panel: Recent Labs  Lab 11/09/18 1226  NA 137  K 3.3*  CL 111  CO2 15*  GLUCOSE 102*  BUN <5*  CREATININE 0.85  CALCIUM 8.3*    GFR: Estimated Creatinine Clearance: 108.1 mL/min (by C-G formula based on SCr of 0.85 mg/dL). Liver Function Tests: Recent Labs  Lab 11/09/18 1226  AST 16  ALT 14  ALKPHOS 64  BILITOT 0.7  PROT 6.7  ALBUMIN 3.4*   Recent Labs  Lab 11/09/18 1226  LIPASE 24   No results for input(s): AMMONIA in the last 168 hours. Coagulation Profile: No results for input(s): INR, PROTIME in the last 168 hours. Cardiac Enzymes: No results for input(s): CKTOTAL, CKMB, CKMBINDEX, TROPONINI in the last 168 hours. BNP (last 3 results) No results for input(s): PROBNP in the last 8760 hours. HbA1C: No results for input(s): HGBA1C in the last 72 hours. CBG: No results for input(s): GLUCAP in the last 168 hours. Lipid Profile: No results for input(s): CHOL, HDL, LDLCALC, TRIG, CHOLHDL, LDLDIRECT in the last 72 hours. Thyroid Function Tests: No results for input(s): TSH, T4TOTAL, FREET4, T3FREE, THYROIDAB in the last 72 hours. Anemia Panel: No results for input(s): VITAMINB12, FOLATE, FERRITIN, TIBC, IRON, RETICCTPCT in the last 72 hours. Urine analysis:    Component Value Date/Time   COLORURINE YELLOW 06/05/2018 0343   APPEARANCEUR CLEAR 06/05/2018 0343   LABSPEC 1.011 06/05/2018 0343   PHURINE 6.0 06/05/2018 0343   GLUCOSEU NEGATIVE 06/05/2018 0343   HGBUR NEGATIVE 06/05/2018 0343   BILIRUBINUR NEGATIVE 06/05/2018 0343   KETONESUR NEGATIVE 06/05/2018 0343   PROTEINUR NEGATIVE 06/05/2018 0343   UROBILINOGEN 0.2 07/27/2014 1700   NITRITE NEGATIVE 06/05/2018 0343   LEUKOCYTESUR NEGATIVE 06/05/2018 0343   Sepsis Labs: _0 (procalcitonin:4,lacticidven:4) ) Recent Results (from the past 240 hour(s))  Wet prep, genital     Status: Abnormal   Collection Time: 11/09/18  4:22 PM  Result Value Ref Range Status   Yeast Wet Prep HPF POC NONE SEEN NONE SEEN Final   Trich, Wet Prep NONE SEEN NONE SEEN Final   Clue Cells Wet Prep HPF POC NONE SEEN NONE SEEN Final   WBC, Wet Prep HPF POC  MANY (A) NONE SEEN Final   Sperm NONE SEEN  Final    Comment: Performed at [facility], [place]. [address], [place], [place] [address]     Radiological Exams on Admission: Ct Abdomen Pelvis Wo Contrast  Result Date: 11/09/2018 CLINICAL DATA:  Left-sided chest and back pain. EXAM: CT ANGIOGRAPHY CHEST CT ABDOMEN AND PELVIS WITH CONTRAST TECHNIQUE: Multidetector CT imaging of the chest was performed using the standard protocol during bolus administration of intravenous contrast. Multiplanar CT image reconstructions and MIPs were obtained to evaluate the vascular anatomy. Multidetector CT imaging of the abdomen and pelvis was performed using the standard protocol during bolus administration of intravenous contrast. CONTRAST:  117m OMNIPAQUE IOHEXOL 350 MG/ML SOLN COMPARISON:  Chest CT 08/25/2018 FINDINGS: CTA CHEST FINDINGS Cardiovascular: The heart is normal in size. No pericardial effusion. The aorta is normal in caliber. No obvious dissection. The branch  vessels are patent. The pulmonary arterial tree is well opacified. No filling defects to suggest pulmonary embolism. Mediastinum/Nodes: Prominent soft tissue in the anterior mediastinum is likely benign thymic tissue in this young patient. It measures 15 Hounsfield units and has a fairly normal triangular shape anteriorly. No change since the prior CT scan. Scattered mediastinal and hilar lymph nodes but no mass or adenopathy. The esophagus is grossly normal. Lungs/Pleura: No acute pulmonary findings. No worrisome pulmonary lesions. No pleural effusions. Musculoskeletal: No breast masses, supraclavicular or axillary adenopathy. Thyroid gland is grossly normal. The bony thorax is intact. Review of the MIP images confirms the above findings. CT ABDOMEN and PELVIS FINDINGS Hepatobiliary: No focal hepatic lesions or intrahepatic biliary dilatation. The gallbladder is surgically absent. No common bile duct dilatation. Pancreas: No mass, inflammation or ductal  dilatation. Spleen: Normal size.  No focal lesions. Adrenals/Urinary Tract: The adrenal glands and kidneys are unremarkable. The bladder is unremarkable. Stomach/Bowel: The stomach, duodenum, small bowel and colon are grossly normal without oral contrast. No acute inflammatory changes, mass lesions or obstructive findings. The terminal ileum and appendix are normal. Vascular/Lymphatic: The aorta is normal in caliber. No dissection. The branch vessels are patent. The major venous structures are patent. No mesenteric or retroperitoneal mass or adenopathy. Small scattered lymph nodes are noted. Reproductive: The uterus is retroverted.  Both ovaries are normal. Other: No pelvic mass or adenopathy. No free pelvic fluid collections. No inguinal mass or adenopathy. No abdominal wall hernia or subcutaneous lesions. Musculoskeletal: No significant bony findings. Review of the MIP images confirms the above findings. IMPRESSION: 1. No CT findings for pulmonary embolism. 2. No acute pulmonary findings. 3. Stable thymic tissue in the anterior mediastinum. 4. No acute abdominal/pelvic findings, mass lesions or adenopathy. Electronically Signed   By: Marijo Sanes M.D.   On: 11/09/2018 16:27   Ct Angio Chest Pe W And/or Wo Contrast  Result Date: 11/09/2018 CLINICAL DATA:  Left-sided chest and back pain. EXAM: CT ANGIOGRAPHY CHEST CT ABDOMEN AND PELVIS WITH CONTRAST TECHNIQUE: Multidetector CT imaging of the chest was performed using the standard protocol during bolus administration of intravenous contrast. Multiplanar CT image reconstructions and MIPs were obtained to evaluate the vascular anatomy. Multidetector CT imaging of the abdomen and pelvis was performed using the standard protocol during bolus administration of intravenous contrast. CONTRAST:  181m OMNIPAQUE IOHEXOL 350 MG/ML SOLN COMPARISON:  Chest CT 08/25/2018 FINDINGS: CTA CHEST FINDINGS Cardiovascular: The heart is normal in size. No pericardial effusion. The  aorta is normal in caliber. No obvious dissection. The branch vessels are patent. The pulmonary arterial tree is well opacified. No filling defects to suggest pulmonary embolism. Mediastinum/Nodes: Prominent soft tissue in the anterior mediastinum is likely benign thymic tissue in this young patient. It measures 15 Hounsfield units and has a fairly normal triangular shape anteriorly. No change since the prior CT scan. Scattered mediastinal and hilar lymph nodes but no mass or adenopathy. The esophagus is grossly normal. Lungs/Pleura: No acute pulmonary findings. No worrisome pulmonary lesions. No pleural effusions. Musculoskeletal: No breast masses, supraclavicular or axillary adenopathy. Thyroid gland is grossly normal. The bony thorax is intact. Review of the MIP images confirms the above findings. CT ABDOMEN and PELVIS FINDINGS Hepatobiliary: No focal hepatic lesions or intrahepatic biliary dilatation. The gallbladder is surgically absent. No common bile duct dilatation. Pancreas: No mass, inflammation or ductal dilatation. Spleen: Normal size.  No focal lesions. Adrenals/Urinary Tract: The adrenal glands and kidneys are unremarkable. The bladder is unremarkable. Stomach/Bowel: The  stomach, duodenum, small bowel and colon are grossly normal without oral contrast. No acute inflammatory changes, mass lesions or obstructive findings. The terminal ileum and appendix are normal. Vascular/Lymphatic: The aorta is normal in caliber. No dissection. The branch vessels are patent. The major venous structures are patent. No mesenteric or retroperitoneal mass or adenopathy. Small scattered lymph nodes are noted. Reproductive: The uterus is retroverted.  Both ovaries are normal. Other: No pelvic mass or adenopathy. No free pelvic fluid collections. No inguinal mass or adenopathy. No abdominal wall hernia or subcutaneous lesions. Musculoskeletal: No significant bony findings. Review of the MIP images confirms the above  findings. IMPRESSION: 1. No CT findings for pulmonary embolism. 2. No acute pulmonary findings. 3. Stable thymic tissue in the anterior mediastinum. 4. No acute abdominal/pelvic findings, mass lesions or adenopathy. Electronically Signed   By: Marijo Sanes M.D.   On: 11/09/2018 16:27   Dg Chest Portable 1 View  Result Date: 11/09/2018 CLINICAL DATA:  33 year old presenting with acute onset of chest pain, shortness of breath and generalized weakness. Current smoker. EXAM: PORTABLE CHEST 1 VIEW COMPARISON:  CTA chest 08/25/2018. Chest x-rays 05/06/2018 and earlier. FINDINGS: Suboptimal inspiration accounts for crowded bronchovascular markings, especially in the bases, and accentuates the cardiac silhouette. Taking this into account, cardiomediastinal silhouette unremarkable and unchanged. Lungs clear. Bronchovascular markings normal. Pulmonary vascularity normal. No visible pleural effusions. No pneumothorax. IMPRESSION: Suboptimal inspiration. No acute cardiopulmonary disease. Electronically Signed   By: Evangeline Dakin M.D.   On: 11/09/2018 13:27    EKG: Independently reviewed. Sinus rhythm, rate 63, QTc 428 ms.   Assessment/Plan   1. Intractable nausea & vomiting  - Presents with LUQ discomfort, nausea, and non-bloody vomiting  - CT chest/abd/pelvis negative for any acute pathology  - Improved with analgesia and antiemetics in ED but still unable to take anything by mouth  - She had been using a lot of ibuprofen for a toothache leading up to symptom-onset, also had stopped her Plaquenil recently  - Check UA, ESR, CRP, anti-dsDNA, C3, and C4  - Continue supportive care with IVF, antiemetics, analgesia, Pepcid; advance diet as she improves    2. Hypokalemia  - Mild, secondary to GI-losses, KCl added to IVF, repeat chem panel in am   3. Lupus  - She stopped Plaquenil on her own a couple weeks ago, has rheumatology follow-up scheduled for this week    PPE: Mask, face shield  DVT  prophylaxis: Lovenox  Code Status: Full  Family Communication: Discussed with patient  Consults called: None Admission status: Observation     Vianne Bulls, MD [facility] Pager [phone]  If 7PM-7AM, please contact night-coverage [email or web address] Password [facility]  11/09/2018, 7:16 PM

## 2018-11-10 DIAGNOSIS — K219 Gastro-esophageal reflux disease without esophagitis: Secondary | ICD-10-CM | POA: Diagnosis present

## 2018-11-10 DIAGNOSIS — R112 Nausea with vomiting, unspecified: Secondary | ICD-10-CM

## 2018-11-10 DIAGNOSIS — K2960 Other gastritis without bleeding: Secondary | ICD-10-CM | POA: Diagnosis present

## 2018-11-10 DIAGNOSIS — K2970 Gastritis, unspecified, without bleeding: Secondary | ICD-10-CM | POA: Insufficient documentation

## 2018-11-10 LAB — GC/CHLAMYDIA PROBE AMP (~~LOC~~) NOT AT ARMC
Chlamydia: NEGATIVE
Neisseria Gonorrhea: NEGATIVE

## 2018-11-10 LAB — MAGNESIUM: Magnesium: 1.7 mg/dL (ref 1.7–2.4)

## 2018-11-10 LAB — COMPREHENSIVE METABOLIC PANEL
ALT: 14 U/L (ref 0–44)
AST: 14 U/L — ABNORMAL LOW (ref 15–41)
Albumin: 3 g/dL — ABNORMAL LOW (ref 3.5–5.0)
Alkaline Phosphatase: 59 U/L (ref 38–126)
Anion gap: 9 (ref 5–15)
BUN: <5 mg/dL — ABNORMAL LOW (ref 6–20)
CO2: 18 mmol/L — ABNORMAL LOW (ref 22–32)
Calcium: 8.2 mg/dL — ABNORMAL LOW (ref 8.9–10.3)
Chloride: 113 mmol/L — ABNORMAL HIGH (ref 98–111)
Creatinine, Ser: 0.78 mg/dL (ref 0.44–1.00)
GFR calc Af Amer: >60 mL/min (ref >60)
GFR calc non Af Amer: >60 mL/min (ref >60)
Glucose, Bld: 75 mg/dL (ref 70–99)
Potassium: 3.6 mmol/L (ref 3.5–5.1)
Sodium: 140 mmol/L (ref 135–145)
Total Bilirubin: 1 mg/dL (ref 0.3–1.2)
Total Protein: 6 g/dL — ABNORMAL LOW (ref 6.5–8.1)

## 2018-11-10 LAB — CBC WITH DIFFERENTIAL/PLATELET
Abs Immature Granulocytes: 0 10*3/uL (ref 0.00–0.07)
Basophils Absolute: 0 10*3/uL (ref 0.0–0.1)
Basophils Relative: 1 %
Eosinophils Absolute: 0.1 10*3/uL (ref 0.0–0.5)
Eosinophils Relative: 1 %
HCT: 32.7 % — ABNORMAL LOW (ref 36.0–46.0)
Hemoglobin: 10.5 g/dL — ABNORMAL LOW (ref 12.0–15.0)
Immature Granulocytes: 0 %
Lymphocytes Relative: 63 %
Lymphs Abs: 3.1 10*3/uL (ref 0.7–4.0)
MCH: 26.6 pg (ref 26.0–34.0)
MCHC: 32.1 g/dL (ref 30.0–36.0)
MCV: 83 fL (ref 80.0–100.0)
Monocytes Absolute: 0.3 10*3/uL (ref 0.1–1.0)
Monocytes Relative: 7 %
Neutro Abs: 1.3 10*3/uL — ABNORMAL LOW (ref 1.7–7.7)
Neutrophils Relative %: 28 %
Platelets: 212 10*3/uL (ref 150–400)
RBC: 3.94 MIL/uL (ref 3.87–5.11)
RDW: 14.8 % (ref 11.5–15.5)
WBC: 4.8 10*3/uL (ref 4.0–10.5)
nRBC: 0 % (ref 0.0–0.2)

## 2018-11-10 LAB — HIV ANTIBODY (ROUTINE TESTING W REFLEX): HIV Screen 4th Generation wRfx: NONREACTIVE

## 2018-11-10 LAB — SEDIMENTATION RATE: Sed Rate: 30 mm/hr — ABNORMAL HIGH (ref 0–22)

## 2018-11-10 LAB — C-REACTIVE PROTEIN: CRP: <0.8 mg/dL (ref <1.0)

## 2018-11-10 LAB — RPR: RPR Ser Ql: NONREACTIVE

## 2018-11-10 MED ORDER — PANTOPRAZOLE SODIUM 40 MG IV SOLR
40.0000 mg | Freq: Two times a day (BID) | INTRAVENOUS | Status: DC
  Administered 2018-11-10: 40 mg via INTRAVENOUS
  Filled 2018-11-10: qty 40

## 2018-11-10 MED ORDER — OMEPRAZOLE 40 MG PO CPDR: 40.0000 mg | DELAYED_RELEASE_CAPSULE | Freq: Two times a day (BID) | ORAL | 0 refills | Status: DC

## 2018-11-10 NOTE — Progress Notes (Signed)
Nsg Discharge Note  Admit Date:  11/09/2018 Discharge date: 11/10/2018   Rhonda Mcgee to be D/C'd Home per MD order.  AVS completed.  Copy for chart, and copy for patient signed, and dated. Patient/caregiver able to verbalize understanding.  Discharge Medication: Allergies as of 11/10/2018      Reactions   Cephalexin Hives      Medication List    TAKE these medications   omeprazole 40 MG capsule Commonly known as: PriLOSEC Take 1 capsule (40 mg total) by mouth 2 (two) times a day.       Discharge Assessment: Vitals:   11/10/18 0612 11/10/18 1242  BP: (!) 108/59 108/80  Pulse: 66 (!) 54  Resp: 16 17  Temp: 98.1 F (36.7 C) 98.7 F (37.1 C)  SpO2: 100% 100%   Skin clean, dry and intact without evidence of skin break down, no evidence of skin tears noted. IV catheter discontinued intact. Site without signs and symptoms of complications - no redness or edema noted at insertion site, patient denies c/o pain - only slight tenderness at site.  Dressing with slight pressure applied.  D/c Instructions-Education: Discharge instructions given to patient/family with verbalized understanding. D/c education completed with patient/family including follow up instructions, medication list, d/c activities limitations if indicated, with other d/c instructions as indicated by MD - patient able to verbalize understanding, all questions fully answered. Patient instructed to return to ED, call 911, or call MD for any changes in condition.  Patient escorted via [place], and D/C home via taxi.  Tresa Endo, RN 11/10/2018 4:12 PM

## 2018-11-10 NOTE — Progress Notes (Signed)
CSW spoke with patient; she requested assistance finding a PCP. CSW arranged follow up appointment with [facility]. Patient reported that she has no way home and is not from this area. CSW provided taxi voucher on chart.   Percell Locus Emanuel Dowson LCSW [phone]

## 2018-11-10 NOTE — Discharge Summary (Signed)
Physician Discharge Summary  Rhonda Mcgee YOV:[id number] DOB: [birth year] DOA: 11/09/2018  PCP: Patient, No Pcp Per  Admit date: 11/09/2018 Discharge date: 11/10/2018  Admitted From: Home Disposition: Home  Recommendations for Outpatient Follow-up:  1. Follow up with PCP in 1-2 weeks 2. Please obtain BMP/CBC in one week 3. Please follow up on the following pending results:  Home Health: None Equipment/Devices: None  Discharge Condition: Stable CODE STATUS: Full code Diet recommendation: Regular, avoid coffee, soda, caffeine and spicy food.  Also avoid NSAIDs.  Subjective: Patient seen and examined this morning.  She told me that she was feeling much better than yesterday.  Some nausea and some abdominal pain but still significantly improved.  I was able to gather some more history from the patient.  She tells me that she started having abdominal pain around her rib tightness about 2 weeks ago which has been intermittent and gets worse with food.  No relieving factor.  Prior to this pain started, she was taking ibuprofen about 800 mg p.o. 4 times daily for 4 days along with 2 Advil PM.  She also drinks almost 2 L of cold tea.  Brief/Interim Summary: Rhonda Mcgee is a 33 year old female with past medical history of lupus who presented to ED on 11/09/2018 with complaint of abdominal pain for past 2 weeks.  She also had nausea and vomiting.  No diarrhea, fever, chills, chest pain or shortness of breath or any other complaint.  She started having symptoms after taking 4 days of heavy NSAID use which included 800 mg of Advil 4 times daily along with Advil PM 2 tablets daily.  She also drinks almost 2 L of cold tea.  Upon arrival to the ED, she was hemodynamically stable and afebrile.  CT abdomen and pelvis was done which was unremarkable.  Potassium was slightly low at 3.3 which was replaced.  She was admitted to hospital service with abdominal pain and nausea vomiting.  When I saw her this  morning and was able to gather more history, I suspect that she likely has GERD in addition to possible gastritis which is NSAID induced in her case.  I do not think that she has any ulcer as her hemoglobin has remained stable and she has denied any history of black stools.  I started on Protonix 40 mg and started her on full liquid diet.  We discussed about discharge plan if she were to tolerate both which she did and now she is in agreement with discharging home.  I am going to prescribe her omeprazole 40 mg twice daily for 4 weeks.  She needs to follow-up with PCP within a week.  She needs to avoid soda, coffee, tea, spicy food and NSAIDs.  She verbalized understanding that.  Discharge Diagnoses:  Principal Problem:   Intractable nausea and vomiting Active Problems:   Lupus (HCC)   Hypokalemia   GERD (gastroesophageal reflux disease)   NSAID induced gastritis    Discharge Instructions  Discharge Instructions    Discharge patient   Complete by: As directed    Discharge disposition: 01-Home or Self Care   Discharge patient date: 11/10/2018     Allergies as of 11/10/2018      Reactions   Cephalexin Hives      Medication List    TAKE these medications   omeprazole 40 MG capsule Commonly known as: PriLOSEC Take 1 capsule (40 mg total) by mouth 2 (two) times a day.      Follow-up Information  [facility] Follow up on 12/03/2018.   Why: Hospital Follow up with Dr. Chapman Fitch at 2:30pm. Contact information: [place] [address] [phone]         Allergies  Allergen Reactions  . Cephalexin Hives    Consultations: None   Procedures/Studies: Ct Abdomen Pelvis Wo Contrast  Result Date: 11/09/2018 CLINICAL DATA:  Left-sided chest and back pain. EXAM: CT ANGIOGRAPHY CHEST CT ABDOMEN AND PELVIS WITH CONTRAST TECHNIQUE: Multidetector CT imaging of the chest was performed using the standard protocol during bolus  administration of intravenous contrast. Multiplanar CT image reconstructions and MIPs were obtained to evaluate the vascular anatomy. Multidetector CT imaging of the abdomen and pelvis was performed using the standard protocol during bolus administration of intravenous contrast. CONTRAST:  1105mL OMNIPAQUE IOHEXOL 350 MG/ML SOLN COMPARISON:  Chest CT 08/25/2018 FINDINGS: CTA CHEST FINDINGS Cardiovascular: The heart is normal in size. No pericardial effusion. The aorta is normal in caliber. No obvious dissection. The branch vessels are patent. The pulmonary arterial tree is well opacified. No filling defects to suggest pulmonary embolism. Mediastinum/Nodes: Prominent soft tissue in the anterior mediastinum is likely benign thymic tissue in this young patient. It measures 15 Hounsfield units and has a fairly normal triangular shape anteriorly. No change since the prior CT scan. Scattered mediastinal and hilar lymph nodes but no mass or adenopathy. The esophagus is grossly normal. Lungs/Pleura: No acute pulmonary findings. No worrisome pulmonary lesions. No pleural effusions. Musculoskeletal: No breast masses, supraclavicular or axillary adenopathy. Thyroid gland is grossly normal. The bony thorax is intact. Review of the MIP images confirms the above findings. CT ABDOMEN and PELVIS FINDINGS Hepatobiliary: No focal hepatic lesions or intrahepatic biliary dilatation. The gallbladder is surgically absent. No common bile duct dilatation. Pancreas: No mass, inflammation or ductal dilatation. Spleen: Normal size.  No focal lesions. Adrenals/Urinary Tract: The adrenal glands and kidneys are unremarkable. The bladder is unremarkable. Stomach/Bowel: The stomach, duodenum, small bowel and colon are grossly normal without oral contrast. No acute inflammatory changes, mass lesions or obstructive findings. The terminal ileum and appendix are normal. Vascular/Lymphatic: The aorta is normal in caliber. No dissection. The branch  vessels are patent. The major venous structures are patent. No mesenteric or retroperitoneal mass or adenopathy. Small scattered lymph nodes are noted. Reproductive: The uterus is retroverted.  Both ovaries are normal. Other: No pelvic mass or adenopathy. No free pelvic fluid collections. No inguinal mass or adenopathy. No abdominal wall hernia or subcutaneous lesions. Musculoskeletal: No significant bony findings. Review of the MIP images confirms the above findings. IMPRESSION: 1. No CT findings for pulmonary embolism. 2. No acute pulmonary findings. 3. Stable thymic tissue in the anterior mediastinum. 4. No acute abdominal/pelvic findings, mass lesions or adenopathy. Electronically Signed   By: Marijo Sanes M.D.   On: 11/09/2018 16:27   Ct Angio Chest Pe W And/or Wo Contrast  Result Date: 11/09/2018 CLINICAL DATA:  Left-sided chest and back pain. EXAM: CT ANGIOGRAPHY CHEST CT ABDOMEN AND PELVIS WITH CONTRAST TECHNIQUE: Multidetector CT imaging of the chest was performed using the standard protocol during bolus administration of intravenous contrast. Multiplanar CT image reconstructions and MIPs were obtained to evaluate the vascular anatomy. Multidetector CT imaging of the abdomen and pelvis was performed using the standard protocol during bolus administration of intravenous contrast. CONTRAST:  159mL OMNIPAQUE IOHEXOL 350 MG/ML SOLN COMPARISON:  Chest CT 08/25/2018 FINDINGS: CTA CHEST FINDINGS Cardiovascular: The heart is normal in size. No pericardial effusion.  The aorta is normal in caliber. No obvious dissection. The branch vessels are patent. The pulmonary arterial tree is well opacified. No filling defects to suggest pulmonary embolism. Mediastinum/Nodes: Prominent soft tissue in the anterior mediastinum is likely benign thymic tissue in this young patient. It measures 15 Hounsfield units and has a fairly normal triangular shape anteriorly. No change since the prior CT scan. Scattered mediastinal and  hilar lymph nodes but no mass or adenopathy. The esophagus is grossly normal. Lungs/Pleura: No acute pulmonary findings. No worrisome pulmonary lesions. No pleural effusions. Musculoskeletal: No breast masses, supraclavicular or axillary adenopathy. Thyroid gland is grossly normal. The bony thorax is intact. Review of the MIP images confirms the above findings. CT ABDOMEN and PELVIS FINDINGS Hepatobiliary: No focal hepatic lesions or intrahepatic biliary dilatation. The gallbladder is surgically absent. No common bile duct dilatation. Pancreas: No mass, inflammation or ductal dilatation. Spleen: Normal size.  No focal lesions. Adrenals/Urinary Tract: The adrenal glands and kidneys are unremarkable. The bladder is unremarkable. Stomach/Bowel: The stomach, duodenum, small bowel and colon are grossly normal without oral contrast. No acute inflammatory changes, mass lesions or obstructive findings. The terminal ileum and appendix are normal. Vascular/Lymphatic: The aorta is normal in caliber. No dissection. The branch vessels are patent. The major venous structures are patent. No mesenteric or retroperitoneal mass or adenopathy. Small scattered lymph nodes are noted. Reproductive: The uterus is retroverted.  Both ovaries are normal. Other: No pelvic mass or adenopathy. No free pelvic fluid collections. No inguinal mass or adenopathy. No abdominal wall hernia or subcutaneous lesions. Musculoskeletal: No significant bony findings. Review of the MIP images confirms the above findings. IMPRESSION: 1. No CT findings for pulmonary embolism. 2. No acute pulmonary findings. 3. Stable thymic tissue in the anterior mediastinum. 4. No acute abdominal/pelvic findings, mass lesions or adenopathy. Electronically Signed   By: Marijo Sanes M.D.   On: 11/09/2018 16:27   Dg Chest Portable 1 View  Result Date: 11/09/2018 CLINICAL DATA:  33 year old presenting with acute onset of chest pain, shortness of breath and generalized  weakness. Current smoker. EXAM: PORTABLE CHEST 1 VIEW COMPARISON:  CTA chest 08/25/2018. Chest x-rays 05/06/2018 and earlier. FINDINGS: Suboptimal inspiration accounts for crowded bronchovascular markings, especially in the bases, and accentuates the cardiac silhouette. Taking this into account, cardiomediastinal silhouette unremarkable and unchanged. Lungs clear. Bronchovascular markings normal. Pulmonary vascularity normal. No visible pleural effusions. No pneumothorax. IMPRESSION: Suboptimal inspiration. No acute cardiopulmonary disease. Electronically Signed   By: Evangeline Dakin M.D.   On: 11/09/2018 13:27     Discharge Exam: Vitals:   11/10/18 0612 11/10/18 1242  BP: (!) 108/59 108/80  Pulse: 66 (!) 54  Resp: 16 17  Temp: 98.1 F (36.7 C) 98.7 F (37.1 C)  SpO2: 100% 100%   Vitals:   11/09/18 2220 11/09/18 2222 11/10/18 0612 11/10/18 1242  BP: (!) 89/57 (!) 93/52 (!) 108/59 108/80  Pulse:  (!) 51 66 (!) 54  Resp: 16 16 16 17   Temp: 98.1 F (36.7 C) 98.1 F (36.7 C) 98.1 F (36.7 C) 98.7 F (37.1 C)  TempSrc:      SpO2:  100% 100% 100%  Weight:      Height:        General: Pt is alert, awake, not in acute distress Cardiovascular: RRR, S1/S2 +, no rubs, no gallops Respiratory: CTA bilaterally, no wheezing, no rhonchi Abdominal: Soft, NT, ND, bowel sounds + Extremities: no edema, no cyanosis    The results of significant diagnostics from  this hospitalization (including imaging, microbiology, ancillary and laboratory) are listed below for reference.     Microbiology: Recent Results (from the past 240 hour(s))  Wet prep, genital     Status: Abnormal   Collection Time: 11/09/18  4:22 PM  Result Value Ref Range Status   Yeast Wet Prep HPF POC NONE SEEN NONE SEEN Final   Trich, Wet Prep NONE SEEN NONE SEEN Final   Clue Cells Wet Prep HPF POC NONE SEEN NONE SEEN Final   WBC, Wet Prep HPF POC MANY (A) NONE SEEN Final   Sperm NONE SEEN  Final    Comment: Performed at  [facility], [place]. [address] [place], [place] [address]  SARS Coronavirus 2 (CEPHEID- Performed in [place] hospital lab), Hosp Order     Status: None   Collection Time: 11/09/18  6:45 PM   Specimen: Nasopharyngeal Swab  Result Value Ref Range Status   SARS Coronavirus 2 NEGATIVE NEGATIVE Final    Comment: (NOTE) If result is NEGATIVE SARS-CoV-2 target nucleic acids are NOT DETECTED. The SARS-CoV-2 RNA is generally detectable in upper and lower  respiratory specimens during the acute phase of infection. The lowest  concentration of SARS-CoV-2 viral copies this assay can detect is 250  copies / mL. A negative result does not preclude SARS-CoV-2 infection  and should not be used as the sole basis for treatment or other  patient management decisions.  A negative result may occur with  improper specimen collection / handling, submission of specimen other  than nasopharyngeal swab, presence of viral mutation(s) within the  areas targeted by this assay, and inadequate number of viral copies  (<250 copies / mL). A negative result must be combined with clinical  observations, patient history, and epidemiological information. If result is POSITIVE SARS-CoV-2 target nucleic acids are DETECTED. The SARS-CoV-2 RNA is generally detectable in upper and lower  respiratory specimens dur ing the acute phase of infection.  Positive  results are indicative of active infection with SARS-CoV-2.  Clinical  correlation with patient history and other diagnostic information is  necessary to determine patient infection status.  Positive results do  not rule out bacterial infection or co-infection with other viruses. If result is PRESUMPTIVE POSTIVE SARS-CoV-2 nucleic acids MAY BE PRESENT.   A presumptive positive result was obtained on the submitted specimen  and confirmed on repeat testing.  While 2019 novel coronavirus  (SARS-CoV-2) nucleic acids may be present in the submitted sample  additional  confirmatory testing may be necessary for epidemiological  and / or clinical management purposes  to differentiate between  SARS-CoV-2 and other Sarbecovirus currently known to infect humans.  If clinically indicated additional testing with an alternate test  methodology [phone]) is advised. The SARS-CoV-2 RNA is generally  detectable in upper and lower respiratory sp ecimens during the acute  phase of infection. The expected result is Negative. Fact Sheet for Patients:  StrictlyIdeas.no Fact Sheet for Healthcare Providers: BankingDealers.co.za This test is not yet approved or cleared by the [place] FDA and has been authorized for detection and/or diagnosis of SARS-CoV-2 by FDA under an Emergency Use Authorization (EUA).  This EUA will remain in effect (meaning this test can be used) for the duration of the COVID-19 declaration under Section 564(b)(1) of the Act, 21 U.S.C. section 360bbb-3(b)(1), unless the authorization is terminated or revoked sooner. Performed at [facility], [place] [address], [place], [place] [address]      Labs: BNP (last 3  results) No results for input(s): BNP in the last 8760 hours. Basic Metabolic Panel: Recent Labs  Lab 11/09/18 1226 11/10/18 0131  NA 137 140  K 3.3* 3.6  CL 111 113*  CO2 15* 18*  GLUCOSE 102* 75  BUN <5* <5*  CREATININE 0.85 0.78  CALCIUM 8.3* 8.2*  MG  --  1.7   Liver Function Tests: Recent Labs  Lab 11/09/18 1226 11/10/18 0131  AST 16 14*  ALT 14 14  ALKPHOS 64 59  BILITOT 0.7 1.0  PROT 6.7 6.0*  ALBUMIN 3.4* 3.0*   Recent Labs  Lab 11/09/18 1226  LIPASE 24   No results for input(s): AMMONIA in the last 168 hours. CBC: Recent Labs  Lab 11/09/18 1226 11/10/18 0131  WBC 5.0 4.8  NEUTROABS 1.1* 1.3*  HGB 11.6* 10.5*  HCT 35.9* 32.7*  MCV 83.3 83.0  PLT 221 212   Cardiac Enzymes: No results for input(s): CKTOTAL, CKMB, CKMBINDEX, TROPONINI in the  last 168 hours. BNP: Invalid input(s): POCBNP CBG: No results for input(s): GLUCAP in the last 168 hours. D-Dimer Recent Labs    11/09/18 1417  DDIMER 1.17*   Hgb A1c No results for input(s): HGBA1C in the last 72 hours. Lipid Profile No results for input(s): CHOL, HDL, LDLCALC, TRIG, CHOLHDL, LDLDIRECT in the last 72 hours. Thyroid function studies No results for input(s): TSH, T4TOTAL, T3FREE, THYROIDAB in the last 72 hours.  Invalid input(s): FREET3 Anemia work up No results for input(s): VITAMINB12, FOLATE, FERRITIN, TIBC, IRON, RETICCTPCT in the last 72 hours. Urinalysis    Component Value Date/Time   COLORURINE YELLOW 06/05/2018 0343   APPEARANCEUR CLEAR 06/05/2018 0343   LABSPEC 1.011 06/05/2018 0343   PHURINE 6.0 06/05/2018 0343   GLUCOSEU NEGATIVE 06/05/2018 0343   HGBUR NEGATIVE 06/05/2018 0343   BILIRUBINUR NEGATIVE 06/05/2018 0343   KETONESUR NEGATIVE 06/05/2018 0343   PROTEINUR NEGATIVE 06/05/2018 0343   UROBILINOGEN 0.2 07/27/2014 1700   NITRITE NEGATIVE 06/05/2018 0343   LEUKOCYTESUR NEGATIVE 06/05/2018 0343   Sepsis Labs Invalid input(s): PROCALCITONIN,  WBC,  LACTICIDVEN Microbiology Recent Results (from the past 240 hour(s))  Wet prep, genital     Status: Abnormal   Collection Time: 11/09/18  4:22 PM  Result Value Ref Range Status   Yeast Wet Prep HPF POC NONE SEEN NONE SEEN Final   Trich, Wet Prep NONE SEEN NONE SEEN Final   Clue Cells Wet Prep HPF POC NONE SEEN NONE SEEN Final   WBC, Wet Prep HPF POC MANY (A) NONE SEEN Final   Sperm NONE SEEN  Final    Comment: Performed at [facility], [place] [address], [place], [place] [address]  SARS Coronavirus 2 (CEPHEID- Performed in [place] [facility] hospital lab), Hosp Order     Status: None   Collection Time: 11/09/18  6:45 PM   Specimen: Nasopharyngeal Swab  Result Value Ref Range Status   SARS Coronavirus 2 NEGATIVE NEGATIVE Final    Comment: (NOTE) If result is NEGATIVE SARS-CoV-2 target nucleic  acids are NOT DETECTED. The SARS-CoV-2 RNA is generally detectable in upper and lower  respiratory specimens during the acute phase of infection. The lowest  concentration of SARS-CoV-2 viral copies this assay can detect is 250  copies / mL. A negative result does not preclude SARS-CoV-2 infection  and should not be used as the sole basis for treatment or other  patient management decisions.  A negative result may occur with  improper specimen collection / handling, submission of  specimen other  than nasopharyngeal swab, presence of viral mutation(s) within the  areas targeted by this assay, and inadequate number of viral copies  (<250 copies / mL). A negative result must be combined with clinical  observations, patient history, and epidemiological information. If result is POSITIVE SARS-CoV-2 target nucleic acids are DETECTED. The SARS-CoV-2 RNA is generally detectable in upper and lower  respiratory specimens dur ing the acute phase of infection.  Positive  results are indicative of active infection with SARS-CoV-2.  Clinical  correlation with patient history and other diagnostic information is  necessary to determine patient infection status.  Positive results do  not rule out bacterial infection or co-infection with other viruses. If result is PRESUMPTIVE POSTIVE SARS-CoV-2 nucleic acids MAY BE PRESENT.   A presumptive positive result was obtained on the submitted specimen  and confirmed on repeat testing.  While 2019 novel coronavirus  (SARS-CoV-2) nucleic acids may be present in the submitted sample  additional confirmatory testing may be necessary for epidemiological  and / or clinical management purposes  to differentiate between  SARS-CoV-2 and other Sarbecovirus currently known to infect humans.  If clinically indicated additional testing with an alternate test  methodology [phone]) is advised. The SARS-CoV-2 RNA is generally  detectable in upper and lower respiratory  sp ecimens during the acute  phase of infection. The expected result is Negative. Fact Sheet for Patients:  StrictlyIdeas.no Fact Sheet for Healthcare Providers: BankingDealers.co.za This test is not yet approved or cleared by the [place] FDA and has been authorized for detection and/or diagnosis of SARS-CoV-2 by FDA under an Emergency Use Authorization (EUA).  This EUA will remain in effect (meaning this test can be used) for the duration of the COVID-19 declaration under Section 564(b)(1) of the Act, 21 U.S.C. section 360bbb-3(b)(1), unless the authorization is terminated or revoked sooner. Performed at [facility], [place] [address], [place], [place] [address]      Time coordinating discharge:  30 minutes  SIGNED:   Darliss Cheney, MD  [facility] 11/10/2018, 2:46 PM Pager [phone]  If 7PM-7AM, please contact night-coverage [email or web address] Password [email or web address]

## 2018-11-10 NOTE — Discharge Instructions (Signed)
Gastritis, Adult  Gastritis is swelling (inflammation) of the stomach. Gastritis can develop quickly (acute). It can also develop slowly over time (chronic). It is important to get help for this condition. If you do not get help, your stomach can bleed, and you can get sores (ulcers) in your stomach. What are the causes? This condition may be caused by:  Germs that get to your stomach.  Drinking too much alcohol.  Medicines you are taking.  Too much acid in the stomach.  A disease of the intestines or stomach.  Stress.  An allergic reaction.  Crohn's disease.  Some cancer treatments (radiation). Sometimes the cause of this condition is not known. What are the signs or symptoms? Symptoms of this condition include:  Pain in your stomach.  A burning feeling in your stomach.  Feeling sick to your stomach (nauseous).  Throwing up (vomiting).  Feeling too full after you eat.  Weight loss.  Bad breath.  Throwing up blood.  Blood in your poop (stool). How is this diagnosed? This condition may be diagnosed with:  Your medical history and symptoms.  A physical exam.  Tests. These can include: ? Blood tests. ? Stool tests. ? A procedure to look inside your stomach (upper endoscopy). ? A test in which a sample of tissue is taken for testing (biopsy). How is this treated? Treatment for this condition depends on what caused it. You may be given:  Antibiotic medicine, if your condition was caused by germs.  H2 blockers and similar medicines, if your condition was caused by too much acid. Follow these instructions at home: Medicines  Take over-the-counter and prescription medicines only as told by your doctor.  If you were prescribed an antibiotic medicine, take it as told by your doctor. Do not stop taking it even if you start to feel better. Eating and drinking   Eat small meals often, instead of large meals.  Avoid foods and drinks that make your symptoms  worse.  Drink enough fluid to keep your pee (urine) pale yellow. Alcohol use  Do not drink alcohol if: ? Your doctor tells you not to drink. ? You are pregnant, may be pregnant, or are planning to become pregnant.  If you drink alcohol: ? Limit your use to:  0-1 drink a day for women.  0-2 drinks a day for men. ? Be aware of how much alcohol is in your drink. In the [place] one drink equals one 12 oz bottle of beer (355 mL), one 5 oz glass of wine (148 mL), or one 1 oz glass of hard liquor (44 mL). General instructions  Talk with your doctor about ways to manage stress. You can exercise or do deep breathing, meditation, or yoga.  Do not smoke or use products that have nicotine or tobacco. If you need help quitting, ask your doctor.  Keep all follow-up visits as told by your doctor. This is important. Contact a doctor if:  Your symptoms get worse.  Your symptoms go away and then come back. Get help right away if:  You throw up blood or something that looks like coffee grounds.  You have black or dark red poop.  You throw up any time you try to drink fluids.  Your stomach pain gets worse.  You have a fever.  You do not feel better after one week. Summary  Gastritis is swelling (inflammation) of the stomach.  You must get help for this condition. If you do not get help, your stomach  can bleed, and you can get sores (ulcers).  This condition is diagnosed with medical history, physical exam, or tests.  You can be treated with medicines for germs or medicines to block too much acid in your stomach. This information is not intended to replace advice given to you by your health care provider. Make sure you discuss any questions you have with your health care provider. Document Released: 10/02/2007 Document Revised: 09/02/2017 Document Reviewed: 09/02/2017 Elsevier Patient Education  2020 [employer].

## 2018-11-12 DIAGNOSIS — H01123 Discoid lupus erythematosus of right eye, unspecified eyelid: Secondary | ICD-10-CM | POA: Diagnosis not present

## 2018-11-12 DIAGNOSIS — Z79899 Other long term (current) drug therapy: Secondary | ICD-10-CM | POA: Diagnosis not present

## 2018-12-03 ENCOUNTER — Encounter: Payer: Self-pay | Admitting: Family Medicine

## 2018-12-03 ENCOUNTER — Other Ambulatory Visit: Payer: Self-pay

## 2018-12-03 ENCOUNTER — Ambulatory Visit: Payer: Medicaid Other | Attending: Family Medicine | Admitting: Family Medicine

## 2018-12-03 VITALS — BP 127/80 | HR 94 | Temp 98.6°F | Ht 65.0 in | Wt 203.4 lb

## 2018-12-03 DIAGNOSIS — K2970 Gastritis, unspecified, without bleeding: Secondary | ICD-10-CM | POA: Insufficient documentation

## 2018-12-03 DIAGNOSIS — K2960 Other gastritis without bleeding: Secondary | ICD-10-CM

## 2018-12-03 DIAGNOSIS — E876 Hypokalemia: Secondary | ICD-10-CM | POA: Diagnosis not present

## 2018-12-03 DIAGNOSIS — T39395A Adverse effect of other nonsteroidal anti-inflammatory drugs [NSAID], initial encounter: Secondary | ICD-10-CM | POA: Insufficient documentation

## 2018-12-03 DIAGNOSIS — Z8249 Family history of ischemic heart disease and other diseases of the circulatory system: Secondary | ICD-10-CM | POA: Diagnosis not present

## 2018-12-03 DIAGNOSIS — Z09 Encounter for follow-up examination after completed treatment for conditions other than malignant neoplasm: Secondary | ICD-10-CM

## 2018-12-03 DIAGNOSIS — IMO0002 Reserved for concepts with insufficient information to code with codable children: Secondary | ICD-10-CM

## 2018-12-03 DIAGNOSIS — F17210 Nicotine dependence, cigarettes, uncomplicated: Secondary | ICD-10-CM | POA: Insufficient documentation

## 2018-12-03 DIAGNOSIS — Z833 Family history of diabetes mellitus: Secondary | ICD-10-CM | POA: Insufficient documentation

## 2018-12-03 DIAGNOSIS — K219 Gastro-esophageal reflux disease without esophagitis: Secondary | ICD-10-CM

## 2018-12-03 DIAGNOSIS — R350 Frequency of micturition: Secondary | ICD-10-CM | POA: Diagnosis not present

## 2018-12-03 DIAGNOSIS — M069 Rheumatoid arthritis, unspecified: Secondary | ICD-10-CM | POA: Insufficient documentation

## 2018-12-03 DIAGNOSIS — R112 Nausea with vomiting, unspecified: Secondary | ICD-10-CM

## 2018-12-03 DIAGNOSIS — K047 Periapical abscess without sinus: Secondary | ICD-10-CM

## 2018-12-03 DIAGNOSIS — R102 Pelvic and perineal pain: Secondary | ICD-10-CM

## 2018-12-03 DIAGNOSIS — G8929 Other chronic pain: Secondary | ICD-10-CM | POA: Diagnosis not present

## 2018-12-03 DIAGNOSIS — M329 Systemic lupus erythematosus, unspecified: Secondary | ICD-10-CM

## 2018-12-03 MED ORDER — OMEPRAZOLE 40 MG PO CPDR: 40.0000 mg | DELAYED_RELEASE_CAPSULE | Freq: Two times a day (BID) | ORAL | 0 refills | Status: DC

## 2018-12-03 MED ORDER — AMOXICILLIN-POT CLAVULANATE 500-125 MG PO TABS: 1.0000 | ORAL_TABLET | Freq: Two times a day (BID) | ORAL | 0 refills | Status: AC

## 2018-12-03 MED ORDER — ONDANSETRON HCL 4 MG PO TABS: 4.0000 mg | ORAL_TABLET | Freq: Three times a day (TID) | ORAL | 0 refills | Status: DC | PRN

## 2018-12-03 MED FILL — AMOX-CLAV 500-125 MG TABLET: 500-125 | 7 days supply | Qty: 14 | Fill #0

## 2018-12-03 MED FILL — ONDANSETRON HCL 4 MG TABLET: 4 | 7 days supply | Qty: 20 | Fill #0

## 2018-12-03 MED FILL — OMEPRAZOLE DR 40 MG CAPSULE: 40 | 30 days supply | Qty: 60 | Fill #0

## 2018-12-03 NOTE — Patient Instructions (Signed)
Gastritis, Adult  Gastritis is swelling (inflammation) of the stomach. Gastritis can develop quickly (acute). It can also develop slowly over time (chronic). It is important to get help for this condition. If you do not get help, your stomach can bleed, and you can get sores (ulcers) in your stomach. What are the causes? This condition may be caused by:  Germs that get to your stomach.  Drinking too much alcohol.  Medicines you are taking.  Too much acid in the stomach.  A disease of the intestines or stomach.  Stress.  An allergic reaction.  Crohn's disease.  Some cancer treatments (radiation). Sometimes the cause of this condition is not known. What are the signs or symptoms? Symptoms of this condition include:  Pain in your stomach.  A burning feeling in your stomach.  Feeling sick to your stomach (nauseous).  Throwing up (vomiting).  Feeling too full after you eat.  Weight loss.  Bad breath.  Throwing up blood.  Blood in your poop (stool). How is this diagnosed? This condition may be diagnosed with:  Your medical history and symptoms.  A physical exam.  Tests. These can include: ? Blood tests. ? Stool tests. ? A procedure to look inside your stomach (upper endoscopy). ? A test in which a sample of tissue is taken for testing (biopsy). How is this treated? Treatment for this condition depends on what caused it. You may be given:  Antibiotic medicine, if your condition was caused by germs.  H2 blockers and similar medicines, if your condition was caused by too much acid. Follow these instructions at home: Medicines  Take over-the-counter and prescription medicines only as told by your doctor.  If you were prescribed an antibiotic medicine, take it as told by your doctor. Do not stop taking it even if you start to feel better. Eating and drinking   Eat small meals often, instead of large meals.  Avoid foods and drinks that make your symptoms  worse.  Drink enough fluid to keep your pee (urine) pale yellow. Alcohol use  Do not drink alcohol if: ? Your doctor tells you not to drink. ? You are pregnant, may be pregnant, or are planning to become pregnant.  If you drink alcohol: ? Limit your use to:  0-1 drink a day for women.  0-2 drinks a day for men. ? Be aware of how much alcohol is in your drink. In the [place] one drink equals one 12 oz bottle of beer (355 mL), one 5 oz glass of wine (148 mL), or one 1 oz glass of hard liquor (44 mL). General instructions  Talk with your doctor about ways to manage stress. You can exercise or do deep breathing, meditation, or yoga.  Do not smoke or use products that have nicotine or tobacco. If you need help quitting, ask your doctor.  Keep all follow-up visits as told by your doctor. This is important. Contact a doctor if:  Your symptoms get worse.  Your symptoms go away and then come back. Get help right away if:  You throw up blood or something that looks like coffee grounds.  You have black or dark red poop.  You throw up any time you try to drink fluids.  Your stomach pain gets worse.  You have a fever.  You do not feel better after one week. Summary  Gastritis is swelling (inflammation) of the stomach.  You must get help for this condition. If you do not get help, your stomach   can bleed, and you can get sores (ulcers).  This condition is diagnosed with medical history, physical exam, or tests.  You can be treated with medicines for germs or medicines to block too much acid in your stomach. This information is not intended to replace advice given to you by your health care provider. Make sure you discuss any questions you have with your health care provider. Document Released: 10/02/2007 Document Revised: 09/02/2017 Document Reviewed: 09/02/2017 Elsevier Patient Education  2020 [place] for Gastroesophageal Reflux Disease, Adult When you  have gastroesophageal reflux disease (GERD), the foods you eat and your eating habits are very important. Choosing the right foods can help ease your discomfort. Think about working with a nutrition specialist (dietitian) to help you make good choices. What are tips for following this plan?  Meals  Choose healthy foods that are low in fat, such as fruits, vegetables, whole grains, low-fat dairy products, and lean meat, fish, and poultry.  Eat small meals often instead of 3 large meals a day. Eat your meals slowly, and in a place where you are relaxed. Avoid bending over or lying down until 2-3 hours after eating.  Avoid eating meals 2-3 hours before bed.  Avoid drinking a lot of liquid with meals.  Cook foods using methods other than frying. Bake, grill, or broil food instead.  Avoid or limit: ? Chocolate. ? Peppermint or spearmint. ? Alcohol. ? Pepper. ? Black and decaffeinated coffee. ? Black and decaffeinated tea. ? Bubbly (carbonated) soft drinks. ? Caffeinated energy drinks and soft drinks.  Limit high-fat foods such as: ? Fatty meat or fried foods. ? Whole milk, cream, butter, or ice cream. ? Nuts and nut butters. ? Pastries, donuts, and sweets made with butter or shortening.  Avoid foods that cause symptoms. These foods may be different for everyone. Common foods that cause symptoms include: ? Tomatoes. ? Oranges, lemons, and limes. ? Peppers. ? Spicy food. ? Onions and garlic. ? Vinegar. Lifestyle  Maintain a healthy weight. Ask your doctor what weight is healthy for you. If you need to lose weight, work with your doctor to do so safely.  Exercise for at least 30 minutes for 5 or more days each week, or as told by your doctor.  Wear loose-fitting clothes.  Do not smoke. If you need help quitting, ask your doctor.  Sleep with the head of your bed higher than your feet. Use a wedge under the mattress or blocks under the bed frame to raise the head of the bed.  Summary  When you have gastroesophageal reflux disease (GERD), food and lifestyle choices are very important in easing your symptoms.  Eat small meals often instead of 3 large meals a day. Eat your meals slowly, and in a place where you are relaxed.  Limit high-fat foods such as fatty meat or fried foods.  Avoid bending over or lying down until 2-3 hours after eating.  Avoid peppermint and spearmint, caffeine, alcohol, and chocolate. This information is not intended to replace advice given to you by your health care provider. Make sure you discuss any questions you have with your health care provider. Document Released: 10/15/2011 Document Revised: 08/06/2018 Document Reviewed: 05/21/2016 Elsevier Patient Education  2020 [employer].

## 2018-12-03 NOTE — Progress Notes (Signed)
New Patient Office Visit  Subjective:  Patient ID: Rhonda Mcgee, female    DOB: July 28, 1985  Age: 33 y.o. MRN: [id number]  CC:  Chief Complaint  Patient presents with  . Hospitalization Follow-up  . New Patient (Initial Visit)  . Establish Care   Hospital: [facility] Dates of Admission: 11/09/2018-11/10/2018 (28 hours) Diagnosis: Principal problem: Intractable nausea and vomiting; Active problems: Lupus, Hypokalemia, GERD, NSAID induced gastritis    HPI Rhonda Mcgee presents for follow-up of recent hospitalization and to establish medical care. She reports a diagnosis of Lupus for which she is currently on Plaquenil by her rheumatologist. She is also having issues with a toothache. Prior to her recent hospitalization she was having issues with increased tooth pain and had been taking 800 mg ibuprofen that was given to her by a family member as well as taking Advil PM.  Her tooth pain improved however she started having issues with nausea/vomiting and pain in her mid to left upper abdomen for which she went to the emergency department on 11/09/2018.  Patient states that she was told that her abdominal pain was related to taking too much ibuprofen for her tooth pain which caused her stomach to be irritated.  Patient states that she was given fluids during her hospitalization as well as started on medicine for acid reflux and she now feels better.  She would like to have an antibiotic as she is starting to have some recurrence of her tooth pain but she does have an upcoming dental appointment.        She feels much better than she did during her recent hospitalization.  She denies any current abdominal pain.  She has had no blood in the stool and no black stools.  She has felt as if she has had some increased frequency of urination.  No burning with urination.  She denies increased thirst.  Patient also would like to be referred to GYN due to issues with recurrent pelvic  pain.  Patient also currently has inflammation of her eyelids and she believes that she developed an infection after wearing eyeglass extensions.  She is not sure if she had a reaction to the glue.  Past Medical History:  Diagnosis Date  . Chlamydia   . Gonorrhea   . Herpes genitalia   . Lupus ([place])   . Rheumatoid arthritis(714.0)     Past Surgical History:  Procedure Laterality Date  . ADENOIDECTOMY    . CHOLECYSTECTOMY    . GALLBLADDER SURGERY    . TONSILLECTOMY    . TUBAL LIGATION      Family History  Adopted: Yes  Problem Relation Age of Onset  . Diabetes Mother   . Hypertension Mother   . Diabetes Sister   . Hypertension Sister   . Lupus Maternal Grandmother     Social History   Socioeconomic History  . Marital status: Legally Separated    Spouse name: Not on file  . Number of children: Not on file  . Years of education: Not on file  . Highest education level: Not on file  Occupational History  . Not on file  Social Needs  . Financial resource strain: Not on file  . Food insecurity    Worry: Not on file    Inability: Not on file  . Transportation needs    Medical: Not on file    Non-medical: Not on file  Tobacco Use  . Smoking status: Current Some Day Smoker  Types: Cigarettes  . Smokeless tobacco: Never Used  Substance and Sexual Activity  . Alcohol use: Yes    Comment: occasional  . Drug use: No  . Sexual activity: Yes    Birth control/protection: None, Surgical  Lifestyle  . Physical activity    Days per week: Not on file    Minutes per session: Not on file  . Stress: Not on file  Relationships  . Social Herbalist on phone: Not on file    Gets together: Not on file    Attends religious service: Not on file    Active member of club or organization: Not on file    Attends meetings of clubs or organizations: Not on file    Relationship status: Not on file  . Intimate partner violence    Fear of current or ex partner: Not on  file    Emotionally abused: Not on file    Physically abused: Not on file    Forced sexual activity: Not on file  Other Topics Concern  . Not on file  Social History Narrative  . Not on file    ROS Review of Systems  Constitutional: Positive for fatigue (Improved). Negative for chills and fever.  HENT: Positive for dental problem. Negative for nosebleeds, sore throat and trouble swallowing.   Eyes: Positive for itching (Itching of upper eyelids). Negative for photophobia, pain, discharge and visual disturbance.  Respiratory: Negative for cough and shortness of breath.   Cardiovascular: Negative for chest pain and palpitations.  Gastrointestinal: Negative for abdominal pain, blood in stool, constipation, diarrhea and nausea.  Endocrine: Positive for polyuria. Negative for polydipsia and polyphagia.  Genitourinary: Positive for frequency. Negative for dysuria and flank pain.  Musculoskeletal: Negative for arthralgias, back pain and joint swelling.  Neurological: Negative for dizziness and headaches.  Hematological: Negative for adenopathy. Does not bruise/bleed easily.    Objective:   Today's Vitals: BP 127/80 (BP Location: Left Arm, Patient Position: Sitting, Cuff Size: Large)   Pulse 94   Temp 98.6 F (37 C) (Oral)   Ht 5\' 5"  (1.651 m)   Wt 203 lb 6.4 oz (92.3 kg)   LMP 11/05/2018 (Exact Date)   SpO2 98%   BMI 33.85 kg/m   Physical Exam Vitals signs and nursing note reviewed.  Constitutional:      General: She is not in acute distress.    Appearance: Normal appearance. She is not ill-appearing.     Comments: Patient with some mild visible edema of the upper eyelids otherwise normal appearance and is in no acute distress  Eyes:     General:        Right eye: No discharge.        Left eye: No discharge.     Extraocular Movements: Extraocular movements intact.     Conjunctiva/sclera: Conjunctivae normal.  Neck:     Musculoskeletal: Normal range of motion and neck  supple. No muscular tenderness.     Comments: Mild left hypoglossal tenderness on the side of area of infected tooth Cardiovascular:     Rate and Rhythm: Normal rate and regular rhythm.  Pulmonary:     Effort: Pulmonary effort is normal.     Breath sounds: Normal breath sounds.  Abdominal:     General: Abdomen is flat.     Palpations: Abdomen is soft.     Tenderness: There is no abdominal tenderness. There is no right CVA tenderness, left CVA tenderness, guarding or rebound.  Musculoskeletal:  General: No swelling or tenderness.     Right lower leg: No edema.     Left lower leg: No edema.  Lymphadenopathy:     Cervical: No cervical adenopathy.  Skin:    General: Skin is warm and dry.  Neurological:     General: No focal deficit present.     Mental Status: She is alert and oriented to person, place, and time.  Psychiatric:        Mood and Affect: Mood normal.        Behavior: Behavior normal.     Assessment & Plan:  1. Gastroesophageal reflux disease, esophagitis presence not specified; 2. NSAID induced gastritis; 4. Non-intractable nausea and vomiting; Hospital discharge follow-up Importance of continuing omeprazole 40 mg twice daily in order to heal any stomach irritation associated with gastritis from overuse of nonsteroidal anti-inflammatories was discussed with the patient at today's visit.  Patient should avoid the use of nonsteroidal anti-inflammatories at this time.  Patient is given new prescription for Zofran to take as needed for nausea as well as refill of omeprazole.  Patient has been referred to gastroenterology for further evaluation.  Patient's hospitalization records, imaging and labs were reviewed and discussed with the patient at today's visit.  Patient will have BMP in follow-up of mild hypokalemia during hospitalization as her potassium level was 3.3.  CBC in follow-up of mild anemia secondary to probable NSAID induced gastritis as patient with hemoglobin of  10.5 during recent overnight hospitalization. - Ambulatory referral to Gastroenterology - ondansetron (ZOFRAN) 4 MG tablet; Take 1 tablet (4 mg total) by mouth every 8 (eight) hours as needed for nausea or vomiting.  Dispense: 20 tablet; Refill: 0 - omeprazole (PRILOSEC) 40 MG capsule; Take 1 capsule (40 mg total) by mouth 2 (two) times daily.  Dispense: 60 capsule; Refill: 0 - CBC with Differential  3. Lupus [facility]) Continue rheumatology follow-up and BMP in follow-up of medication use and to check renal status.  - Basic Metabolic Panel  5. Hypokalemia Patient with hypokalemia during her recent hospitalization and will recheck at today's visit.  - Basic Metabolic Panel  6. Tooth infection Rx for Augmentin for tooth infection and keep follow-up with dentist for further treatment.  - amoxicillin-clavulanate (AUGMENTIN) 500-125 MG tablet; Take 1 tablet (500 mg total) by mouth 2 (two) times daily for 7 days. Take after eating  Dispense: 14 tablet; Refill: 0  7. Urinary frequency Patient with complaint of urinary frequency and will check UA to look for signs of infection/cystitis as well as BMP and Hgb A1c to look for increased blood sugar level/diabetes. - Hemoglobin A1C - Urinalysis, Routine w reflex microscopic  8. Chronic pelvic pain in female Gyn referral due to patient's complaint of chronic pelvic pain - Ambulatory referral to Gynecology   Outpatient Encounter Medications as of 12/03/2018  Medication Sig  . hydroxychloroquine (PLAQUENIL) 200 MG tablet Take 200 mg by mouth daily.   Marland Kitchen omeprazole (PRILOSEC) 40 MG capsule Take 1 capsule (40 mg total) by mouth 2 (two) times a day. (Patient not taking: Reported on 12/03/2018)   No facility-administered encounter medications on file as of 12/03/2018.    An After Visit Summary was printed and given to the patient.  Follow-up: Return in about 4 months (around 04/04/2019), or if symptoms worsen or fail to improve, for Chronic issues-3 to 4  months, sooner if any worsening of symptoms.  Antony Blackbird, MD

## 2018-12-04 ENCOUNTER — Encounter: Payer: Self-pay | Admitting: *Deleted

## 2018-12-04 ENCOUNTER — Ambulatory Visit (INDEPENDENT_AMBULATORY_CARE_PROVIDER_SITE_OTHER): Payer: Medicaid Other | Admitting: Gastroenterology

## 2018-12-04 VITALS — BP 110/72 | HR 81 | Temp 98.6°F | Ht 65.0 in | Wt 201.0 lb

## 2018-12-04 DIAGNOSIS — R1110 Vomiting, unspecified: Secondary | ICD-10-CM | POA: Diagnosis not present

## 2018-12-04 DIAGNOSIS — K219 Gastro-esophageal reflux disease without esophagitis: Secondary | ICD-10-CM

## 2018-12-04 DIAGNOSIS — R1013 Epigastric pain: Secondary | ICD-10-CM | POA: Insufficient documentation

## 2018-12-04 LAB — CBC WITH DIFFERENTIAL/PLATELET
Basophils Absolute: 0.1 x10E3/uL (ref 0.0–0.2)
Basos: 1 %
EOS (ABSOLUTE): 0.1 x10E3/uL (ref 0.0–0.4)
Eos: 2 %
Hematocrit: 36.4 % (ref 34.0–46.6)
Hemoglobin: 11.6 g/dL (ref 11.1–15.9)
Immature Grans (Abs): 0 x10E3/uL (ref 0.0–0.1)
Immature Granulocytes: 0 %
Lymphocytes Absolute: 3.2 x10E3/uL — ABNORMAL HIGH (ref 0.7–3.1)
Lymphs: 56 %
MCH: 26.6 pg (ref 26.6–33.0)
MCHC: 31.9 g/dL (ref 31.5–35.7)
MCV: 84 fL (ref 79–97)
Monocytes Absolute: 0.6 x10E3/uL (ref 0.1–0.9)
Monocytes: 10 %
Neutrophils Absolute: 1.8 x10E3/uL (ref 1.4–7.0)
Neutrophils: 31 %
Platelets: 290 x10E3/uL (ref 150–450)
RBC: 4.36 x10E6/uL (ref 3.77–5.28)
RDW: 14.7 % (ref 11.7–15.4)
WBC: 5.7 x10E3/uL (ref 3.4–10.8)

## 2018-12-04 LAB — URINALYSIS, ROUTINE W REFLEX MICROSCOPIC
Bilirubin, UA: NEGATIVE
Glucose, UA: NEGATIVE
Ketones, UA: NEGATIVE
Leukocytes,UA: NEGATIVE
Nitrite, UA: NEGATIVE
Protein,UA: NEGATIVE
RBC, UA: NEGATIVE
Specific Gravity, UA: 1.025 (ref 1.005–1.030)
Urobilinogen, Ur: 0.2 mg/dL (ref 0.2–1.0)
pH, UA: 6 (ref 5.0–7.5)

## 2018-12-04 LAB — BASIC METABOLIC PANEL WITH GFR
BUN/Creatinine Ratio: 8 — ABNORMAL LOW (ref 9–23)
BUN: 7 mg/dL (ref 6–20)
CO2: 21 mmol/L (ref 20–29)
Calcium: 9.1 mg/dL (ref 8.7–10.2)
Chloride: 106 mmol/L (ref 96–106)
Creatinine, Ser: 0.84 mg/dL (ref 0.57–1.00)
GFR calc Af Amer: 107 mL/min/1.73
GFR calc non Af Amer: 93 mL/min/1.73
Glucose: 76 mg/dL (ref 65–99)
Potassium: 3.6 mmol/L (ref 3.5–5.2)
Sodium: 142 mmol/L (ref 134–144)

## 2018-12-04 LAB — HEMOGLOBIN A1C
Est. average glucose Bld gHb Est-mCnc: 103 mg/dL
Hgb A1c MFr Bld: 5.2 % (ref 4.8–5.6)

## 2018-12-04 NOTE — Progress Notes (Signed)
12/04/2018 Rhonda Mcgee [id number] 1985/11/27   HISTORY OF PRESENT ILLNESS: This is a 33 year old female who is been referred here by Dr. Antony Blackbird, for evaluation regarding GERD, epigastric abdominal pain, and daily episodes of vomiting.  The patient tells me that she has been having these issues for several months, maybe close to a year.  She tells me that she vomits on a daily basis that usually consists of mostly yellow acid type material first thing in the morning.  She describes epigastric abdominal pain as burning.  She admits that she had previously been taking multiple ibuprofen on a regular basis and has since discontinued that, but says that a lot of the symptoms were present prior to even taking the NSAIDs.  She was recently started omeprazole 40 mg twice daily.  So far has not noticed any change in her symptoms with that.  Denies any dysphasia.  She says that she eats very little because she feels that it will make her sick.  In July she had a CT scan of the abdomen and pelvis without contrast.  This was unremarkable.  CBC, LFTs, and lipase within normal limits.  Does not have a gallbladder.   Past Medical History:  Diagnosis Date  . Chlamydia   . GERD (gastroesophageal reflux disease)   . Gonorrhea   . Herpes genitalia   . Lupus ([place])   . Rheumatoid arthritis(714.0)    Past Surgical History:  Procedure Laterality Date  . ADENOIDECTOMY    . CHOLECYSTECTOMY    . GALLBLADDER SURGERY    . TONSILLECTOMY    . TUBAL LIGATION      reports that she has been smoking cigarettes. She has never used smokeless tobacco. She reports current alcohol use. She reports that she does not use drugs. family history includes Diabetes in her mother and sister; Hypertension in her mother and sister; Lupus in her maternal grandmother. She was adopted. Allergies  Allergen Reactions  . Cephalexin Hives      Outpatient Encounter Medications as of 12/04/2018  Medication Sig  .  amoxicillin-clavulanate (AUGMENTIN) 500-125 MG tablet Take 1 tablet (500 mg total) by mouth 2 (two) times daily for 7 days. Take after eating  . hydroxychloroquine (PLAQUENIL) 200 MG tablet Take 200 mg by mouth daily.   Marland Kitchen omeprazole (PRILOSEC) 40 MG capsule Take 1 capsule (40 mg total) by mouth 2 (two) times daily.  . ondansetron (ZOFRAN) 4 MG tablet Take 1 tablet (4 mg total) by mouth every 8 (eight) hours as needed for nausea or vomiting.   No facility-administered encounter medications on file as of 12/04/2018.      REVIEW OF SYSTEMS  : All other systems reviewed and negative except where noted in the History of Present Illness.   PHYSICAL EXAM: BP 110/72   Pulse 81   Temp 98.6 F (37 C)   Ht 5\' 5"  (1.651 m)   Wt 201 lb (91.2 kg)   LMP 11/05/2018 (Exact Date)   BMI 33.45 kg/m  General: Well developed black female in no acute distress Head: Normocephalic and atraumatic Eyes:  Sclerae anicteric, conjunctiva pink. Ears: Normal auditory acuity Lungs: Clear throughout to auscultation; no increased WOB. Heart: Regular rate and rhythm; no M/R/G. Abdomen: Soft, non-distended.  BS present.  Epigastric TTP. Musculoskeletal: Symmetrical with no gross deformities  Skin: No lesions on visible extremities Extremities: No edema  Neurological: Alert oriented x 4, grossly non-focal Psychological:  Alert and cooperative. Normal mood and affect  ASSESSMENT AND  PLAN: *33 year old female with complaints of GERD, epigastric abdominal pain, and daily vomiting episodes.  Was previously using a lot of NSAID's.  Those have been discontinued.  Recently started on omeprazole 40 mg twice daily so she will continue that.  We will schedule for EGD with Dr. Rush Landmark to rule out ulcer versus esophagitis, etc.  **The risks, benefits, and alternatives to EGD were discussed with the patient and she consents to proceed.   CC:  Antony Blackbird, MD

## 2018-12-04 NOTE — Patient Instructions (Signed)
You have been scheduled for an endoscopy. Please follow written instructions given to you at your visit today. If you use inhalers (even only as needed), please bring them with you on the day of your procedure. Your physician has requested that you go to www.startemmi.com and enter the access code given to you at your visit today. This web site gives a general overview about your procedure. However, you should still follow specific instructions given to you by our office regarding your preparation for the procedure.  Continue omeprazole 40 mg twice daily (you already have this).  If you are age 35 or older, your body mass index should be between 23-30. Your Body mass index is 33.45 kg/m. If this is out of the aforementioned range listed, please consider follow up with your Primary Care Provider.  If you are age 14 or younger, your body mass index should be between 19-25. Your Body mass index is 33.45 kg/m. If this is out of the aformentioned range listed, please consider follow up with your Primary Care Provider.

## 2018-12-07 ENCOUNTER — Telehealth: Payer: Self-pay | Admitting: Gastroenterology

## 2018-12-07 NOTE — Telephone Encounter (Signed)

## 2018-12-07 NOTE — Progress Notes (Signed)
Attending Physician's Attestation   I have reviewed the chart.   I agree with the Advanced Practitioner's note, impression, and recommendations with any updates as below.    Tomothy Eddins Mansouraty, MD [facility] Advanced [facility]oscopy Office # [phone]  

## 2018-12-08 ENCOUNTER — Telehealth: Payer: Self-pay | Admitting: General Practice

## 2018-12-08 ENCOUNTER — Ambulatory Visit (AMBULATORY_SURGERY_CENTER): Payer: Medicaid Other | Admitting: Gastroenterology

## 2018-12-08 ENCOUNTER — Other Ambulatory Visit: Payer: Self-pay

## 2018-12-08 ENCOUNTER — Encounter: Payer: Self-pay | Admitting: Gastroenterology

## 2018-12-08 VITALS — BP 114/76 | HR 60 | Temp 98.7°F | Resp 12 | Ht 65.0 in | Wt 203.0 lb

## 2018-12-08 DIAGNOSIS — K219 Gastro-esophageal reflux disease without esophagitis: Secondary | ICD-10-CM

## 2018-12-08 DIAGNOSIS — K3189 Other diseases of stomach and duodenum: Secondary | ICD-10-CM

## 2018-12-08 MED ORDER — SODIUM CHLORIDE 0.9 % IV SOLN: 500.0000 mL | Freq: Once | INTRAVENOUS | Status: DC

## 2018-12-08 NOTE — Progress Notes (Signed)
Temp Kyle, VS CW 

## 2018-12-08 NOTE — Telephone Encounter (Signed)
Follow up   Pt returning call about lab results

## 2018-12-08 NOTE — Op Note (Signed)
[facility] Patient Name: Rhonda Mcgee Procedure Date: 12/08/2018 12:10 PM MRN: [id number] Endoscopist: Justice Britain , MD Age: 33 Referring MD:  Date of Birth: Jul 04, 1985 Gender: Female Account #: [id number] Procedure:                Upper GI endoscopy Indications:              Epigastric abdominal pain, Heartburn, Exclusion of                            gastric ulcer Medicines:                Monitored Anesthesia Care Procedure:                Pre-Anesthesia Assessment:                           - Prior to the procedure, a History and Physical                            was performed, and patient medications and                            allergies were reviewed. The patient's tolerance of                            previous anesthesia was also reviewed. The risks                            and benefits of the procedure and the sedation                            options and risks were discussed with the patient.                            All questions were answered, and informed consent                            was obtained. Prior Anticoagulants: The patient has                            taken no previous anticoagulant or antiplatelet                            agents except for NSAID medication. ASA Grade                            Assessment: I - A normal, healthy patient. After                            reviewing the risks and benefits, the patient was                            deemed in satisfactory condition to undergo the  procedure.                           After obtaining informed consent, the endoscope was                            passed under direct vision. Throughout the                            procedure, the patient's blood pressure, pulse, and                            oxygen saturations were monitored continuously. The                            Endoscope was introduced through the mouth, and              advanced to the second part of duodenum. The upper                            GI endoscopy was accomplished without difficulty.                            The patient tolerated the procedure. Scope In: Scope Out: Findings:                 No gross lesions were noted in the entire                            esophagus. Biopsies were taken with a cold forceps                            for histology to rule out EoE.                           The Z-line was regular and was found 36 cm from the                            incisors.                           Striped mildly erythematous mucosa without bleeding                            was found in the gastric antrum and the prepylorus.                           No other gross lesions were noted in the entire                            examined stomach. Biopsies were taken with a cold                            forceps for histology and Helicobacter pylori  testing.                           No gross lesions were noted in the duodenal bulb,                            in the first portion of the duodenum and in the                            second portion of the duodenum. Complications:            No immediate complications. Estimated Blood Loss:     Estimated blood loss was minimal. Impression:               - No gross lesions in esophagus. Biopsied for EoE.                            Z-line regular, 36 cm from the incisors.                           - Erythematous mucosa in the antrum and prepylorus.                            Biopsied for HP.                           - No gross lesions in the duodenal bulb, in the                            first portion of the duodenum and in the second                            portion of the duodenum. Recommendation:           - The patient will be observed post-procedure,                            until all discharge criteria are met.                           -  Discharge patient to home.                           - Patient has a contact number available for                            emergencies. The signs and symptoms of potential                            delayed complications were discussed with the                            patient. Return to normal activities tomorrow.  Written discharge instructions were provided to the                            patient.                           - Resume previous diet.                           - Continue PPI 40 mg twice daily for next 4-weeks                            and then decrease to once daily.                           - Observe patient's clinical course.                           - Await pathology results.                           - The findings and recommendations were discussed                            with the patient. Justice Britain, MD 12/08/2018 12:33:17 PM

## 2018-12-08 NOTE — Progress Notes (Signed)
PT taken to PACU. Monitors in place. VSS. Report given to RN. 

## 2018-12-08 NOTE — Progress Notes (Signed)
Pt's states no medical or surgical changes since previsit or office visit. 

## 2018-12-08 NOTE — Telephone Encounter (Signed)
Rhonda Mcgee, if [name] is not in the office can you contact patient regarding her lab results or let her know that [name] will contact her tomorrow?

## 2018-12-08 NOTE — Patient Instructions (Signed)
Resume previous diet.  Continue taking Omeprazole 40 mg twice a day for the next 4 weeks, then decrease to once a day.  Await pathology results.  YOU HAD AN ENDOSCOPIC PROCEDURE TODAY AT [facility]:   Refer to the procedure report that was given to you for any specific questions about what was found during the examination.  If the procedure report does not answer your questions, please call your gastroenterologist to clarify.  If you requested that your care partner not be given the details of your procedure findings, then the procedure report has been included in a sealed envelope for you to review at your convenience later.  YOU SHOULD EXPECT: Some feelings of bloating in the abdomen. Passage of more gas than usual.  Walking can help get rid of the air that was put into your GI tract during the procedure and reduce the bloating. If you had a lower endoscopy (such as a colonoscopy or flexible sigmoidoscopy) you may notice spotting of blood in your stool or on the toilet paper. If you underwent a bowel prep for your procedure, you may not have a normal bowel movement for a few days.  Please Note:  You might notice some irritation and congestion in your nose or some drainage.  This is from the oxygen used during your procedure.  There is no need for concern and it should clear up in a day or so.  SYMPTOMS TO REPORT IMMEDIATELY:   Following upper endoscopy (EGD)  Vomiting of blood or coffee ground material  New chest pain or pain under the shoulder blades  Painful or persistently difficult swallowing  New shortness of breath  Fever of 100F or higher  Black, tarry-looking stools  For urgent or emergent issues, a gastroenterologist can be reached at any hour by calling [phone].   DIET:  We do recommend a small meal at first, but then you may proceed to your regular diet.  Drink plenty of fluids but you should avoid alcoholic beverages for 24 hours.  ACTIVITY:  You  should plan to take it easy for the rest of today and you should NOT DRIVE or use heavy machinery until tomorrow (because of the sedation medicines used during the test).    FOLLOW UP: Our staff will call the number listed on your records 48-72 hours following your procedure to check on you and address any questions or concerns that you may have regarding the information given to you following your procedure. If we do not reach you, we will leave a message.  We will attempt to reach you two times.  During this call, we will ask if you have developed any symptoms of COVID 19. If you develop any symptoms (ie: fever, flu-like symptoms, shortness of breath, cough etc.) before then, please call [phone].  If you test positive for Covid 19 in the 2 weeks post procedure, please call and report this information to [place].    If any biopsies were taken you will be contacted by phone or by letter within the next 1-3 weeks.  Please call us at [phone] if you have not heard about the biopsies in 3 weeks.    SIGNATURES/CONFIDENTIALITY: You and/or your care partner have signed paperwork which will be entered into your electronic medical record.  These signatures attest to the fact that that the information above on your After Visit Summary has been reviewed and is understood.  Full responsibility of the confidentiality of this discharge information lies  with you and/or your care-partner.

## 2018-12-08 NOTE — Progress Notes (Signed)
Called to room to assist during endoscopic procedure.  Patient ID and intended procedure confirmed with present staff. Received instructions for my participation in the procedure from the performing physician.  

## 2018-12-09 NOTE — Telephone Encounter (Signed)
Informed patient with her results and she verbalized understanding.

## 2018-12-10 ENCOUNTER — Telehealth: Payer: Self-pay | Admitting: Gastroenterology

## 2018-12-10 ENCOUNTER — Telehealth: Payer: Self-pay | Admitting: *Deleted

## 2018-12-10 ENCOUNTER — Ambulatory Visit (INDEPENDENT_AMBULATORY_CARE_PROVIDER_SITE_OTHER)
Admission: RE | Admit: 2018-12-10 | Discharge: 2018-12-10 | Disposition: A | Discharge status: Home / Self Care | Payer: Medicaid Other | Source: Ambulatory Visit | Attending: Gastroenterology | Admitting: Gastroenterology

## 2018-12-10 ENCOUNTER — Telehealth: Payer: Self-pay

## 2018-12-10 ENCOUNTER — Other Ambulatory Visit: Payer: Self-pay

## 2018-12-10 DIAGNOSIS — Z9889 Other specified postprocedural states: Secondary | ICD-10-CM

## 2018-12-10 DIAGNOSIS — R1013 Epigastric pain: Secondary | ICD-10-CM | POA: Diagnosis not present

## 2018-12-10 DIAGNOSIS — R070 Pain in throat: Secondary | ICD-10-CM | POA: Diagnosis not present

## 2018-12-10 DIAGNOSIS — R079 Chest pain, unspecified: Secondary | ICD-10-CM | POA: Diagnosis not present

## 2018-12-10 NOTE — Telephone Encounter (Signed)
  Follow up Call-  Call back number 12/08/2018  Post procedure Call Back phone  # [phone]  Permission to leave phone message Yes  Some recent data might be hidden     Patient questions:   Message to call us if necessary.

## 2018-12-10 NOTE — Telephone Encounter (Signed)
  Follow up Call-  Call back number 12/08/2018  Post procedure Call Back phone  # [phone]  Permission to leave phone message Yes  Some recent data might be hidden     Patient questions:  Do you have a fever, pain , or abdominal swelling? Yes.   Pain Score  8 *  Have you tolerated food without any problems? No.  Have you been able to return to your normal activities? No.  Do you have any questions about your discharge instructions: Diet   Yes.   Medications  No. Follow up visit  No.  Do you have questions or concerns about your Care? Yes.    Actions: * If pain score is 4 or above: Physician/ provider Notified : Justice Britain, MD   Pt States that she is having 8/10 pain in her throat between Adam's apple area to the top of her sternum.  Worse when she swallows anything, and not eating or drinking much because of pain. Has been taking Tylenol 650 mg a couple times a day with no relief.  Has to prop up on 3 pillows to sleep and feels sob when laying flat..  Please advise.   Thanks,  Emilio Math RN

## 2018-12-10 NOTE — Telephone Encounter (Signed)
I called and spoke with the patient this afternoon. She describes discomfort at the back of her throat to the area of her Adam's apple. Not clear what is causing this irritation as her endoscopy was very straightforward. Esophageal biopsies should not be causing this type of discomfort. Query whether she could have esophageal hypersensitivity. I would like the patient to undergo x-rays of the neck as well as a chest x-ray so that we can evaluate for any subcutaneous emphysema that would be concerning for perforation or leak. Would like to try to get those done today versus tomorrow. She needs to have lab work as well including a CBC/BMP performed. If the patient continues to have issues or concerns will need to consider a Gastrografin upper GI swallow versus a CT neck/chest to rule out other pathology if things persist. I have asked her to begin taking Septra colas and just in the interim versus Chloraseptic to try and numb the back of her throat. She denies any fevers or chills.  Patty, please arrange these as soon as possible.  GM

## 2018-12-10 NOTE — Telephone Encounter (Signed)
Order for xray's have been ordered and pt has been advised.  She will be in this afternoon.

## 2018-12-14 ENCOUNTER — Encounter: Payer: Self-pay | Admitting: Gastroenterology

## 2018-12-20 ENCOUNTER — Encounter: Payer: Self-pay | Admitting: Family Medicine

## 2018-12-30 ENCOUNTER — Encounter: Payer: Self-pay | Admitting: Obstetrics and Gynecology

## 2018-12-30 ENCOUNTER — Other Ambulatory Visit: Payer: Self-pay

## 2018-12-30 ENCOUNTER — Other Ambulatory Visit (HOSPITAL_COMMUNITY)
Admission: RE | Admit: 2018-12-30 | Discharge: 2018-12-30 | Disposition: A | Discharge status: Home / Self Care | Payer: Medicaid Other | Source: Ambulatory Visit | Attending: Obstetrics and Gynecology | Admitting: Obstetrics and Gynecology

## 2018-12-30 ENCOUNTER — Ambulatory Visit (INDEPENDENT_AMBULATORY_CARE_PROVIDER_SITE_OTHER): Payer: Medicaid Other | Admitting: Obstetrics and Gynecology

## 2018-12-30 VITALS — BP 118/83 | HR 100 | Wt 198.6 lb

## 2018-12-30 DIAGNOSIS — Z124 Encounter for screening for malignant neoplasm of cervix: Secondary | ICD-10-CM | POA: Insufficient documentation

## 2018-12-30 DIAGNOSIS — R102 Pelvic and perineal pain: Secondary | ICD-10-CM

## 2018-12-30 DIAGNOSIS — G8929 Other chronic pain: Secondary | ICD-10-CM | POA: Diagnosis not present

## 2018-12-30 LAB — RESULTS CONSOLE HPV: CHL HPV: NEGATIVE

## 2018-12-30 NOTE — Progress Notes (Addendum)
Obstetrics and Gynecology New Patient Evaluation  Appointment Date: 12/30/2018  OBGYN Clinic: [facility]  Primary Care Provider: Antony Blackbird, MD ([place])  Referring Provider: Antony Blackbird, MD  Chief Complaint:  Chief Complaint  Patient presents with  . Pelvic Pain    History of Present Illness: Rhonda Mcgee is a 33 y.o. African-American [phone] (Patient's last menstrual period was 12/10/2018.), seen for the above chief complaint. Her past medical history is significant for Lupus, RA, h/o STDs  Patient stated for about the past year she's had lower abdominal pain that comes and goes. She was recently admitted to the hospital in July and started on plaquenil and since then states her pain is improved and continuing to improve. Her pain feels similar to menstrual cramps   No breast s/s, fevers, chills, chest pain, SOB, nausea, vomiting, current abdominal pain, dysuria, hematuria, vaginal itching, dyspareunia, constipation, blood in BMs  Review of Systems: as noted in the History of Present Illness.  Patient Active Problem List   Diagnosis Date Noted  . Abdominal pain, epigastric 12/04/2018  . GERD (gastroesophageal reflux disease) 11/10/2018  . Gastritis 11/10/2018  . NSAID induced gastritis 11/10/2018  . Intractable nausea and vomiting 11/09/2018  . Lupus ([place])   . Hypokalemia   . Vomiting     Past Medical History:  Past Medical History:  Diagnosis Date  . Chlamydia   . GERD (gastroesophageal reflux disease)   . Gonorrhea   . Herpes genitalia   . Lupus ([place])   . Rheumatoid arthritis(714.0)     Past Surgical History:  Past Surgical History:  Procedure Laterality Date  . ADENOIDECTOMY    . CHOLECYSTECTOMY    . DILATION AND CURETTAGE OF UTERUS     elective ab  . GALLBLADDER SURGERY    . TONSILLECTOMY    . TUBAL LIGATION      Past Obstetrical History:  OB History  Gravida Para Term Preterm AB Living  4        2 2   SAB TAB Ectopic Multiple Live Births  1 1     2     # Outcome Date GA Lbr Len/2nd Weight Sex Delivery Anes PTL Lv  4 Gravida           3 Gravida           2 SAB           1 TAB             Obstetric Comments  SVD x 2. SAB x 1. EAB x 1     Past Gynecological History: As per HPI. Periods: qmonth, regular, 3-5d, heavy and painful for first two days History of Pap Smear(s): Yes.   Last pap unknown, which was negativ History of STI(s): Yes.   She is currently using bilateral tubal ligation for contraception.  Patient has tried depo in the past and it caused AUB  Social History:  Social History   Socioeconomic History  . Marital status: Legally Separated    Spouse name: Not on file  . Number of children: Not on file  . Years of education: Not on file  . Highest education level: Not on file  Occupational History  . Not on file  Social Needs  . Financial resource strain: Not on file  . Food insecurity    Worry: Not on file    Inability: Not on file  . Transportation needs    Medical: Not on file  Non-medical: Not on file  Tobacco Use  . Smoking status: Current Some Day Smoker    Types: Cigarettes  . Smokeless tobacco: Never Used  . Tobacco comment: trying to quit, no smoking for last 2 weeks  Substance and Sexual Activity  . Alcohol use: Yes    Comment: occasional  . Drug use: No  . Sexual activity: Yes    Birth control/protection: None, Surgical  Lifestyle  . Physical activity    Days per week: Not on file    Minutes per session: Not on file  . Stress: Not on file  Relationships  . Social Herbalist on phone: Not on file    Gets together: Not on file    Attends religious service: Not on file    Active member of club or organization: Not on file    Attends meetings of clubs or organizations: Not on file    Relationship status: Not on file  . Intimate partner violence    Fear of current or ex partner: Not on file    Emotionally abused: Not on  file    Physically abused: Not on file    Forced sexual activity: Not on file  Other Topics Concern  . Not on file  Social History Narrative  . Not on file    Family History:  Family History  Adopted: Yes  Problem Relation Age of Onset  . Diabetes Mother   . Hypertension Mother   . Diabetes Sister   . Hypertension Sister   . Lupus Maternal Grandmother   . Esophageal cancer Neg Hx   . Rectal cancer Neg Hx   . Stomach cancer Neg Hx   . Colon cancer Neg Hx    She denies any female cancer  Medications Sallye Exton had no medications administered during this visit. Current Outpatient Medications  Medication Sig Dispense Refill  . hydroxychloroquine (PLAQUENIL) 200 MG tablet Take 200 mg by mouth daily.     Marland Kitchen omeprazole (PRILOSEC) 40 MG capsule Take 1 capsule (40 mg total) by mouth 2 (two) times daily. 60 capsule 0  . ondansetron (ZOFRAN) 4 MG tablet Take 1 tablet (4 mg total) by mouth every 8 (eight) hours as needed for nausea or vomiting. 20 tablet 0   No current facility-administered medications for this visit.     Allergies Cephalexin   Physical Exam:  BP 118/83   Pulse 100   Wt 198 lb 9.6 oz (90.1 kg)   LMP 12/10/2018   BMI 33.05 kg/m  Body mass index is 33.05 kg/m. General appearance: Well nourished, well developed female in no acute distress.  Cardiovascular: normal s1 and s2.  No murmurs, rubs or gallops. Respiratory:  Clear to auscultation bilateral. Normal respiratory effort Abdomen: positive bowel sounds and no masses, hernias; diffusely non tender to palpation, non distended Neuro/Psych:  Normal mood and affect.  Skin:  Warm and dry.  Lymphatic:  No inguinal lymphadenopathy.   Pelvic exam: is not limited by body habitus EGBUS: within normal limits, Vagina: within normal limits and with no blood or discharge in the vault, Cervix: normal appearing cervix without tenderness, discharge or lesions. Uterus:  nonenlarged and non tender and Adnexa:  normal  adnexa and no mass, fullness, tenderness Rectovaginal: deferred  Laboratory:  Negative: hiv, syphilis, gc/ct  Radiology:  CLINICAL DATA:  Left-sided chest and back pain.  EXAM: CT ANGIOGRAPHY CHEST  CT ABDOMEN AND PELVIS WITH CONTRAST  TECHNIQUE: Multidetector CT imaging of the chest was performed  using the standard protocol during bolus administration of intravenous contrast. Multiplanar CT image reconstructions and MIPs were obtained to evaluate the vascular anatomy. Multidetector CT imaging of the abdomen and pelvis was performed using the standard protocol during bolus administration of intravenous contrast.  CONTRAST:  123mL OMNIPAQUE IOHEXOL 350 MG/ML SOLN  COMPARISON:  Chest CT 08/25/2018  FINDINGS: CTA CHEST FINDINGS  Cardiovascular: The heart is normal in size. No pericardial effusion. The aorta is normal in caliber. No obvious dissection. The branch vessels are patent.  The pulmonary arterial tree is well opacified. No filling defects to suggest pulmonary embolism.  Mediastinum/Nodes: Prominent soft tissue in the anterior mediastinum is likely benign thymic tissue in this young patient. It measures 15 Hounsfield units and has a fairly normal triangular shape anteriorly. No change since the prior CT scan.  Scattered mediastinal and hilar lymph nodes but no mass or adenopathy. The esophagus is grossly normal.  Lungs/Pleura: No acute pulmonary findings. No worrisome pulmonary lesions. No pleural effusions.  Musculoskeletal: No breast masses, supraclavicular or axillary adenopathy. Thyroid gland is grossly normal.  The bony thorax is intact.  Review of the MIP images confirms the above findings.  CT ABDOMEN and PELVIS FINDINGS  Hepatobiliary: No focal hepatic lesions or intrahepatic biliary dilatation. The gallbladder is surgically absent. No common bile duct dilatation.  Pancreas: No mass, inflammation or ductal  dilatation.  Spleen: Normal size.  No focal lesions.  Adrenals/Urinary Tract: The adrenal glands and kidneys are unremarkable. The bladder is unremarkable.  Stomach/Bowel: The stomach, duodenum, small bowel and colon are grossly normal without oral contrast. No acute inflammatory changes, mass lesions or obstructive findings. The terminal ileum and appendix are normal.  Vascular/Lymphatic: The aorta is normal in caliber. No dissection. The branch vessels are patent. The major venous structures are patent. No mesenteric or retroperitoneal mass or adenopathy. Small scattered lymph nodes are noted.  Reproductive: The uterus is retroverted.  Both ovaries are normal.  Other: No pelvic mass or adenopathy. No free pelvic fluid collections. No inguinal mass or adenopathy. No abdominal wall hernia or subcutaneous lesions.  Musculoskeletal: No significant bony findings.  Review of the MIP images confirms the above findings.  IMPRESSION: 1. No CT findings for pulmonary embolism. 2. No acute pulmonary findings. 3. Stable thymic tissue in the anterior mediastinum. 4. No acute abdominal/pelvic findings, mass lesions or adenopathy.   Electronically Signed   By: Marijo Sanes M.D.   On: 11/09/2018 16:27  CLINICAL DATA:  33 year old female with lower abdominal, bilateral flank pain and bilateral adnexal pain on pelvic exam.  EXAM: TRANSABDOMINAL AND TRANSVAGINAL ULTRASOUND OF PELVIS  DOPPLER ULTRASOUND OF OVARIES  TECHNIQUE: Both transabdominal and transvaginal ultrasound examinations of the pelvis were performed. Transabdominal technique was performed for global imaging of the pelvis including uterus, ovaries, adnexal regions, and pelvic cul-de-sac.  It was necessary to proceed with endovaginal exam following the transabdominal exam to visualize the ovaries. Color and duplex Doppler ultrasound was utilized to evaluate blood flow to the ovaries.  COMPARISON:   CT Abdomen and Pelvis 10/26/2016. Pelvis ultrasound 10/26/2016.  FINDINGS: Uterus  Measurements: 9.1 x 5.0 x 6.0 centimeters. Questionable subtle 7 millimeter posterior intramural fibroid on image 52. Otherwise normal myometrium.  Endometrium  Thickness: 6 millimeters.  There is a small 2-3 millimeter cystic area within the endometrium demonstrated on image 68.  No other endometrial abnormality.  Right ovary  Measurements: 3.5 x 2.0 x 2.6 centimeters. Multiple small follicles. Normal appearance/no adnexal mass.  Left ovary  Measurements: 3.6 x 2.4 x 2.6 centimeters. Multiple small follicles. Normal appearance/no adnexal mass.  Pulsed Doppler evaluation of both ovaries demonstrates normal low-resistance arterial and venous waveforms.  Other findings  Trace simple appearing pelvic free fluid (image 59).  IMPRESSION: 1. Negative for ovarian torsion. Physiologic appearance of both ovaries. 2. Tiny 2-3 millimeters cystic area within the endometrium at the fundus is of unclear significance. Recommend correlation with pregnancy test. 3. Questionable 7 millimeter posterior intramural fibroid. 4. Small volume of physiologic appearing pelvic free fluid.   Electronically Signed   By: Genevie Ann M.D.   On: 09/29/2017 12:16  Assessment: pt doing well  Plan:  1. Cervical cancer screening - Cytology - PAP  2. Chronic pelvic pain in female I told her it's definitely possible that her disease process could be impacting her abdominal discomfort and it's encouraging that it's improving with better GI and Rheum care. She also said her pelvic exam today wasn't painful at all compared to the one in the ED. Her periods aren't bad enough to impact her ADLs during her menses. I told her if she wanted to do something about her menses that a Mirena IUD can be considered but she is fine with just exp management.   RTC 1 year and PRN  Durene Romans  MD Attending Center for [facility])

## 2018-12-31 ENCOUNTER — Encounter: Payer: Self-pay | Admitting: Obstetrics and Gynecology

## 2019-01-01 LAB — CYTOLOGY - PAP
Diagnosis: NEGATIVE
HPV: NOT DETECTED
Trichomonas: NEGATIVE

## 2019-01-03 ENCOUNTER — Encounter (HOSPITAL_COMMUNITY): Payer: Self-pay | Admitting: Emergency Medicine

## 2019-01-03 ENCOUNTER — Other Ambulatory Visit: Payer: Self-pay

## 2019-01-03 ENCOUNTER — Emergency Department (HOSPITAL_COMMUNITY)
Admission: EM | Admit: 2019-01-03 | Discharge: 2019-01-03 | Disposition: A | Discharge status: Home / Self Care | Payer: Medicaid Other | Attending: Emergency Medicine | Admitting: Emergency Medicine

## 2019-01-03 DIAGNOSIS — B373 Candidiasis of vulva and vagina: Secondary | ICD-10-CM | POA: Diagnosis not present

## 2019-01-03 DIAGNOSIS — M069 Rheumatoid arthritis, unspecified: Secondary | ICD-10-CM | POA: Diagnosis not present

## 2019-01-03 DIAGNOSIS — Z720 Tobacco use: Secondary | ICD-10-CM | POA: Insufficient documentation

## 2019-01-03 DIAGNOSIS — N760 Acute vaginitis: Secondary | ICD-10-CM | POA: Diagnosis not present

## 2019-01-03 DIAGNOSIS — B3731 Acute candidiasis of vulva and vagina: Secondary | ICD-10-CM

## 2019-01-03 DIAGNOSIS — M3210 Systemic lupus erythematosus, organ or system involvement unspecified: Secondary | ICD-10-CM | POA: Diagnosis not present

## 2019-01-03 DIAGNOSIS — L292 Pruritus vulvae: Secondary | ICD-10-CM | POA: Insufficient documentation

## 2019-01-03 DIAGNOSIS — Z711 Person with feared health complaint in whom no diagnosis is made: Secondary | ICD-10-CM

## 2019-01-03 DIAGNOSIS — Z202 Contact with and (suspected) exposure to infections with a predominantly sexual mode of transmission: Secondary | ICD-10-CM | POA: Diagnosis not present

## 2019-01-03 LAB — URINALYSIS, ROUTINE W REFLEX MICROSCOPIC
Bilirubin Urine: NEGATIVE
Glucose, UA: NEGATIVE mg/dL
Hgb urine dipstick: NEGATIVE
Ketones, ur: NEGATIVE mg/dL
Nitrite: NEGATIVE
Protein, ur: NEGATIVE mg/dL
Specific Gravity, Urine: 1.024 (ref 1.005–1.030)
pH: 5 (ref 5.0–8.0)

## 2019-01-03 LAB — RPR
RPR Ser Ql: REACTIVE — AB
RPR Ser-Titr: 1:1 {titer}

## 2019-01-03 LAB — WET PREP, GENITAL
Clue Cells Wet Prep HPF POC: NONE SEEN
Sperm: NONE SEEN
Trich, Wet Prep: NONE SEEN

## 2019-01-03 LAB — POC URINE PREG, ED: Preg Test, Ur: NEGATIVE

## 2019-01-03 MED ORDER — AZITHROMYCIN 250 MG PO TABS
1000.0000 mg | ORAL_TABLET | Freq: Once | ORAL | Status: AC
  Administered 2019-01-03: 12:00:00 1000 mg via ORAL
  Filled 2019-01-03: qty 4

## 2019-01-03 MED ORDER — FLUCONAZOLE 150 MG PO TABS
150.0000 mg | ORAL_TABLET | Freq: Once | ORAL | Status: AC
  Administered 2019-01-03: 150 mg via ORAL
  Filled 2019-01-03: qty 1

## 2019-01-03 MED ORDER — LIDOCAINE HCL (PF) 1 % IJ SOLN
INTRAMUSCULAR | Status: AC
  Administered 2019-01-03: 5 mL
  Filled 2019-01-03: qty 5

## 2019-01-03 MED ORDER — CEFTRIAXONE SODIUM 250 MG IJ SOLR
250.0000 mg | Freq: Once | INTRAMUSCULAR | Status: AC
  Administered 2019-01-03: 250 mg via INTRAMUSCULAR
  Filled 2019-01-03: qty 250

## 2019-01-03 NOTE — ED Provider Notes (Signed)
[facility] EMERGENCY DEPARTMENT Provider Note   CSN: [id number] Arrival date & time: 01/03/19  1011     History   Chief Complaint Chief Complaint  Patient presents with   Groin Swelling    HPI Rhonda Mcgee is a 33 y.o. female with history of gonorrhea, chlamydia, herpes genitalia, lupus, rheumatoid arthritis, GERD presents today for vaginal itching and swelling and concern for STD.  Patient reports that she had unprotected sexual intercourse 5 days ago.  4 days ago she developed a mild itching sensation to the inside of her vagina which has been constant since onset without aggravating or alleviating factors, she feels as if this area is somewhat swollen.  She is concerned for possible STD today and like to be tested and treated.  Additionally she thinks that symptoms may also be from a new soap that she was using as she stayed in a hotel room last week.  Patient denies fever/chills, headache, nausea/vomiting, abdominal pain, dysuria/hematuria, rectal symptoms, vaginal discharge/bleeding or any additional concerns.  Patient reports that she is due to start her menstrual cycle any day now.    HPI  Past Medical History:  Diagnosis Date   Chlamydia    GERD (gastroesophageal reflux disease)    Gonorrhea    Herpes genitalia    Lupus ([place])    Rheumatoid arthritis(714.0)     Patient Active Problem List   Diagnosis Date Noted   Abdominal pain, epigastric 12/04/2018   GERD (gastroesophageal reflux disease) 11/10/2018   Gastritis 11/10/2018   NSAID induced gastritis 11/10/2018   Intractable nausea and vomiting 11/09/2018   Lupus ([facility])    Hypokalemia    Vomiting     Past Surgical History:  Procedure Laterality Date   ADENOIDECTOMY     CHOLECYSTECTOMY     DILATION AND CURETTAGE OF UTERUS     elective ab   GALLBLADDER SURGERY     TONSILLECTOMY     TUBAL LIGATION       OB History    Gravida  4   Para      Term      Preterm       AB  2   Living  2     SAB  1   TAB  1   Ectopic      Multiple      Live Births  2        Obstetric Comments  SVD x 2. SAB x 1. EAB x 1         Home Medications    Prior to Admission medications   Medication Sig Start Date End Date Taking? Authorizing Provider  hydroxychloroquine (PLAQUENIL) 200 MG tablet Take 200 mg by mouth daily.  11/12/18   [provider]  omeprazole (PRILOSEC) 40 MG capsule Take 1 capsule (40 mg total) by mouth 2 (two) times daily. 12/03/18 01/02/19  Fulp, Cammie, MD  ondansetron (ZOFRAN) 4 MG tablet Take 1 tablet (4 mg total) by mouth every 8 (eight) hours as needed for nausea or vomiting. 12/03/18   Antony Blackbird, MD    Family History Family History  Adopted: Yes  Problem Relation Age of Onset   Diabetes Mother    Hypertension Mother    Diabetes Sister    Hypertension Sister    Lupus Maternal Grandmother    Esophageal cancer Neg Hx    Rectal cancer Neg Hx    Stomach cancer Neg Hx    Colon cancer Neg Hx  Social History Social History   Tobacco Use   Smoking status: Current Some Day Smoker    Types: Cigarettes   Smokeless tobacco: Never Used   Tobacco comment: trying to quit, no smoking for last 2 weeks  Substance Use Topics   Alcohol use: Yes    Comment: occasional   Drug use: No     Allergies   Cephalexin and Keflex [cephalexin]   Review of Systems Review of Systems Ten systems are reviewed and are negative for acute change except as noted in the HPI   Physical Exam Updated Vital Signs BP 134/88 (BP Location: Right Arm)    Pulse 86    Temp 99.1 F (37.3 C) (Oral)    Resp 18    LMP 12/10/2018    SpO2 100%   Physical Exam Constitutional:      General: She is not in acute distress.    Appearance: Normal appearance. She is well-developed. She is not ill-appearing or diaphoretic.  HENT:     Head: Normocephalic and atraumatic.     Right Ear: External ear normal.     Left Ear: External ear  normal.     Nose: Nose normal.  Eyes:     General: Vision grossly intact. Gaze aligned appropriately.     Pupils: Pupils are equal, round, and reactive to light.  Neck:     Musculoskeletal: Normal range of motion.     Trachea: Trachea and phonation normal. No tracheal deviation.  Pulmonary:     Effort: Pulmonary effort is normal. No respiratory distress.  Abdominal:     General: There is no distension.     Palpations: Abdomen is soft.     Tenderness: There is no abdominal tenderness. There is no guarding or rebound.  Genitourinary:    Comments: Exam chaperoned by Harriette Bouillon.  Pelvic exam: normal external genitalia without evidence of trauma. VULVA: normal appearing vulva with no masses, tenderness or lesion. VAGINA: normal appearing vagina with normal color and discharge, no lesions. CERVIX: normal appearing cervix without lesions, cervical motion tenderness absent, cervical os closed without purulent discharge; patient with bleeding from cervical loss, appears she has started her menstrual cycle today Wet prep and DNA probe for chlamydia and GC obtained.   ADNEXA: normal adnexa in size, nontender and no masses UTERUS: uterus is normal size, shape, consistency and nontender.  Musculoskeletal: Normal range of motion.  Skin:    General: Skin is warm and dry.  Neurological:     Mental Status: She is alert.     GCS: GCS eye subscore is 4. GCS verbal subscore is 5. GCS motor subscore is 6.     Comments: Speech is clear and goal oriented, follows commands Major Cranial nerves without deficit, no facial droop Moves extremities without ataxia, coordination intact  Psychiatric:        Behavior: Behavior normal.    ED Treatments / Results  Labs (all labs ordered are listed, but only abnormal results are displayed) Labs Reviewed  WET PREP, GENITAL - Abnormal; Notable for the following components:      Result Value   Yeast Wet Prep HPF POC PRESENT (*)    WBC, Wet Prep HPF POC FEW (*)     All other components within normal limits  URINALYSIS, ROUTINE W REFLEX MICROSCOPIC - Abnormal; Notable for the following components:   APPearance HAZY (*)    Leukocytes,Ua LARGE (*)    Bacteria, UA RARE (*)    All other components within normal limits  RPR  HIV ANTIBODY (ROUTINE TESTING W REFLEX)  POC URINE PREG, ED  GC/CHLAMYDIA PROBE AMP ([place]) NOT AT [facility]    EKG None  Radiology No results found.  Procedures Procedures (including critical care time)  Medications Ordered in ED Medications  cefTRIAXone (ROCEPHIN) injection 250 mg (250 mg Intramuscular Given 01/03/19 1227)  azithromycin (ZITHROMAX) tablet 1,000 mg (1,000 mg Oral Given 01/03/19 1226)  fluconazole (DIFLUCAN) tablet 150 mg (150 mg Oral Given 01/03/19 1226)  lidocaine (PF) (XYLOCAINE) 1 % injection (5 mLs  Given 01/03/19 1227)     Initial Impression / Assessment and Plan / ED Course  I have reviewed the triage vital signs and the nursing notes.  Pertinent labs & imaging results that were available during my care of the patient were reviewed by me and considered in my medical decision making (see chart for details).    Urine pregnancy negative Urinalysis with large leukocytes, nitrite negative, rare bacteria, 6-10 squamous cells, patient without urinary symptoms suspect contamination do not suspect UTI at this time. Wet prep with WBCs and yeast present.  Clue cells negative.  Trichomoniasis negative.  Patient with concern for STD today, has just started her menstrual cycle.  Also with yeast present on wet prep.  Based on history, physical examination and increased white blood cells on wet prep patient to be treated with azithromycin and ceftriaxone for cervicitis and concern for STD. She has allergy listed to Keflex however has received Rocephin in the past without adverse reaction.  Will observe patient after she receives Rocephin today. Pt not concerning for PID because hemodynamically stable and no cervical  motion tenderness on pelvic exam.  No trichomonas or clue cells to necessitate Flagyl treatment today. Patient to be discharged with instructions to follow up with OBGYN/PCP. Discussed importance of using protection when sexually active.  Additionally patient with yeast on wet prep, will treat yeast infection with Diflucan today.  At this time there does not appear to be any evidence of an acute emergency medical condition and the patient appears stable for discharge with appropriate outpatient follow up. Diagnosis was discussed with patient who verbalizes understanding of care plan and is agreeable to discharge. I have discussed return precautions with patient who verbalizes understanding of return precautions. Patient encouraged to follow-up with their PCP and OBGYN. All questions answered.    Patient observed for greater than 30 minutes after receiving Rocephin without adverse reaction.  She is advised to return to the ED if signs of allergic reaction occur.  Patient has been discharged in good condition.  Note: Portions of this report may have been transcribed using voice recognition software. Every effort was made to ensure accuracy; however, inadvertent computerized transcription errors may still be present. Final Clinical Impressions(s) / ED Diagnoses   Final diagnoses:  Concern about STD in female without diagnosis  Vulvovaginal candidiasis    ED Discharge Orders    None       Gari Crown 01/03/19 1301    Carmin Muskrat, MD 01/04/19 [phone]

## 2019-01-03 NOTE — ED Triage Notes (Addendum)
Pt reports having vaginal swelling, itching, and pain since Wednesday, and lower abdominal pain that starts this morning. Pt states she had sexual intercourse with her partner Tuesday night, and also used a different soap than what she normally uses.

## 2019-01-03 NOTE — ED Notes (Signed)
Patient Alert and oriented to baseline. Stable and ambulatory to baseline. Patient verbalized understanding of the discharge instructions.  Patient belongings were taken by the patient.   

## 2019-01-03 NOTE — Discharge Instructions (Signed)
You have been diagnosed today with vaginal yeast infection, concern for STD.  At this time there does not appear to be the presence of an emergent medical condition, however there is always the potential for conditions to change. Please read and follow the below instructions.  Please return to the Emergency Department immediately for any new or worsening symptoms. Please be sure to follow up with your Primary Care Provider within one week regarding your visit today; please call their office to schedule an appointment even if you are feeling better for a follow-up visit. You have been treated presumptively today for gonorrhea and chlamydia. You have been tested today for gonorrhea and chlamydia as well as HIV and syphilis. These results will be available in approximately 3 days. You may check your MyChart account for results. Please inform all sexual partners of positive results and that they should be tested and treated as well. Please wait 2 weeks and be sure that you and your partners are symptom free before returning to sexual activity. Please use protection with every sexual encounter. Follow Up: Please followup with your primary doctor in 3 days for discussion of your diagnoses and further evaluation after today's visit; if you do not have a primary care doctor use the resource guide provided to find one; Please return to the ER for worsening symptoms, high fevers or persistent vomiting. You have been treated today for vaginal yeast infection with Diflucan.  Please follow-up for recheck with your OB/GYN next week.  Get help right away if: You have a fever. Your symptoms go away and then return. Your symptoms do not get better with treatment. Your symptoms get worse. You have new symptoms. You develop blisters in or around your vagina. You have blood coming from your vagina and it is not your menstrual period. You develop pain in your abdomen. You have any new/concerning or worsening  symptoms.  Please read the additional information packets attached to your discharge summary.  Do not take your medicine if  develop an itchy rash, swelling in your mouth or lips, or difficulty breathing; call 911 and seek immediate emergency medical attention if this occurs.

## 2019-01-04 LAB — T.PALLIDUM AB, TOTAL: T Pallidum Abs: NONREACTIVE

## 2019-01-05 LAB — HIV ANTIBODY (ROUTINE TESTING W REFLEX): HIV Screen 4th Generation wRfx: NONREACTIVE

## 2019-01-06 LAB — GC/CHLAMYDIA PROBE AMP (~~LOC~~) NOT AT ARMC
Chlamydia: NEGATIVE
Neisseria Gonorrhea: NEGATIVE

## 2019-01-09 ENCOUNTER — Emergency Department (HOSPITAL_COMMUNITY)
Admission: EM | Admit: 2019-01-09 | Discharge: 2019-01-09 | Disposition: A | Discharge status: Home / Self Care | Payer: Medicaid Other | Attending: Emergency Medicine | Admitting: Emergency Medicine

## 2019-01-09 DIAGNOSIS — N898 Other specified noninflammatory disorders of vagina: Secondary | ICD-10-CM | POA: Diagnosis not present

## 2019-01-09 DIAGNOSIS — Z79899 Other long term (current) drug therapy: Secondary | ICD-10-CM | POA: Diagnosis not present

## 2019-01-09 DIAGNOSIS — F17210 Nicotine dependence, cigarettes, uncomplicated: Secondary | ICD-10-CM | POA: Insufficient documentation

## 2019-01-09 DIAGNOSIS — L292 Pruritus vulvae: Secondary | ICD-10-CM | POA: Insufficient documentation

## 2019-01-09 LAB — WET PREP, GENITAL
Clue Cells Wet Prep HPF POC: NONE SEEN
Sperm: NONE SEEN
Trich, Wet Prep: NONE SEEN
Yeast Wet Prep HPF POC: NONE SEEN

## 2019-01-09 MED ORDER — HYDROXYZINE HCL 25 MG PO TABS: 25.0000 mg | ORAL_TABLET | Freq: Four times a day (QID) | ORAL | 0 refills | Status: DC

## 2019-01-09 MED ORDER — LIDOCAINE HCL URETHRAL/MUCOSAL 2 % EX GEL
1.0000 "application " | Freq: Once | CUTANEOUS | Status: AC
  Administered 2019-01-09: 1 via TOPICAL
  Filled 2019-01-09: qty 20

## 2019-01-09 NOTE — ED Triage Notes (Signed)
Vaginal discharge that has not gotten better since she was seen recently.  Reports pain and discomfort.

## 2019-01-09 NOTE — Discharge Instructions (Addendum)
Your first wet prep today does not show any bacterial vaginosis or yeast.  Repeat wet prep collected and is pending.  You can call back later for these results, treatment will be sent to your pharmacy if needed.  Recommend follow-up with your doctor.  Given prescription for Atarax which can be taken for itching.

## 2019-01-09 NOTE — ED Notes (Signed)
Pt provided ice pack 

## 2019-01-09 NOTE — ED Provider Notes (Signed)
[facility] EMERGENCY DEPARTMENT Provider Note   CSN: [id number] Arrival date & time: 01/09/19  1107     History   Chief Complaint Chief Complaint  Patient presents with  . Vaginal Discharge    HPI Rhonda Mcgee is a 33 y.o. female.     33 year old female presents with complaint of vaginal itching.  Patient was seen in the emergency room on September 6, had a pelvic exam at that time and was positive for yeast, negative for BV.  Patient was given Diflucan, also treated prophylactically for STDs with Rocephin and Zithromax.  Patient states that she believes she had BV at that time which is why she continues to itch and is requesting treatment for this.  Patient states that she had another pelvic exam on September 8, was told everything was negative on that exam, did not have blood work done that day.  Reviewed patient's lab work from her previous ER visit, RPR is positive.  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  Patient denies any new sexual partners since that time and does not want any further treatment today.     Past Medical History:  Diagnosis Date  . Chlamydia   . GERD (gastroesophageal reflux disease)   . Gonorrhea   . Herpes genitalia   . Lupus ([place])   . Rheumatoid arthritis(714.0)     Patient Active Problem List   Diagnosis Date Noted  . Abdominal pain, epigastric 12/04/2018  . GERD (gastroesophageal reflux disease) 11/10/2018  . Gastritis 11/10/2018  . NSAID induced gastritis 11/10/2018  . Intractable nausea and vomiting 11/09/2018  . Lupus ([place])   . Hypokalemia   . Vomiting     Past Surgical History:  Procedure Laterality Date  . ADENOIDECTOMY    . CHOLECYSTECTOMY    . DILATION AND CURETTAGE OF UTERUS     elective ab  . GALLBLADDER SURGERY    . TONSILLECTOMY    . TUBAL LIGATION        OB History    Gravida  4   Para      Term      Preterm      AB  2   Living  2     SAB  1   TAB  1   Ectopic      Multiple      Live Births  2        Obstetric Comments  SVD x 2. SAB x 1. EAB x 1         Home Medications    Prior to Admission medications   Medication Sig Start Date End Date Taking? Authorizing Provider  hydroxychloroquine (PLAQUENIL) 200 MG tablet Take 200 mg by mouth daily.  11/12/18   [provider]  hydrOXYzine (ATARAX/VISTARIL) 25 MG tablet Take 1 tablet (25 mg total) by mouth every 6 (six) hours. 01/09/19   Tacy Learn, PA-C  omeprazole (PRILOSEC) 40 MG capsule Take 1 capsule (40 mg total) by mouth 2 (two) times daily. 12/03/18 01/02/19  Fulp, Cammie, MD  ondansetron (ZOFRAN) 4 MG tablet Take 1 tablet (4 mg total) by mouth every 8 (eight) hours as needed for nausea or vomiting. 12/03/18   Antony Blackbird, MD    Family History Family History  Adopted: Yes  Problem Relation Age of Onset  . Diabetes Mother   .  Hypertension Mother   . Diabetes Sister   . Hypertension Sister   . Lupus Maternal Grandmother   . Esophageal cancer Neg Hx   . Rectal cancer Neg Hx   . Stomach cancer Neg Hx   . Colon cancer Neg Hx     Social History Social History   Tobacco Use  . Smoking status: Current Some Day Smoker    Types: Cigarettes  . Smokeless tobacco: Never Used  . Tobacco comment: trying to quit, no smoking for last 2 weeks  Substance Use Topics  . Alcohol use: Yes    Comment: occasional  . Drug use: No     Allergies   Cephalexin and Keflex [cephalexin]   Review of Systems Review of Systems  Constitutional: Negative for fever.  Gastrointestinal: Negative for abdominal pain.  Genitourinary: Positive for vaginal discharge. Negative for dysuria and pelvic pain.  Skin: Negative for rash and wound.  Allergic/Immunologic: Negative for immunocompromised state.  All other systems reviewed and are negative.    Physical Exam  Updated Vital Signs BP 124/79   Pulse 80   Temp 98.4 F (36.9 C) (Oral)   Resp 16   Wt 90 kg   LMP 12/10/2018   SpO2 100%   BMI 33.02 kg/m   Physical Exam Vitals signs and nursing note reviewed. Exam conducted with a chaperone present.  Constitutional:      General: She is not in acute distress.    Appearance: She is well-developed. She is not diaphoretic.  HENT:     Head: Normocephalic and atraumatic.  Pulmonary:     Effort: Pulmonary effort is normal.  Genitourinary:    General: Normal vulva.     Vagina: No signs of injury and foreign body. Tenderness present. No vaginal discharge, erythema, bleeding or lesions.     Cervix: No discharge.     Comments: No lesions, no discharge. Labia tender bilaterally without external changes. Speculum exam done and appears normal, repeat wet prep collected. Skin:    General: Skin is warm and dry.     Findings: No erythema or rash.  Neurological:     Mental Status: She is alert and oriented to person, place, and time.  Psychiatric:        Behavior: Behavior normal.      ED Treatments / Results  Labs (all labs ordered are listed, but only abnormal results are displayed) Labs Reviewed  WET PREP, GENITAL - Abnormal; Notable for the following components:      Result Value   WBC, Wet Prep HPF POC FEW (*)    All other components within normal limits    EKG None  Radiology No results found.  Procedures Procedures (including critical care time)  Medications Ordered in ED Medications  lidocaine (XYLOCAINE) 2 % jelly 1 application (1 application Topical Given 01/09/19 1431)     Initial Impression / Assessment and Plan / ED Course  I have reviewed the triage vital signs and the nursing notes.  Pertinent labs & imaging results that were available during my care of the patient were reviewed by me and considered in my medical decision making (see chart for details).  Clinical Course as of Jan 09 1648  Sat Jan 09, 2019  1645  32yo female with vaginal itching since Tuesday. Patient was seen in the ER 6 days ago, given Diflucan for yeast infection, rocephin and zithromax. Gc/chlamydia negative, RPR reactive (history of same, has been to ID who reported false negative, advised to follow up  with her ID provider). Patient did not want to wait for pelvic exam and requested to self swab.  Self swab wet prep was positive for white cells only, negative for trichomoniasis BV, yeast.  Discussed results with patient, patient reports severe itching, she was given lidocaine jelly and an ice pack.  Patient agreeable to pelvic exam.  Pelvic exam is unremarkable, no external lesions to suggest infestation or herpes.  We collected wet prep which was discontinued after patient decided to leave.  Patient was given Vistaril for her itching and referred back to her PCP.   [LM]    Clinical Course User Index [LM] Tacy Learn, PA-C      Final Clinical Impressions(s) / ED Diagnoses   Final diagnoses:  Vaginal itching    ED Discharge Orders         Ordered    hydrOXYzine (ATARAX/VISTARIL) 25 MG tablet  Every 6 hours     01/09/19 1527           Tacy Learn, PA-C 01/09/19 1649    Malvin Johns, MD 01/09/19 2007

## 2019-01-09 NOTE — ED Notes (Signed)
Patient verbalizes understanding of discharge instructions. Opportunity for questioning and answers were provided. Armband removed by staff, pt discharged from ED. Pt did not wish to wait on test results.

## 2019-01-19 ENCOUNTER — Ambulatory Visit: Payer: Medicaid Other | Admitting: Gastroenterology

## 2019-04-12 ENCOUNTER — Other Ambulatory Visit: Payer: Self-pay

## 2019-04-12 ENCOUNTER — Encounter (HOSPITAL_COMMUNITY): Payer: Self-pay

## 2019-04-12 ENCOUNTER — Emergency Department (HOSPITAL_COMMUNITY)
Admission: EM | Admit: 2019-04-12 | Discharge: 2019-04-12 | Disposition: A | Discharge status: Home / Self Care | Payer: Medicaid Other | Attending: Emergency Medicine | Admitting: Emergency Medicine

## 2019-04-12 ENCOUNTER — Emergency Department (HOSPITAL_COMMUNITY): Payer: Medicaid Other

## 2019-04-12 DIAGNOSIS — B349 Viral infection, unspecified: Secondary | ICD-10-CM | POA: Diagnosis not present

## 2019-04-12 DIAGNOSIS — Z113 Encounter for screening for infections with a predominantly sexual mode of transmission: Secondary | ICD-10-CM | POA: Insufficient documentation

## 2019-04-12 DIAGNOSIS — R0789 Other chest pain: Secondary | ICD-10-CM | POA: Diagnosis not present

## 2019-04-12 DIAGNOSIS — Z79899 Other long term (current) drug therapy: Secondary | ICD-10-CM | POA: Insufficient documentation

## 2019-04-12 DIAGNOSIS — N838 Other noninflammatory disorders of ovary, fallopian tube and broad ligament: Secondary | ICD-10-CM | POA: Diagnosis not present

## 2019-04-12 DIAGNOSIS — F17210 Nicotine dependence, cigarettes, uncomplicated: Secondary | ICD-10-CM | POA: Insufficient documentation

## 2019-04-12 DIAGNOSIS — N898 Other specified noninflammatory disorders of vagina: Secondary | ICD-10-CM | POA: Diagnosis not present

## 2019-04-12 DIAGNOSIS — Z20828 Contact with and (suspected) exposure to other viral communicable diseases: Secondary | ICD-10-CM | POA: Insufficient documentation

## 2019-04-12 DIAGNOSIS — R05 Cough: Secondary | ICD-10-CM | POA: Diagnosis not present

## 2019-04-12 LAB — WET PREP, GENITAL
Clue Cells Wet Prep HPF POC: NONE SEEN
Sperm: NONE SEEN
Trich, Wet Prep: NONE SEEN
Yeast Wet Prep HPF POC: NONE SEEN

## 2019-04-12 LAB — COMPREHENSIVE METABOLIC PANEL
ALT: 19 U/L (ref 0–44)
AST: 19 U/L (ref 15–41)
Albumin: 3.9 g/dL (ref 3.5–5.0)
Alkaline Phosphatase: 81 U/L (ref 38–126)
Anion gap: 11 (ref 5–15)
BUN: 13 mg/dL (ref 6–20)
CO2: 20 mmol/L — ABNORMAL LOW (ref 22–32)
Calcium: 9 mg/dL (ref 8.9–10.3)
Chloride: 107 mmol/L (ref 98–111)
Creatinine, Ser: 0.97 mg/dL (ref 0.44–1.00)
GFR calc Af Amer: >60 mL/min (ref >60)
GFR calc non Af Amer: >60 mL/min (ref >60)
Glucose, Bld: 90 mg/dL (ref 70–99)
Potassium: 4.3 mmol/L (ref 3.5–5.1)
Sodium: 138 mmol/L (ref 135–145)
Total Bilirubin: 0.8 mg/dL (ref 0.3–1.2)
Total Protein: 7.3 g/dL (ref 6.5–8.1)

## 2019-04-12 LAB — CBC
HCT: 40 % (ref 36.0–46.0)
Hemoglobin: 12.8 g/dL (ref 12.0–15.0)
MCH: 26.4 pg (ref 26.0–34.0)
MCHC: 32 g/dL (ref 30.0–36.0)
MCV: 82.6 fL (ref 80.0–100.0)
Platelets: 271 10*3/uL (ref 150–400)
RBC: 4.84 MIL/uL (ref 3.87–5.11)
RDW: 16.5 % — ABNORMAL HIGH (ref 11.5–15.5)
WBC: 4.4 10*3/uL (ref 4.0–10.5)
nRBC: 0 % (ref 0.0–0.2)

## 2019-04-12 LAB — URINALYSIS, ROUTINE W REFLEX MICROSCOPIC
Bilirubin Urine: NEGATIVE
Glucose, UA: NEGATIVE mg/dL
Hgb urine dipstick: NEGATIVE
Ketones, ur: NEGATIVE mg/dL
Leukocytes,Ua: NEGATIVE
Nitrite: NEGATIVE
Protein, ur: NEGATIVE mg/dL
Specific Gravity, Urine: 1.019 (ref 1.005–1.030)
pH: 5 (ref 5.0–8.0)

## 2019-04-12 LAB — I-STAT BETA HCG BLOOD, ED (MC, WL, AP ONLY): I-stat hCG, quantitative: <5 m[IU]/mL (ref <5)

## 2019-04-12 LAB — LIPASE, BLOOD: Lipase: 31 U/L (ref 11–51)

## 2019-04-12 LAB — TROPONIN I (HIGH SENSITIVITY): Troponin I (High Sensitivity): <2 ng/L (ref <18)

## 2019-04-12 MED ORDER — AZITHROMYCIN 250 MG PO TABS
1000.0000 mg | ORAL_TABLET | Freq: Once | ORAL | Status: AC
  Administered 2019-04-12: 1000 mg via ORAL
  Filled 2019-04-12: qty 4

## 2019-04-12 MED ORDER — PROMETHAZINE HCL 25 MG PO TABS
25.0000 mg | ORAL_TABLET | Freq: Once | ORAL | Status: AC
  Administered 2019-04-12: 25 mg via ORAL
  Filled 2019-04-12: qty 1

## 2019-04-12 MED ORDER — SODIUM CHLORIDE 0.9% FLUSH: 3.0000 mL | Freq: Once | INTRAVENOUS | Status: DC

## 2019-04-12 MED ORDER — CEFTRIAXONE SODIUM 250 MG IJ SOLR
250.0000 mg | Freq: Once | INTRAMUSCULAR | Status: AC
  Administered 2019-04-12: 250 mg via INTRAMUSCULAR
  Filled 2019-04-12: qty 250

## 2019-04-12 MED ORDER — ONDANSETRON 4 MG PO TBDP
4.0000 mg | ORAL_TABLET | Freq: Once | ORAL | Status: AC
  Administered 2019-04-12: 12:00:00 4 mg via ORAL
  Filled 2019-04-12: qty 1

## 2019-04-12 NOTE — ED Notes (Signed)
Pt discharge instructions reviewed with the patient. The patient verbalized understanding of discharge instructions. Pt discharged.

## 2019-04-12 NOTE — ED Triage Notes (Signed)
Pt has multiple complaints: chest pain, cough, abd pain, n.v since Friday. Denies fever. Pt a.o, resp e/u.

## 2019-04-12 NOTE — Discharge Instructions (Addendum)
You were seen in the ER for congestion, cough, chest pain, shortness of breath, phlegm, nausea, vomiting, abdominal pain, vaginal discharge.   I suspect you have a virus.  We tested your for COVID-19 (coronavirus) infection.  It is also possible you could have other viral upper respiratory infection from another virus like influenza, rhinovirus, etc.   Other testing done today was normal. Vaginal swabs so far are negative for bacterial vaginosis, yeast, trichomonas.  STD (chlamydia and gonorrhea) swabs are pending, follow up on the results on MyChart. You were treated for these infection.  Do not have sexual encounters until symptoms improve and until you have your STD results.    COVID test results come back in 48-72 hours, sometimes sooner.  Someone will call you to notify you of results.  You can also check MyChart for formal results that will be posted.   Treatment of your illness and symptoms for now will include self-isolation, monitoring of symptoms and supportive care with over-the-counter medicines.    Stay well-hydrated. Rest. You can use over the counter medications to help with symptoms: [phone] mg acetaminophen (tylenol) every 6 hours, around the clock to help with associated fevers, sore throat, headaches, generalized body aches and malaise.  Oxymetazoline (afrin) intranasal spray once daily for no more than 3 days to help with congestion, after 3 days you can switch to another over-the-counter nasal steroid spray such as fluticasone (flonase) Allergy medication (loratadine, cetirizine, etc) and phenylephrine (sudafed) help with nasal congestion, runny nose and postnasal drip.   Dextromethorphan (Delsym) to suppress dry cough. Frequent coughing is likely causing your chest wall pain Guaifenesin (Mucinex) to help expectorate mucus and cough Wash your hands often to prevent spread.  Stay hydrated with plenty of clear fluids Rest  Phenergan for nausea as needed  Return to the ED if  symptoms are worsening or severe, there is increased work of breathing, chest pain or shortness of breath with exertion or activity, inability to tolerate fluids due to persistent vomiting despite nausea medicines, passing out, light headedness.  If your test results are POSITIVE, the following isolation requirements need to be met to return to work and resume essential activities: At least 10 days since symptom onset  72 hours of absence of fever without antifever medicine (ibuprofen, acetaminophen). A fever is temperature of 100.68F or greater. Improvement of respiratory symptoms  If your test is NEGATIVE, the following requirements need to be met to return to work and essential activities: 1. Your symptoms have improved  2.  You do not have a fever for a total of 3 days without antifever medicines (ibuprofen, acetaminophen).   3. You do not develop any other symptoms of COVID-19.  Call your job and notify them that your test result was negative to see if they will all you to return to work. Sometimes when a COVID test is done very early on in the illness, it can be false negative.  This means the test did not pick up the virus because it was too early.  If your symptoms are not improving or you develop new symptoms of COVID-19, you may need to be retested.      Infection Prevention Recommendations for Individuals Confirmed to have, or Being Evaluated for, or have symptoms of 2019 Novel Coronavirus (COVID-19) Infection Who Receive Care at Home  Individuals who are confirmed to have, or are being evaluated for, COVID-19 should follow the prevention steps below until a healthcare provider or local or state health department  says they can return to normal activities.  Stay home except to get medical care You should restrict activities outside your home, except for getting medical care. Do not go to work, school, or public areas, and do not use public transportation or taxis.  Call ahead before  visiting your doctor Before your medical appointment, call the healthcare provider and tell them that you have, or are being evaluated for, COVID-19 infection. This will help the healthcare provider's office take steps to keep other people from getting infected. Ask your healthcare provider to call the local or state health department.  Monitor your symptoms Seek prompt medical attention if your illness is worsening (e.g., difficulty breathing). Before going to your medical appointment, call the healthcare provider and tell them that you have, or are being evaluated for, COVID-19 infection. Ask your healthcare provider to call the local or state health department.  Wear a facemask You should wear a facemask that covers your nose and mouth when you are in the same room with other people and when you visit a healthcare provider. People who live with or visit you should also wear a facemask while they are in the same room with you.  Separate yourself from other people in your home As much as possible, you should stay in a different room from other people in your home. Also, you should use a separate bathroom, if available.  Avoid sharing household items You should not share dishes, drinking glasses, cups, eating utensils, towels, bedding, or other items with other people in your home. After using these items, you should wash them thoroughly with soap and water.  Cover your coughs and sneezes Cover your mouth and nose with a tissue when you cough or sneeze, or you can cough or sneeze into your sleeve. Throw used tissues in a lined trash can, and immediately wash your hands with soap and water for at least 20 seconds or use an alcohol-based hand rub.  Wash your [facility] your hands often and thoroughly with soap and water for at least 20 seconds. You can use an alcohol-based hand sanitizer if soap and water are not available and if your hands are not visibly dirty. Avoid touching your eyes, nose, and  mouth with unwashed hands.   Prevention Steps for Caregivers and Household Members of Individuals Confirmed to have, or Being Evaluated for, or have symptoms of 2019 Novel Coronavirus (COVID-19) Infection Being Cared for in the Home  If you live with, or provide care at home for, a person confirmed to have, or being evaluated for, COVID-19 infection please follow these guidelines to prevent infection:  Follow healthcare provider's instructions Make sure that you understand and can help the patient follow any healthcare provider instructions for all care.  Provide for the patient's basic needs You should help the patient with basic needs in the home and provide support for getting groceries, prescriptions, and other personal needs.  Monitor the patient's symptoms If they are getting sicker, call his or her medical provider and tell them that the patient has, or is being evaluated for, COVID-19 infection. This will help the healthcare provider's office take steps to keep other people from getting infected. Ask the healthcare provider to call the local or state health department.  Limit the number of people who have contact with the patient If possible, have only one caregiver for the patient. Other household members should stay in another home or place of residence. If this is not possible, they should stay  in another room, or be separated from the patient as much as possible. Use a separate bathroom, if available. Restrict visitors who do not have an essential need to be in the home.  Keep older adults, very young children, and other sick people away from the patient Keep older adults, very young children, and those who have compromised immune systems or chronic health conditions away from the patient. This includes people with chronic heart, lung, or kidney conditions, diabetes, and cancer.  Ensure good ventilation Make sure that shared spaces in the home have good air flow, such as from  an air conditioner or an opened window, weather permitting.  Wash your hands often Wash your hands often and thoroughly with soap and water for at least 20 seconds. You can use an alcohol based hand sanitizer if soap and water are not available and if your hands are not visibly dirty. Avoid touching your eyes, nose, and mouth with unwashed hands. Use disposable paper towels to dry your hands. If not available, use dedicated cloth towels and replace them when they become wet.  Wear a facemask and gloves Wear a disposable facemask at all times in the room and gloves when you touch or have contact with the patient's blood, body fluids, and/or secretions or excretions, such as sweat, saliva, sputum, nasal mucus, vomit, urine, or feces.  Ensure the mask fits over your nose and mouth tightly, and do not touch it during use. Throw out disposable facemasks and gloves after using them. Do not reuse. Wash your hands immediately after removing your facemask and gloves. If your personal clothing becomes contaminated, carefully remove clothing and launder. Wash your hands after handling contaminated clothing. Place all used disposable facemasks, gloves, and other waste in a lined container before disposing them with other household waste. Remove gloves and wash your hands immediately after handling these items.  Do not share dishes, glasses, or other household items with the patient Avoid sharing household items. You should not share dishes, drinking glasses, cups, eating utensils, towels, bedding, or other items with a patient who is confirmed to have, or being evaluated for, COVID-19 infection. After the person uses these items, you should wash them thoroughly with soap and water.  Wash laundry thoroughly Immediately remove and wash clothes or bedding that have blood, body fluids, and/or secretions or excretions, such as sweat, saliva, sputum, nasal mucus, vomit, urine, or feces, on them. Wear gloves when  handling laundry from the patient. Read and follow directions on labels of laundry or clothing items and detergent. In general, wash and dry with the warmest temperatures recommended on the label.  Clean all areas the individual has used often Clean all touchable surfaces, such as counters, tabletops, doorknobs, bathroom fixtures, toilets, phones, keyboards, tablets, and bedside tables, every day. Also, clean any surfaces that may have blood, body fluids, and/or secretions or excretions on them. Wear gloves when cleaning surfaces the patient has come in contact with. Use a diluted bleach solution (e.g., dilute bleach with 1 part bleach and 10 parts water) or a household disinfectant with a label that says EPA-registered for coronaviruses. To make a bleach solution at home, add 1 tablespoon of bleach to 1 quart (4 cups) of water. For a larger supply, add  cup of bleach to 1 gallon (16 cups) of water. Read labels of cleaning products and follow recommendations provided on product labels. Labels contain instructions for safe and effective use of the cleaning product including precautions you should take when applying  the product, such as wearing gloves or eye protection and making sure you have good ventilation during use of the product. Remove gloves and wash hands immediately after cleaning.  Monitor yourself for signs and symptoms of illness Caregivers and household members are considered close contacts, should monitor their health, and will be asked to limit movement outside of the home to the extent possible. Follow the monitoring steps for close contacts listed on the symptom monitoring form.  ? If you have additional questions, contact your local health department or call the epidemiologist on call at [phone] (available 24/7). ? This guidance is subject to change. For the most up-to-date guidance from [facility], please refer to their  website: [email or web address]

## 2019-04-12 NOTE — ED Provider Notes (Signed)
[facility] EMERGENCY DEPARTMENT Provider Note   CSN: [id number] Arrival date & time: 04/12/19  [id number]     History Chief Complaint  Patient presents with  . Chest Pain  . Cough  . Abdominal Pain    Rhonda Mcgee is a 33 y.o. female with history of lupus on Plaquenil, GERD, chronic abdominal pain presents to the ER for several complaints including chest pain, cough, congestion, sore throat, sneezing, nausea, vomiting, abdominal pain, vaginal discharge.  Reports left-sided chest/breast discomfort described as pressure, tightness, nonradiating is constant that began on Thursday.  It is worse with laying flat, coughing, palpation.  Her pain is currently 8.5/10.  Associated with productive cough of yellow/green sputum, sneezing, congestion, sore throat.  Also reports nausea, vomiting and abdominal pain but states this could be from her lupus and acid reflux.  States she has long history of acid reflux and sees GI doctor.  She was admitted for intractable nausea and vomiting earlier this year.  States she usually throws up every morning, daily.  Overall feels like her abdominal pain, nausea and vomiting symptoms are at her baseline.  No hematemesis.  She went straight shopping a few days ago and had lightheadedness, shortness of breath with walking around the store.  She recently started smoking cigarettes and thinks that is why her cough began.  Has had bronchitis in the past as felt similarly.  She took TheraFlu a couple of days ago.  Denies sick contacts.  No exposure to COVID-19.  Denies fever, changes in smell or taste, changes in bowel movements.  She is also requesting antibiotics for bacterial vaginosis and states on Thursday she also began having vaginal discharge and would like history addressed during this visit as well.  Vaginal discharge is described as green and malodorous.  In the past she has had BV and states this feels similarly.  She is sexually active with one  female partner only with condom use approximately 50% of the time.  In the past this partner has given her STDs but states they have not had any issues lately but would still like to be tested.  Again reports generalized abdominal pain, nausea and vomiting that she attributes to lupus and is currently unchanged.  No dysuria, hematuria.  No abnormal vaginal bleeding.   HPI     Past Medical History:  Diagnosis Date  . Chlamydia   . GERD (gastroesophageal reflux disease)   . Gonorrhea   . Herpes genitalia   . Lupus ([place])   . Rheumatoid arthritis(714.0)     Patient Active Problem List   Diagnosis Date Noted  . Abdominal pain, epigastric 12/04/2018  . GERD (gastroesophageal reflux disease) 11/10/2018  . Gastritis 11/10/2018  . NSAID induced gastritis 11/10/2018  . Intractable nausea and vomiting 11/09/2018  . Lupus ([place])   . Hypokalemia   . Vomiting     Past Surgical History:  Procedure Laterality Date  . ADENOIDECTOMY    . CHOLECYSTECTOMY    . DILATION AND CURETTAGE OF UTERUS     elective ab  . GALLBLADDER SURGERY    . TONSILLECTOMY    . TUBAL LIGATION       OB History    Gravida  4   Para      Term      Preterm      AB  2   Living  2     SAB  1   TAB  1   Ectopic  Multiple      Live Births  2        Obstetric Comments  SVD x 2. SAB x 1. EAB x 1        Family History  Adopted: Yes  Problem Relation Age of Onset  . Diabetes Mother   . Hypertension Mother   . Diabetes Sister   . Hypertension Sister   . Lupus Maternal Grandmother   . Esophageal cancer Neg Hx   . Rectal cancer Neg Hx   . Stomach cancer Neg Hx   . Colon cancer Neg Hx     Social History   Tobacco Use  . Smoking status: Current Some Day Smoker    Types: Cigarettes  . Smokeless tobacco: Never Used  . Tobacco comment: trying to quit, no smoking for last 2 weeks  Substance Use Topics  . Alcohol use: Yes    Comment: occasional  . Drug use: No    Home  Medications Prior to Admission medications   Medication Sig Start Date End Date Taking? Authorizing Provider  hydroxychloroquine (PLAQUENIL) 200 MG tablet Take 200 mg by mouth daily.  11/12/18   [provider]  hydrOXYzine (ATARAX/VISTARIL) 25 MG tablet Take 1 tablet (25 mg total) by mouth every 6 (six) hours. 01/09/19   Tacy Learn, PA-C  omeprazole (PRILOSEC) 40 MG capsule Take 1 capsule (40 mg total) by mouth 2 (two) times daily. 12/03/18 01/02/19  Fulp, Cammie, MD  ondansetron (ZOFRAN) 4 MG tablet Take 1 tablet (4 mg total) by mouth every 8 (eight) hours as needed for nausea or vomiting. 12/03/18   Fulp, Cammie, MD    Allergies    Cephalexin and Keflex [cephalexin]  Review of Systems   Review of Systems  HENT: Positive for congestion, postnasal drip, rhinorrhea and sore throat.   Respiratory: Positive for cough and shortness of breath.   Cardiovascular: Positive for chest pain.  Gastrointestinal: Positive for abdominal pain, nausea and vomiting.  Genitourinary: Positive for vaginal discharge.  All other systems reviewed and are negative.   Physical Exam Updated Vital Signs BP 125/84 (BP Location: Right Arm)   Pulse 92   Temp 99.4 F (37.4 C) (Oral)   Resp 16   Ht 5\' 5"  (1.651 m)   Wt 88.5 kg   LMP 03/29/2019   SpO2 100%   BMI 32.45 kg/m   Physical Exam Vitals and nursing note reviewed.  Constitutional:      Appearance: She is well-developed.     Comments: Non toxic in NAD  HENT:     Head: Normocephalic and atraumatic.     Nose: Nose normal.     Mouth/Throat:     Pharynx: Posterior oropharyngeal erythema present.     Comments: Mild diffuse erythema oropharynx. Normal tonsils and uvula. No exudates.  MMM. No intraoral lesions.  Normal phonation.  Tolerating secretions. Eyes:     Conjunctiva/sclera: Conjunctivae normal.  Cardiovascular:     Rate and Rhythm: Normal rate and regular rhythm.  Pulmonary:     Effort: Pulmonary effort is normal.     Breath  sounds: Normal breath sounds.  Abdominal:     General: Bowel sounds are normal.     Palpations: Abdomen is soft.     Tenderness: There is no abdominal tenderness.  Genitourinary:    Comments:  Exam performed with EMT at bedside for assistance. External genitalia without lesions.  No groin lymphadenopathy.  Vaginal mucosa and cervix pink without lesions.  Cervical os visualized, closed without  polyps, friability or lesions.  Moderate amount of white, thin discharge around cervix and vaginal vault.  No CMT.  Nonpalpable, nontender adnexa.  Perianal skin normal without lesions. Musculoskeletal:        General: Normal range of motion.     Cervical back: Normal range of motion.  Skin:    General: Skin is warm and dry.     Capillary Refill: Capillary refill takes less than 2 seconds.  Neurological:     Mental Status: She is alert.  Psychiatric:        Behavior: Behavior normal.     ED Results / Procedures / Treatments   Labs (all labs ordered are listed, but only abnormal results are displayed) Labs Reviewed  WET PREP, GENITAL - Abnormal; Notable for the following components:      Result Value   WBC, Wet Prep HPF POC MODERATE (*)    All other components within normal limits  COMPREHENSIVE METABOLIC PANEL - Abnormal; Notable for the following components:   CO2 20 (*)    All other components within normal limits  CBC - Abnormal; Notable for the following components:   RDW 16.5 (*)    All other components within normal limits  URINALYSIS, ROUTINE W REFLEX MICROSCOPIC - Abnormal; Notable for the following components:   APPearance HAZY (*)    All other components within normal limits  NOVEL CORONAVIRUS, NAA (HOSP ORDER, SEND-OUT TO REF LAB; TAT 18-24 HRS)  LIPASE, BLOOD  I-STAT BETA HCG BLOOD, ED ([facility], [facility], [facility] ONLY)  GC/CHLAMYDIA PROBE AMP ([place]) NOT AT [facility]  TROPONIN I (HIGH SENSITIVITY)    EKG None  Radiology DG Chest Portable 1 View  Result Date: 04/12/2019 CLINICAL  DATA:  Cough EXAM: PORTABLE CHEST 1 VIEW COMPARISON:  12/10/2018 FINDINGS: The heart size and mediastinal contours are within normal limits. Both lungs are clear. The visualized skeletal structures are unremarkable. IMPRESSION: No active disease. Electronically Signed   By: Nelson Chimes M.D.   On: 04/12/2019 12:02    Procedures Procedures (including critical care time)  Medications Ordered in ED Medications  cefTRIAXone (ROCEPHIN) injection 250 mg (250 mg Intramuscular Given 04/12/19 1203)  azithromycin (ZITHROMAX) tablet 1,000 mg (1,000 mg Oral Given 04/12/19 1202)  ondansetron (ZOFRAN-ODT) disintegrating tablet 4 mg (4 mg Oral Given 04/12/19 1202)  promethazine (PHENERGAN) tablet 25 mg (25 mg Oral Given 04/12/19 1332)    ED Course  I have reviewed the triage vital signs and the nursing notes.  Pertinent labs & imaging results that were available during my care of the patient were reviewed by me and considered in my medical decision making (see chart for details).    MDM Rules/Calculators/A&P  33 yo F with lupus on plaquinil with h/o chronic abdominal pain, vomiting presents for several complaints including URI symptoms, chest pain and cough, abdominal pain with nausea and vomiting, vaginal discharge.   Clinically well appearing. Normal VS. No increased work of breathing. Lungs clear. Abdomen non tender.  No CMT on pelvic exam.   ER work up initiated in triage is vastly reassuring and non acute.    Normal labs, UA, CXR, EKG and troponin.  Her CP sounds atypical, reproducible with palpation, cough and movements and likely related to cough.  Trop undetectable and I don't think repeat is necessary as I doubt ACS.  No changes on EKG to suggest pericarditis.  CXR negative. COVID test pending. History and work up not suggestive of PE, pneumonia.    Symptoms likely related to  viral illness or bronchitis.  Will recommend supportive management, isolation pending COVID results.  Return  precautions discussed. Pt in agreement with this.   In regards to vaginal discharge, wet prep shows WBC only  Empirically treated with rocephin/azithromycin.  No CMT, fever, leukocytosis to suggest PID.  Non tender abd.  Return precautions discussed. Pending gonorrhea/chlamydia test.  Final Clinical Impression(s) / ED Diagnoses Final diagnoses:  Viral illness  Chest wall pain  Vaginal discharge    Rx / DC Orders ED Discharge Orders    None       Kinnie Feil, PA-C 04/12/19 1338    Charlesetta Shanks, MD 04/13/19 1049

## 2019-04-13 LAB — GC/CHLAMYDIA PROBE AMP (~~LOC~~) NOT AT ARMC
Chlamydia: NEGATIVE
Neisseria Gonorrhea: NEGATIVE

## 2019-04-13 LAB — NOVEL CORONAVIRUS, NAA (HOSP ORDER, SEND-OUT TO REF LAB; TAT 18-24 HRS): SARS-CoV-2, NAA: NOT DETECTED

## 2019-06-04 DIAGNOSIS — H16223 Keratoconjunctivitis sicca, not specified as Sjogren's, bilateral: Secondary | ICD-10-CM | POA: Insufficient documentation

## 2019-10-11 ENCOUNTER — Emergency Department (HOSPITAL_COMMUNITY): Payer: Medicaid Other

## 2019-10-11 ENCOUNTER — Encounter (HOSPITAL_COMMUNITY): Payer: Self-pay

## 2019-10-11 ENCOUNTER — Emergency Department (HOSPITAL_COMMUNITY)
Admission: EM | Admit: 2019-10-11 | Discharge: 2019-10-11 | Disposition: A | Discharge status: Home / Self Care | Payer: Medicaid Other | Attending: Emergency Medicine | Admitting: Emergency Medicine

## 2019-10-11 ENCOUNTER — Other Ambulatory Visit: Payer: Self-pay

## 2019-10-11 DIAGNOSIS — R109 Unspecified abdominal pain: Secondary | ICD-10-CM | POA: Diagnosis not present

## 2019-10-11 DIAGNOSIS — Z5321 Procedure and treatment not carried out due to patient leaving prior to being seen by health care provider: Secondary | ICD-10-CM | POA: Diagnosis not present

## 2019-10-11 DIAGNOSIS — R079 Chest pain, unspecified: Secondary | ICD-10-CM | POA: Diagnosis not present

## 2019-10-11 DIAGNOSIS — R0789 Other chest pain: Secondary | ICD-10-CM | POA: Insufficient documentation

## 2019-10-11 LAB — COMPREHENSIVE METABOLIC PANEL
ALT: 16 U/L (ref 0–44)
AST: 18 U/L (ref 15–41)
Albumin: 3.6 g/dL (ref 3.5–5.0)
Alkaline Phosphatase: 69 U/L (ref 38–126)
Anion gap: 9 (ref 5–15)
BUN: 7 mg/dL (ref 6–20)
CO2: 20 mmol/L — ABNORMAL LOW (ref 22–32)
Calcium: 9 mg/dL (ref 8.9–10.3)
Chloride: 109 mmol/L (ref 98–111)
Creatinine, Ser: 0.81 mg/dL (ref 0.44–1.00)
GFR calc Af Amer: >60 mL/min (ref >60)
GFR calc non Af Amer: >60 mL/min (ref >60)
Glucose, Bld: 94 mg/dL (ref 70–99)
Potassium: 3.5 mmol/L (ref 3.5–5.1)
Sodium: 138 mmol/L (ref 135–145)
Total Bilirubin: 0.9 mg/dL (ref 0.3–1.2)
Total Protein: 7 g/dL (ref 6.5–8.1)

## 2019-10-11 LAB — URINALYSIS, ROUTINE W REFLEX MICROSCOPIC
Bilirubin Urine: NEGATIVE
Glucose, UA: NEGATIVE mg/dL
Hgb urine dipstick: NEGATIVE
Ketones, ur: NEGATIVE mg/dL
Leukocytes,Ua: NEGATIVE
Nitrite: NEGATIVE
Protein, ur: NEGATIVE mg/dL
Specific Gravity, Urine: 1.016 (ref 1.005–1.030)
pH: 6 (ref 5.0–8.0)

## 2019-10-11 LAB — CBC
HCT: 36.5 % (ref 36.0–46.0)
Hemoglobin: 11.4 g/dL — ABNORMAL LOW (ref 12.0–15.0)
MCH: 25.9 pg — ABNORMAL LOW (ref 26.0–34.0)
MCHC: 31.2 g/dL (ref 30.0–36.0)
MCV: 83 fL (ref 80.0–100.0)
Platelets: 299 10*3/uL (ref 150–400)
RBC: 4.4 MIL/uL (ref 3.87–5.11)
RDW: 15.5 % (ref 11.5–15.5)
WBC: 5.2 10*3/uL (ref 4.0–10.5)
nRBC: 0 % (ref 0.0–0.2)

## 2019-10-11 LAB — TROPONIN I (HIGH SENSITIVITY): Troponin I (High Sensitivity): <2 ng/L (ref <18)

## 2019-10-11 LAB — LIPASE, BLOOD: Lipase: 26 U/L (ref 11–51)

## 2019-10-11 LAB — I-STAT BETA HCG BLOOD, ED (MC, WL, AP ONLY): I-stat hCG, quantitative: <5 m[IU]/mL (ref <5)

## 2019-10-11 MED ORDER — SODIUM CHLORIDE 0.9% FLUSH: 3.0000 mL | Freq: Once | INTRAVENOUS | Status: DC

## 2019-10-11 NOTE — ED Notes (Signed)
Pt decided to leave the emergency department 

## 2019-10-11 NOTE — ED Triage Notes (Addendum)
Pt arrives to ED w/ c/o abdominal pain and L sided rib/chest pain since Thurs. Pt c/o 8/10 pain. Pt endorses n/v, gas. Denies diarrhea. Pt endorses sob.

## 2019-11-23 DIAGNOSIS — Z3202 Encounter for pregnancy test, result negative: Secondary | ICD-10-CM | POA: Diagnosis not present

## 2019-11-23 DIAGNOSIS — R300 Dysuria: Secondary | ICD-10-CM | POA: Diagnosis not present

## 2019-11-23 DIAGNOSIS — D72819 Decreased white blood cell count, unspecified: Secondary | ICD-10-CM | POA: Diagnosis not present

## 2019-11-23 DIAGNOSIS — J069 Acute upper respiratory infection, unspecified: Secondary | ICD-10-CM | POA: Diagnosis not present

## 2019-11-23 DIAGNOSIS — R05 Cough: Secondary | ICD-10-CM | POA: Diagnosis not present

## 2019-11-23 DIAGNOSIS — J392 Other diseases of pharynx: Secondary | ICD-10-CM | POA: Diagnosis not present

## 2019-11-23 DIAGNOSIS — R0981 Nasal congestion: Secondary | ICD-10-CM | POA: Diagnosis not present

## 2019-11-23 DIAGNOSIS — R079 Chest pain, unspecified: Secondary | ICD-10-CM | POA: Diagnosis not present

## 2020-02-07 ENCOUNTER — Emergency Department (HOSPITAL_COMMUNITY)
Admission: EM | Admit: 2020-02-07 | Discharge: 2020-02-08 | Disposition: A | Discharge status: Home / Self Care | Payer: Medicaid Other | Attending: Emergency Medicine | Admitting: Emergency Medicine

## 2020-02-07 ENCOUNTER — Encounter (HOSPITAL_COMMUNITY): Payer: Self-pay | Admitting: Emergency Medicine

## 2020-02-07 DIAGNOSIS — R109 Unspecified abdominal pain: Secondary | ICD-10-CM

## 2020-02-07 DIAGNOSIS — R1032 Left lower quadrant pain: Secondary | ICD-10-CM | POA: Diagnosis not present

## 2020-02-07 DIAGNOSIS — D259 Leiomyoma of uterus, unspecified: Secondary | ICD-10-CM | POA: Diagnosis not present

## 2020-02-07 DIAGNOSIS — R079 Chest pain, unspecified: Secondary | ICD-10-CM

## 2020-02-07 DIAGNOSIS — F17210 Nicotine dependence, cigarettes, uncomplicated: Secondary | ICD-10-CM | POA: Diagnosis not present

## 2020-02-07 DIAGNOSIS — R112 Nausea with vomiting, unspecified: Secondary | ICD-10-CM | POA: Insufficient documentation

## 2020-02-07 DIAGNOSIS — R509 Fever, unspecified: Secondary | ICD-10-CM

## 2020-02-07 DIAGNOSIS — D250 Submucous leiomyoma of uterus: Secondary | ICD-10-CM | POA: Diagnosis not present

## 2020-02-07 DIAGNOSIS — Z79899 Other long term (current) drug therapy: Secondary | ICD-10-CM | POA: Insufficient documentation

## 2020-02-07 DIAGNOSIS — R059 Cough, unspecified: Secondary | ICD-10-CM | POA: Insufficient documentation

## 2020-02-07 DIAGNOSIS — R0789 Other chest pain: Secondary | ICD-10-CM | POA: Diagnosis not present

## 2020-02-07 DIAGNOSIS — Z20822 Contact with and (suspected) exposure to covid-19: Secondary | ICD-10-CM | POA: Insufficient documentation

## 2020-02-07 DIAGNOSIS — R197 Diarrhea, unspecified: Secondary | ICD-10-CM | POA: Insufficient documentation

## 2020-02-07 DIAGNOSIS — R6883 Chills (without fever): Secondary | ICD-10-CM | POA: Insufficient documentation

## 2020-02-07 NOTE — ED Triage Notes (Signed)
Pt c/o of productive cough with thick mucus for the last 2-3 days, pt does have hx of lupus and due to lack of insurance has been unable to see her doctor. Pt also concerned about bumps under her eyes.

## 2020-02-08 ENCOUNTER — Emergency Department (HOSPITAL_COMMUNITY): Payer: Medicaid Other

## 2020-02-08 DIAGNOSIS — D259 Leiomyoma of uterus, unspecified: Secondary | ICD-10-CM | POA: Diagnosis not present

## 2020-02-08 DIAGNOSIS — R109 Unspecified abdominal pain: Secondary | ICD-10-CM | POA: Diagnosis not present

## 2020-02-08 DIAGNOSIS — R0789 Other chest pain: Secondary | ICD-10-CM | POA: Diagnosis not present

## 2020-02-08 DIAGNOSIS — R059 Cough, unspecified: Secondary | ICD-10-CM | POA: Diagnosis not present

## 2020-02-08 LAB — COMPREHENSIVE METABOLIC PANEL
ALT: 17 U/L (ref 0–44)
AST: 16 U/L (ref 15–41)
Albumin: 3.4 g/dL — ABNORMAL LOW (ref 3.5–5.0)
Alkaline Phosphatase: 63 U/L (ref 38–126)
Anion gap: 12 (ref 5–15)
BUN: 8 mg/dL (ref 6–20)
CO2: 17 mmol/L — ABNORMAL LOW (ref 22–32)
Calcium: 8.8 mg/dL — ABNORMAL LOW (ref 8.9–10.3)
Chloride: 106 mmol/L (ref 98–111)
Creatinine, Ser: 0.78 mg/dL (ref 0.44–1.00)
GFR, Estimated: >60 mL/min (ref >60)
Glucose, Bld: 85 mg/dL (ref 70–99)
Potassium: 3.7 mmol/L (ref 3.5–5.1)
Sodium: 135 mmol/L (ref 135–145)
Total Bilirubin: 0.3 mg/dL (ref 0.3–1.2)
Total Protein: 6.8 g/dL (ref 6.5–8.1)

## 2020-02-08 LAB — CBC WITH DIFFERENTIAL/PLATELET
Abs Immature Granulocytes: 0.01 10*3/uL (ref 0.00–0.07)
Basophils Absolute: 0 10*3/uL (ref 0.0–0.1)
Basophils Relative: 1 %
Eosinophils Absolute: 0.2 10*3/uL (ref 0.0–0.5)
Eosinophils Relative: 3 %
HCT: 37 % (ref 36.0–46.0)
Hemoglobin: 11.5 g/dL — ABNORMAL LOW (ref 12.0–15.0)
Immature Granulocytes: 0 %
Lymphocytes Relative: 54 %
Lymphs Abs: 2.9 10*3/uL (ref 0.7–4.0)
MCH: 25.7 pg — ABNORMAL LOW (ref 26.0–34.0)
MCHC: 31.1 g/dL (ref 30.0–36.0)
MCV: 82.8 fL (ref 80.0–100.0)
Monocytes Absolute: 0.6 10*3/uL (ref 0.1–1.0)
Monocytes Relative: 10 %
Neutro Abs: 1.8 10*3/uL (ref 1.7–7.7)
Neutrophils Relative %: 32 %
Platelets: 276 10*3/uL (ref 150–400)
RBC: 4.47 MIL/uL (ref 3.87–5.11)
RDW: 16.2 % — ABNORMAL HIGH (ref 11.5–15.5)
WBC: 5.5 10*3/uL (ref 4.0–10.5)
nRBC: 0 % (ref 0.0–0.2)

## 2020-02-08 LAB — RESPIRATORY PANEL BY RT PCR (FLU A&B, COVID)
Influenza A by PCR: NEGATIVE
Influenza B by PCR: NEGATIVE
SARS Coronavirus 2 by RT PCR: NEGATIVE

## 2020-02-08 LAB — LIPASE, BLOOD: Lipase: 35 U/L (ref 11–51)

## 2020-02-08 LAB — I-STAT BETA HCG BLOOD, ED (MC, WL, AP ONLY): I-stat hCG, quantitative: <5 m[IU]/mL (ref <5)

## 2020-02-08 LAB — TROPONIN I (HIGH SENSITIVITY)
Troponin I (High Sensitivity): <2 ng/L (ref <18)
Troponin I (High Sensitivity): <2 ng/L (ref <18)

## 2020-02-08 LAB — D-DIMER, QUANTITATIVE: D-Dimer, Quant: 0.5 ug/mL-FEU (ref 0.00–0.50)

## 2020-02-08 MED ORDER — DIPHENHYDRAMINE HCL 50 MG/ML IJ SOLN
25.0000 mg | Freq: Once | INTRAMUSCULAR | Status: AC
  Administered 2020-02-08: 25 mg via INTRAVENOUS
  Filled 2020-02-08: qty 1

## 2020-02-08 MED ORDER — IOHEXOL 300 MG/ML  SOLN
100.0000 mL | Freq: Once | INTRAMUSCULAR | Status: AC | PRN
  Administered 2020-02-08: 100 mL via INTRAVENOUS

## 2020-02-08 MED ORDER — DM-GUAIFENESIN ER 30-600 MG PO TB12: 1.0000 | ORAL_TABLET | Freq: Two times a day (BID) | ORAL | 0 refills | Status: DC

## 2020-02-08 MED ORDER — SODIUM CHLORIDE 0.9 % IV BOLUS
1000.0000 mL | Freq: Once | INTRAVENOUS | Status: AC
  Administered 2020-02-08: 1000 mL via INTRAVENOUS

## 2020-02-08 MED ORDER — ACETAMINOPHEN 500 MG PO TABS
1000.0000 mg | ORAL_TABLET | Freq: Once | ORAL | Status: AC
  Administered 2020-02-08: 1000 mg via ORAL
  Filled 2020-02-08: qty 2

## 2020-02-08 MED ORDER — ONDANSETRON HCL 4 MG/2ML IJ SOLN
4.0000 mg | Freq: Once | INTRAMUSCULAR | Status: AC
  Administered 2020-02-08: 4 mg via INTRAVENOUS
  Filled 2020-02-08: qty 2

## 2020-02-08 MED ORDER — IBUPROFEN 800 MG PO TABS: 800.0000 mg | ORAL_TABLET | Freq: Three times a day (TID) | ORAL | 0 refills | Status: DC

## 2020-02-08 MED ORDER — MORPHINE SULFATE (PF) 4 MG/ML IV SOLN
4.0000 mg | Freq: Once | INTRAVENOUS | Status: AC
  Administered 2020-02-08: 4 mg via INTRAVENOUS
  Filled 2020-02-08: qty 1

## 2020-02-08 NOTE — Discharge Instructions (Addendum)
Your work-up today has been very reassuring and does not suggest an acute problem with your heart or lungs.  Cough may be due to bronchitis, your Covid test was negative as well as your flu test.

## 2020-02-08 NOTE — ED Notes (Signed)
Pt started to itch upon receiving morphine. About 3/4 of ordered dose given. PA notified. Benadryl administered. Pt monitored

## 2020-02-08 NOTE — ED Notes (Signed)
Pt c/o her heart feeling like it's "fluttering." EKG shot

## 2020-02-08 NOTE — ED Provider Notes (Signed)
[place] EMERGENCY DEPARTMENT Provider Note   CSN: [id number] Arrival date & time: 02/07/20  1426     History Chief Complaint  Patient presents with  . Cough  . Chills    Rhonda Mcgee is a 34 y.o. female.  Rhonda Mcgee is a 34 y.o. female with a history of lupus, RA, GERD, who presents to the emergency department for evaluation chest pain, abdominal pain and cough.  Patient states she has been experiencing chest and abdominal pain that is intermittent for the past 5 days.  She reports pain is located on the left side of her chest, is worse with movement, cough and breathing and radiates down into her abdomen and flank.   States she intermittently feels some chest fluttering, but denies this currently. She reports that she always has some pelvic pain but that this abdominal pain is worse than usual.  Reports intermittent nausea, vomiting and diarrhea, has not noted any blood in her stool.  She has had a frequent cough that is occasionally productive of mucus.  Denies any fevers.  No congestion or sore throat.  No known sick contacts.  Did get her Covid vaccine.  She states that she has had some similar symptoms intermittently over the past few months ever since she got her vaccine and thinks that it is all related to this.  Patient also reports pelvic pain and vaginal bleeding which is frequent and heavy, currently having bleeding but denies any discharge, odor or discomfort.  No dysuria or urinary frequency.  She states that she has tried multiple over-the-counter medications for her cough and pain without much improvement.  Patient states that all the symptoms may occur very anxious.  She has not been able to see her regular doctor because she lost her insurance.         Past Medical History:  Diagnosis Date  . Chlamydia   . GERD (gastroesophageal reflux disease)   . Gonorrhea   . Herpes genitalia   . Lupus ([place])   . Rheumatoid arthritis(714.0)      Patient Active Problem List   Diagnosis Date Noted  . Abdominal pain, epigastric 12/04/2018  . GERD (gastroesophageal reflux disease) 11/10/2018  . Gastritis 11/10/2018  . NSAID induced gastritis 11/10/2018  . Intractable nausea and vomiting 11/09/2018  . Lupus ([place])   . Hypokalemia   . Vomiting     Past Surgical History:  Procedure Laterality Date  . ADENOIDECTOMY    . CHOLECYSTECTOMY    . DILATION AND CURETTAGE OF UTERUS     elective ab  . GALLBLADDER SURGERY    . TONSILLECTOMY    . TUBAL LIGATION       OB History    Gravida  4   Para      Term      Preterm      AB  2   Living  2     SAB  1   TAB  1   Ectopic      Multiple      Live Births  2        Obstetric Comments  SVD x 2. SAB x 1. EAB x 1        Family History  Adopted: Yes  Problem Relation Age of Onset  . Diabetes Mother   . Hypertension Mother   . Diabetes Sister   . Hypertension Sister   . Lupus Maternal Grandmother   . Esophageal cancer Neg Hx   .  Rectal cancer Neg Hx   . Stomach cancer Neg Hx   . Colon cancer Neg Hx     Social History   Tobacco Use  . Smoking status: Current Some Day Smoker    Types: Cigarettes  . Smokeless tobacco: Never Used  . Tobacco comment: trying to quit, no smoking for last 2 weeks  Vaping Use  . Vaping Use: Never used  Substance Use Topics  . Alcohol use: Yes    Comment: occasional  . Drug use: No    Home Medications Prior to Admission medications   Medication Sig Start Date End Date Taking? Authorizing Provider  dextromethorphan-guaiFENesin (MUCINEX DM) 30-600 MG 12hr tablet Take 1 tablet by mouth 2 (two) times daily. 02/08/20   Jacqlyn Larsen, PA-C  hydroxychloroquine (PLAQUENIL) 200 MG tablet Take 200 mg by mouth daily.  11/12/18   [provider]  hydrOXYzine (ATARAX/VISTARIL) 25 MG tablet Take 1 tablet (25 mg total) by mouth every 6 (six) hours. 01/09/19   Tacy Learn, PA-C  ibuprofen (ADVIL) 800 MG tablet Take 1  tablet (800 mg total) by mouth 3 (three) times daily. 02/08/20   Jacqlyn Larsen, PA-C  omeprazole (PRILOSEC) 40 MG capsule Take 1 capsule (40 mg total) by mouth 2 (two) times daily. 12/03/18 01/02/19  Fulp, Cammie, MD  ondansetron (ZOFRAN) 4 MG tablet Take 1 tablet (4 mg total) by mouth every 8 (eight) hours as needed for nausea or vomiting. 12/03/18   Fulp, Ander Gaster, MD    Allergies    Cephalexin and Keflex [cephalexin]  Review of Systems   Review of Systems  Constitutional: Positive for chills. Negative for fever.  HENT: Positive for congestion and rhinorrhea.   Eyes: Negative for visual disturbance.  Respiratory: Positive for cough and shortness of breath.   Cardiovascular: Positive for chest pain.  Gastrointestinal: Positive for abdominal pain, diarrhea, nausea and vomiting. Negative for blood in stool and constipation.  Genitourinary: Positive for flank pain, pelvic pain and vaginal bleeding. Negative for dysuria and vaginal discharge.  Musculoskeletal: Positive for myalgias. Negative for arthralgias.  Skin: Negative for color change and rash.  Neurological: Negative for dizziness, syncope, light-headedness and headaches.  Psychiatric/Behavioral: The patient is nervous/anxious.     Physical Exam Updated Vital Signs BP 115/70 (BP Location: Right Arm)   Pulse 75   Temp 98 F (36.7 C) (Oral)   Resp 17   Ht 5\' 5"  (1.651 m)   Wt 88.5 kg   SpO2 100%   BMI 32.45 kg/m   Physical Exam Vitals and nursing note reviewed.  Constitutional:      General: She is not in acute distress.    Appearance: Normal appearance. She is well-developed. She is not ill-appearing or diaphoretic.     Comments: Patient appears anxious, but is overall well-appearing and in no distress  HENT:     Head: Normocephalic and atraumatic.     Mouth/Throat:     Mouth: Mucous membranes are moist.     Pharynx: Oropharynx is clear.  Eyes:     General:        Right eye: No discharge.        Left eye: No discharge.   Cardiovascular:     Rate and Rhythm: Normal rate and regular rhythm.     Pulses: Normal pulses.     Heart sounds: Normal heart sounds. No murmur heard.  No friction rub. No gallop.   Pulmonary:     Effort: Pulmonary effort is normal. No respiratory  distress.     Breath sounds: Normal breath sounds. No wheezing or rales.     Comments: Respirations are equal and unlabored, patient able to speak in full sentences, lungs clear to auscultation bilaterally, patient does have frequent cough on exam which is nonproductive, she does have tenderness over the left side of the chest without palpable deformity or overlying skin changes. Chest:     Chest wall: Tenderness present.  Abdominal:     General: Bowel sounds are normal. There is no distension.     Palpations: Abdomen is soft. There is no mass.     Tenderness: There is abdominal tenderness. There is no guarding.     Comments: Abdomen is soft and and nondistended, bowel sounds present throughout, there is some tenderness over the left side of the abdomen without guarding or rebound tenderness, no CVA tenderness bilaterally.  Musculoskeletal:        General: No deformity.     Cervical back: Neck supple.  Skin:    General: Skin is warm and dry.     Capillary Refill: Capillary refill takes less than 2 seconds.  Neurological:     Mental Status: She is alert and oriented to person, place, and time.     Coordination: Coordination normal.     Comments: Speech is clear, able to follow commands Moves extremities without ataxia, coordination intact  Psychiatric:        Mood and Affect: Mood is anxious. Affect is tearful.        Behavior: Behavior normal.     ED Results / Procedures / Treatments   Labs (all labs ordered are listed, but only abnormal results are displayed) Labs Reviewed  COMPREHENSIVE METABOLIC PANEL - Abnormal; Notable for the following components:      Result Value   CO2 17 (*)    Calcium 8.8 (*)    Albumin 3.4 (*)    All  other components within normal limits  CBC WITH DIFFERENTIAL/PLATELET - Abnormal; Notable for the following components:   Hemoglobin 11.5 (*)    MCH 25.7 (*)    RDW 16.2 (*)    All other components within normal limits  RESPIRATORY PANEL BY RT PCR (FLU A&B, COVID)  LIPASE, BLOOD  D-DIMER, QUANTITATIVE (NOT AT [facility])  URINALYSIS, ROUTINE W REFLEX MICROSCOPIC  I-STAT BETA HCG BLOOD, ED ([facility], [facility], [facility] ONLY)  TROPONIN I (HIGH SENSITIVITY)  TROPONIN I (HIGH SENSITIVITY)    EKG EKG Interpretation  Date/Time:  Tuesday February 08 2020 00:57:28 EDT Ventricular Rate:  78 PR Interval:  148 QRS Duration: 70 QT Interval:  368 QTC Calculation: 419 R Axis:   54 Text Interpretation: Normal sinus rhythm Normal ECG When compared with ECG of 10/11/2019, No significant change was found Confirmed by Delora Fuel ([id number]) on 02/08/2020 1:40:35 AM   Radiology CT Abdomen Pelvis W Contrast  Result Date: 02/08/2020 CLINICAL DATA:  Lower abdominal pain. EXAM: CT ABDOMEN AND PELVIS WITH CONTRAST TECHNIQUE: Multidetector CT imaging of the abdomen and pelvis was performed using the standard protocol following bolus administration of intravenous contrast. CONTRAST:  146mL OMNIPAQUE IOHEXOL 300 MG/ML  SOLN COMPARISON:  CT abdomen pelvis 10/26/2016 FINDINGS: Lower chest: No acute abnormality. Hepatobiliary: There is a hypodense lesion with peripheral clumped enhancement in the left hepatic lobe measuring 1.5 x 1.0 cm and consistent with a benign hemangioma. Gallbladder is surgically absent. No biliary duct dilation. Pancreas: Unremarkable. No pancreatic ductal dilatation or surrounding inflammatory changes. Spleen: Normal in size without focal abnormality. Adrenals/Urinary Tract:  Adrenal glands are unremarkable. Kidneys are normal, without renal calculi, focal lesion, or hydronephrosis. Bladder is unremarkable. Stomach/Bowel: Stomach is within normal limits. Appendix appears normal. No evidence of bowel wall thickening,  distention, or inflammatory changes. Vascular/Lymphatic: No significant vascular findings are present. No enlarged abdominal or pelvic lymph nodes. Reproductive: Uterine fibroids.  Bilateral adnexa are unremarkable. Other: No abdominal wall hernia or abnormality. No abdominopelvic ascites. Musculoskeletal: No acute or significant osseous findings. IMPRESSION: 1. No acute findings in the abdomen or pelvis. 2. 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 3. Fibroid uterus. Electronically Signed   By: Audie Pinto M.D.   On: 02/08/2020 13:27   DG Chest Port 1 View  Result Date: 02/08/2020 CLINICAL DATA:  Cough, chills EXAM: PORTABLE CHEST 1 VIEW COMPARISON:  None. FINDINGS: The heart size and mediastinal contours are within normal limits. Both lungs are clear. No pleural effusion. The visualized skeletal structures are unremarkable. IMPRESSION: No acute process in the chest. Electronically Signed   By: Macy Mis M.D.   On: 02/08/2020 11:43    Procedures Procedures (including critical care time)  Medications Ordered in ED Medications  sodium chloride 0.9 % bolus 1,000 mL (0 mLs Intravenous Stopped 02/08/20 1115)  ondansetron (ZOFRAN) injection 4 mg (4 mg Intravenous Given 02/08/20 0920)  acetaminophen (TYLENOL) tablet 1,000 mg (1,000 mg Oral Given 02/08/20 0922)  morphine 4 MG/ML injection 4 mg (4 mg Intravenous Given 02/08/20 1144)  diphenhydrAMINE (BENADRYL) injection 25 mg (25 mg Intravenous Given 02/08/20 1155)  iohexol (OMNIPAQUE) 300 MG/ML solution 100 mL (100 mLs Intravenous Contrast Given 02/08/20 1258)    ED Course  I have reviewed the triage vital signs and the nursing notes.  Pertinent labs & imaging results that were available during my care of the patient were reviewed by me and considered in my medical decision making (see chart for details).    MDM Rules/Calculators/A&P                          34 year old pain initially was intermittent but has become more constant.   She reports some associated shortness of breath.  Reports vomiting and diarrhea at home but no active vomiting here in the ED.  Reports that she has had similar symptoms intermittently over the past few months ever since she got her Covid vaccine and feels that they are all related.  She has not had any known Covid contacts.  Has been taking over-the-counter medications without improvement.  Given patient's Friday of symptoms will get abdominal labs as well as troponin and D-dimer given pleuritic aspects of pain.  Will get chest x-ray, EKG, and will likely need CT abdomen pelvis if pain persists.  Will hold off until D-dimer returns to know if patient will need chest CT.  IV fluids and symptomatic management given.  I have independently ordered, reviewed and interpreted all labs and imaging: CBC: No leukocytosis, stable hemoglobin CMP: CO2 of 17, no other significant electrolyte derangements, normal renal and liver function. Lipase: WNL D-dimer: Not elevated at 0.5 Troponin: Negative x2 Covid: Negative  EKG shows sinus rhythm with no concerning changes when compared to previous.  Chest x-ray is clear with no pneumonia or other active cardiopulmonary disease.  Given that D-dimer is negative patient will not require CTA of the chest we will proceed with CT abdomen pelvis for evaluation of pain.  CT shows uterine fibroids which explains patient's pelvic pain and vaginal bleeding, but no other acute abnormalities  found.  After symptomatic management patient is overall feeling much improved.  Will treat at home with Mucinex and ibuprofen to help with pelvic and chest wall pain.  Pain is consistently reproducible on exam and cardiac and pulmonary work-up have been very reassuring.  Discussed results and plan with patient.  At this time there does not appear to be any evidence of an acute emergency medical condition and the patient appears stable for discharge with appropriate outpatient follow up.  Diagnosis was discussed with patient who verbalizes understanding and is agreeable to discharge.    Final Clinical Impression(s) / ED Diagnoses Final diagnoses:  Left-sided chest pain  Left sided abdominal pain  Cough  Uterine leiomyoma, unspecified location    Rx / DC Orders ED Discharge Orders         Ordered    ibuprofen (ADVIL) 800 MG tablet  3 times daily        02/08/20 1442    dextromethorphan-guaiFENesin (MUCINEX DM) 30-600 MG 12hr tablet  2 times daily        02/08/20 1442           Jacqlyn Larsen, PA-C 02/08/20 [place], [place], DO 02/09/20 [phone]

## 2020-02-09 ENCOUNTER — Telehealth: Payer: Self-pay

## 2020-02-09 ENCOUNTER — Telehealth: Payer: Self-pay | Admitting: *Deleted

## 2020-02-09 NOTE — Telephone Encounter (Signed)
Called patient to assist with follow up appointment ,left message to call back. °Rhonda Mcgee °PEC °[phone] ° °

## 2020-02-09 NOTE — Telephone Encounter (Signed)
Transition Care Management Follow-up Telephone Call  Date of discharge and from where: 02/08/2020 from [facility]  How have you been since you were released from the hospital? Patient stated that she still hurts but it is better.   Any questions or concerns? No  Items Reviewed:  Did the pt receive and understand the discharge instructions provided? Yes   Medications obtained and verified? Yes   Any new allergies since your discharge? No   Dietary orders reviewed? Yes  Do you have support at home? Yes   Functional Questionnaire: (I = Independent and D = Dependent) ADLs: I  Bathing/Dressing- I  Meal Prep- I  Eating- I  Maintaining continence- I  Transferring/Ambulation- I  Managing Meds- I  Follow up appointments reviewed:   [facility] f/u appt confirmed? Yes  Patient stated that she contacted her OB to schedule an appointment and waiting on a return call.   Are transportation arrangements needed? No   If their condition worsens, is the pt aware to call PCP or go to the Emergency Dept.? Yes  Was the patient provided with contact information for the PCP's office or ED? Yes  Was to pt encouraged to call back with questions or concerns? Yes

## 2020-03-03 DIAGNOSIS — R079 Chest pain, unspecified: Secondary | ICD-10-CM | POA: Diagnosis not present

## 2020-03-03 DIAGNOSIS — N898 Other specified noninflammatory disorders of vagina: Secondary | ICD-10-CM | POA: Diagnosis not present

## 2020-03-03 DIAGNOSIS — N179 Acute kidney failure, unspecified: Secondary | ICD-10-CM | POA: Diagnosis not present

## 2020-03-03 DIAGNOSIS — R112 Nausea with vomiting, unspecified: Secondary | ICD-10-CM | POA: Diagnosis not present

## 2020-03-03 DIAGNOSIS — R300 Dysuria: Secondary | ICD-10-CM | POA: Diagnosis not present

## 2020-03-03 DIAGNOSIS — R197 Diarrhea, unspecified: Secondary | ICD-10-CM | POA: Diagnosis not present

## 2020-03-03 DIAGNOSIS — E876 Hypokalemia: Secondary | ICD-10-CM | POA: Diagnosis not present

## 2020-03-04 DIAGNOSIS — R9431 Abnormal electrocardiogram [ECG] [EKG]: Secondary | ICD-10-CM | POA: Diagnosis not present

## 2020-03-06 ENCOUNTER — Telehealth: Payer: Self-pay

## 2020-03-06 NOTE — Telephone Encounter (Signed)
Transition Care Management Follow-up Telephone Call  Date of discharge and from where: 03/04/2020 [facility]  How have you been since you were released from the hospital? Patient stated she is alright, still feeling weak   Any questions or concerns? No  Items Reviewed:  Did the pt receive and understand the discharge instructions provided? Yes   Medications obtained and verified? Yes   Other? No   Any new allergies since your discharge? No   Dietary orders reviewed? Yes  Do you have support at home? Yes   Home Care and Equipment/Supplies: Were home health services ordered? not applicable If so, what is the name of the agency? not applicable  Has the agency set up a time to come to the patient's home? not applicable Were any new equipment or medical supplies ordered?  No What is the name of the medical supply agency? Not applicable Were you able to get the supplies/equipment? not applicable Do you have any questions related to the use of the equipment or supplies? No  Functional Questionnaire: (I = Independent and D = Dependent) ADLs: I  Bathing/Dressing- I  Meal Prep- I  Eating- I  Maintaining continence- I  Transferring/Ambulation- I  Managing Meds- I  Follow up appointments reviewed:   PCP Hospital f/u appt confirmed? No  Patient stated she will get in contact with [facility].  [facility]? No    Are transportation arrangements needed? No   If their condition worsens, is the pt aware to call PCP or go to the Emergency Dept.? Yes  Was the patient provided with contact information for the PCP's office or ED? Yes  Was to pt encouraged to call back with questions or concerns? Yes

## 2020-03-20 ENCOUNTER — Other Ambulatory Visit: Payer: Self-pay

## 2020-03-20 NOTE — Patient Instructions (Signed)
Hi Rhonda Mcgee we missed you today- as a part of your Medicaid benefit, you are eligible for care management and care coordination services at no cost or copay. I was unable to reach you by phone today but would be happy to help you with your health related needs. Please feel free to call me at [phone].  A member of the Managed Medicaid care management team will reach out to you again over the next 7-14 days.  Aida Raider RN, BSN [place]  [employer] Curator - Managed Medicaid High Risk [phone].

## 2020-03-20 NOTE — Patient Outreach (Signed)
Care Coordination  03/20/2020  Breleigh Seminara 02-Feb-1986 [id number]  An unsuccessful telephone outreach was attempted today. The patient was referred to the case management team for assistance with care management and care coordination.   Follow Up Plan: The Managed Medicaid care management team will reach out to the patient again over the next 7-14 days.   Aida Raider RN, BSN [place]  [employer] Curator - Managed Medicaid High Risk [phone].

## 2020-03-31 ENCOUNTER — Other Ambulatory Visit: Payer: Self-pay

## 2020-04-07 ENCOUNTER — Other Ambulatory Visit: Payer: Self-pay

## 2020-04-07 ENCOUNTER — Other Ambulatory Visit: Payer: Self-pay | Admitting: Obstetrics and Gynecology

## 2020-04-07 NOTE — Patient Outreach (Signed)
Care Coordination  04/07/2020  Mariane Giaimo [birth year] [id number]   RNCM called patient at scheduled time.  Patient unable to talk and asked for a return call.  RNCM will reschedule appointment at time requested.  Aida Raider RN, BSN [place]  Tri[employer] Curator - Managed Medicaid High Risk [phone].

## 2020-05-02 ENCOUNTER — Other Ambulatory Visit: Payer: Self-pay

## 2020-05-02 ENCOUNTER — Other Ambulatory Visit: Payer: Self-pay | Admitting: Obstetrics and Gynecology

## 2020-05-02 NOTE — Patient Instructions (Signed)
Hi Rhonda Mcgee, thank you for speaking with me today.  Rhonda Mcgee was given information about Medicaid Managed Care team care coordination services as a part of their [facility] Medicaid benefit. Rhonda Mcgee verbally consented to engagement with the [facility] Managed Care team.   For questions related to your [facility] health plan, please call: [phone]  If you would like to schedule transportation through your [facility] plan, please call the following number at least 2 days in advance of your appointment: [phone]  Goals Addressed            This Visit's Progress   . Protect My Health       Timeframe:  Long-Range Goal Priority:  High Start Date:      05/02/20                       Expected End Date:   06/30/20                    Follow Up Date 06/02/2020   - schedule recommended health tests (blood work, mammogram, colonoscopy, pap test) - schedule and keep appointment for annual check-up              Patient Care Plan: General Plan of Care (Adult)    Problem Identified: Health Promotion or Disease Self-Management (General Plan of Care)   Priority: High  Onset Date: 05/02/2020  Note:   Current Barriers:  . Care Coordination needs related to Lupus and heavy bleeding with periods requriing pad and tampon change every 30 minutes.  Patient is fatigued and feels like she could sleep a long time. . Chronic Disease Management support and education needs related to Lupus.  Nurse Case Manager Clinical Goal(s):  Marland Kitchen Over the next 30 days, patient will work with Managed Medicaid team  to address needs related to Lupus and heavy bleeding.   . Over the next 30 days, patient will attend all scheduled medical appointments:   Interventions:  . Inter-disciplinary care team collaboration (see longitudinal plan of care) . Collaborated with BSW regarding PCP referral.  Patient states she has recently started a new job and plans on obtaining insurance with her new  place of employment when she can. . Discussed plans with patient for ongoing care management follow up and provided patient with direct contact information for care management team  Patient Goals/Self-Care Activities Over the next 30 days, patient will:  -Attends all scheduled provider appointments  Follow Up Plan: The Managed Medicaid care management team will reach out to the patient again over the next 30 days.  The patient has been provided with contact information for the Managed Medicaid care management team and has been advised to call with any health related questions or concerns.     Patient verbalizes understanding of instructions provided today.   The Managed Medicaid care management team will reach out to the patient again over the next 30 days.  The patient has been provided with contact information for the Managed Medicaid care management team and has been advised to call with any health related questions or concerns.   Kathi Der RN, BSN [place]  Triad Engineer, production - Managed Medicaid High Risk [phone]

## 2020-05-02 NOTE — Patient Outreach (Signed)
Medicaid Managed Care   Nurse Care Manager Note  05/02/2020 Name:  Rhonda Mcgee MRN:  [id number] DOB:  09-11-1985  Rhonda Mcgee is an 35 y.o. year old female who is a primary patient of Patient, No Pcp Per.  The [facility] Managed Care Coordination team was consulted for assistance with:    Lupus, bleeding.  Ms. Carton was given information about Medicaid Managed Care Coordination team services today. Rhonda Mcgee agreed to services and verbal consent obtained.  Engaged with patient by telephone for follow up visit in response to provider referral for case management and/or care coordination services.   Assessments/Interventions:  Review of past medical history, allergies, medications, health status, including review of consultants reports, laboratory and other test data, was performed as part of comprehensive evaluation and provision of chronic care management services.  SDOH (Social Determinants of Health) assessments and interventions performed:   Care Plan  Allergies  Allergen Reactions  . Cephalexin Hives  . Keflex [Cephalexin] Hives    Medications Reviewed Today    Reviewed by Danie Chandler, RN (Registered Nurse) on 05/02/20 at 1535  Med List Status: <None>  Medication Order Taking? Sig Documenting Provider Last Dose Status Informant  dextromethorphan-guaiFENesin (MUCINEX DM) 30-600 MG 12hr tablet [id number] No Take 1 tablet by mouth 2 (two) times daily.  Patient not taking: Reported on 05/02/2020   Dartha Lodge, PA-C Not Taking Active   hydroxychloroquine (PLAQUENIL) 200 MG tablet [id number] Yes Take 200 mg by mouth daily.  [provider] Taking Active   hydrOXYzine (ATARAX/VISTARIL) 25 MG tablet [id number] No Take 1 tablet (25 mg total) by mouth every 6 (six) hours.  Patient not taking: Reported on 05/02/2020   Jeannie Fend, PA-C Not Taking Active   ibuprofen (ADVIL) 800 MG tablet [id number] No Take 1 tablet (800 mg total) by mouth 3 (three) times  daily.  Patient not taking: Reported on 05/02/2020   Dartha Lodge, PA-C Not Taking Active   omeprazole (PRILOSEC) 40 MG capsule [id number]  Take 1 capsule (40 mg total) by mouth 2 (two) times daily. Fulp, Cammie, MD  Expired 01/02/19 2359   ondansetron (ZOFRAN) 4 MG tablet [id number] No Take 1 tablet (4 mg total) by mouth every 8 (eight) hours as needed for nausea or vomiting.  Patient not taking: Reported on 05/02/2020   Cain Saupe, MD Not Taking Active           Patient Active Problem List   Diagnosis Date Noted  . Abdominal pain, epigastric 12/04/2018  . GERD (gastroesophageal reflux disease) 11/10/2018  . Gastritis 11/10/2018  . NSAID induced gastritis 11/10/2018  . Intractable nausea and vomiting 11/09/2018  . Lupus (HCC)   . Hypokalemia   . Vomiting     Patient Care Plan: General Plan of Care (Adult)    Problem Identified: Health Promotion or Disease Self-Management (General Plan of Care)   Priority: High  Onset Date: 05/02/2020  Note:   Current Barriers:  . Care Coordination needs related to Lupus and heavy bleeding with periods requriing pad and tampon change every 30 minutes.  Patient is fatigued and feels like she could sleep a long time. . Chronic Disease Management support and education needs related to Lupus.  Nurse Case Manager Clinical Goal(s):  Marland Kitchen Over the next 30 days, patient will work with Managed Medicaid team  to address needs related to Lupus and heavy bleeding.   . Over the next 30 days, patient will attend all scheduled medical appointments:   Interventions:  .  Inter-disciplinary care team collaboration (see longitudinal plan of care) . Collaborated with BSW regarding PCP referral.  Patient states she has recently started a new job and plans on obtaining insurance with her new place of employment when she can. . Discussed plans with patient for ongoing care management follow up and provided patient with direct contact information for care management  team  Patient Goals/Self-Care Activities Over the next 30 days, patient will:  -Attends all scheduled provider appointments  Follow Up Plan: The Managed Medicaid care management team will reach out to the patient again over the next 30 days.  The patient has been provided with contact information for the Managed Medicaid care management team and has been advised to call with any health related questions or concerns.        Follow Up:  Patient agrees to Care Plan and Follow-up.  Plan: The Managed Medicaid care management team will reach out to the patient again over the next 30 days. and The patient has been provided with contact information for the Managed Medicaid care management team and has been advised to call with any health related questions or concerns.  Date/time of next scheduled RN care management/care coordination outreach:  05/30/20 at 330pm

## 2020-05-03 ENCOUNTER — Other Ambulatory Visit: Payer: Self-pay

## 2020-05-03 NOTE — Patient Instructions (Signed)
Visit Information  Rhonda Mcgee was given information about Medicaid Managed Care team care coordination services as a part of their [facility] Medicaid benefit. Rhonda Mcgee verbally consented to engagement with the [facility] Managed Care team.   For questions related to your [facility] health plan, please call: [phone]  If you would like to schedule transportation through your [facility] plan, please call the following number at least 2 days in advance of your appointment: [phone]  Goals Addressed   None    Patient Care Plan: General Plan of Care (Adult)    Problem Identified: Health Promotion or Disease Self-Management (General Plan of Care)   Priority: High  Onset Date: 05/02/2020  Note:   Current Barriers:  . Care Coordination needs related to Lupus and heavy bleeding with periods requriing pad and tampon change every 30 minutes.  Patient is fatigued and feels like she could sleep a long time. . Chronic Disease Management support and education needs related to Lupus.  Nurse Case Manager Clinical Goal(s):  Marland Kitchen Over the next 30 days, patient will work with Managed Medicaid team  to address needs related to Lupus and heavy bleeding.   . Over the next 30 days, patient will attend all scheduled medical appointments:   Interventions:  . Inter-disciplinary care team collaboration (see longitudinal plan of care) . Collaborated with BSW regarding PCP referral.  Patient states she has recently started a new job and plans on obtaining insurance with her new place of employment when she can. . Discussed plans with patient for ongoing care management follow up and provided patient with direct contact information for care management team  Patient Goals/Self-Care Activities Over the next 30 days, patient will:  -Attends all scheduled provider appointments  Follow Up Plan: The Managed Medicaid care management team will reach out to the patient again over the next 30  days.  The patient has been provided with contact information for the Managed Medicaid care management team and has been advised to call with any health related questions or concerns.          Social Worker will follow up with patient on 05/04/2020 to provide appointment information.   Shaune Leeks

## 2020-05-04 ENCOUNTER — Other Ambulatory Visit: Payer: Self-pay

## 2020-05-04 NOTE — Patient Instructions (Addendum)
Visit Information  Rhonda Mcgee was given information about Medicaid Managed Care team care coordination services as a part of their [facility] Medicaid benefit. Rhonda Mcgee verbally consented to engagement with the [facility] Managed Care team.   You have a new patient PCP appointment on 05/24/2020 at 1pm at [facility] located at [address] [place], [place], [address], they can be contacted at [phone]. And you OB appointment on 06/13/20 at 3:15, they are located at [address] Floor, [place], [address]. If for any reason you need to cancel or reschedule please contact them to do so. If you have any questions I can be reached at [phone].  For questions related to your [facility] health plan, please call: [phone]  If you would like to schedule transportation through your [facility] plan, please call the following number at least 2 days in advance of your appointment: [phone]  Goals Addressed   None    Patient Care Plan: General Plan of Care (Adult)    Problem Identified: Health Promotion or Disease Self-Management (General Plan of Care)   Priority: High  Onset Date: 05/02/2020  Note:   Current Barriers:  . Care Coordination needs related to Lupus and heavy bleeding with periods requriing pad and tampon change every 30 minutes.  Patient is fatigued and feels like she could sleep a long time. . Chronic Disease Management support and education needs related to Lupus.  Nurse Case Manager Clinical Goal(s):  Marland Kitchen Over the next 30 days, patient will work with Managed Medicaid team  to address needs related to Lupus and heavy bleeding.   . Over the next 30 days, patient will attend all scheduled medical appointments:   Interventions:  . Inter-disciplinary care team collaboration (see longitudinal plan of care) . Collaborated with BSW regarding PCP referral.  Patient states she has recently started a new job and plans on obtaining insurance  with her new place of employment when she can. . Discussed plans with patient for ongoing care management follow up and provided patient with direct contact information for care management team  Patient Goals/Self-Care Activities Over the next 30 days, patient will:  -Attends all scheduled provider appointments  Follow Up Plan: The Managed Medicaid care management team will reach out to the patient again over the next 30 days.  The patient has been provided with contact information for the Managed Medicaid care management team and has been advised to call with any health related questions or concerns.           Patient will attend appointments as scheduled.. Social Worker will follow up with patient in 30 days.   Rhonda Mcgee

## 2020-05-04 NOTE — Patient Outreach (Signed)
Medicaid Managed Care Social Work Note  05/04/2020 Name:  Rhonda Mcgee MRN:  [id number] DOB:  December 01, 1985  Rhonda Mcgee is an 35 y.o. year old female who is a primary patient of Patient, No Pcp Per.  The [facility] Managed Care Coordination team was consulted for assistance with:  PCP and OBGYN appointments  Ms. Rhonda Mcgee was given information about Medicaid Managed CareCoordination services today. [id number] Netter agreed to services and verbal consent obtained.  Engaged with patient  for by telephone forfollow up visit in response to referral for case management and/or care coordination services.   Assessments/Interventions:  Review of past medical history, allergies, medications, health status, including review of consultants reports, laboratory and other test data, was performed as part of comprehensive evaluation and provision of chronic care management services.  SDOH: (Social Determinant of Health) assessments and interventions performed: BSW scheduled patient for new patient appointment on 05/24/20 at [facility]pm [facility] at [address] [place], [phone].  BSW schedule patient an appointment at [facility] on 06/13/20 at 3:15pm, at [address], [address] Floor, [place] [phone].   Advanced Directives Status:  See Care Plan for related entries  Care Plan                 Allergies  Allergen Reactions  . Cephalexin Hives  . Keflex [Cephalexin] Hives    Medications Reviewed Today    Reviewed by Danie Chandler, RN (Registered Nurse) on 05/02/20 at 1535  Med List Status: <None>  Medication Order Taking? Sig Documenting Provider Last Dose Status Informant  dextromethorphan-guaiFENesin (MUCINEX DM) 30-600 MG 12hr tablet [id number] No Take 1 tablet by mouth 2 (two) times daily.  Patient not taking: Reported on 05/02/2020   Dartha Lodge, PA-C Not Taking Active   hydroxychloroquine (PLAQUENIL) 200 MG tablet [id number] Yes Take 200 mg by  mouth daily.  [provider] Taking Active   hydrOXYzine (ATARAX/VISTARIL) 25 MG tablet [id number] No Take 1 tablet (25 mg total) by mouth every 6 (six) hours.  Patient not taking: Reported on 05/02/2020   Jeannie Fend, PA-C Not Taking Active   ibuprofen (ADVIL) 800 MG tablet [id number] No Take 1 tablet (800 mg total) by mouth 3 (three) times daily.  Patient not taking: Reported on 05/02/2020   Dartha Lodge, PA-C Not Taking Active   omeprazole (PRILOSEC) 40 MG capsule [id number]  Take 1 capsule (40 mg total) by mouth 2 (two) times daily. Fulp, Cammie, MD  Expired 01/02/19 2359   ondansetron (ZOFRAN) 4 MG tablet [id number] No Take 1 tablet (4 mg total) by mouth every 8 (eight) hours as needed for nausea or vomiting.  Patient not taking: Reported on 05/02/2020   Cain Saupe, MD Not Taking Active           Patient Active Problem List   Diagnosis Date Noted  . Abdominal pain, epigastric 12/04/2018  . GERD (gastroesophageal reflux disease) 11/10/2018  . Gastritis 11/10/2018  . NSAID induced gastritis 11/10/2018  . Intractable nausea and vomiting 11/09/2018  . Lupus (HCC)   . Hypokalemia   . Vomiting     Conditions to be addressed/monitored per PCP order:  PCP and OBGYN  Patient Care Plan: General Plan of Care (Adult)    Problem Identified: Health Promotion or Disease Self-Management (General Plan of Care)   Priority: High  Onset Date: 05/02/2020  Note:   Current Barriers:  . Care Coordination needs related to Lupus and heavy bleeding with  periods requriing pad and tampon change every 30 minutes.  Patient is fatigued and feels like she could sleep a long time. . Chronic Disease Management support and education needs related to Lupus.  Nurse Case Manager Clinical Goal(s):  Marland Kitchen Over the next 30 days, patient will work with Managed Medicaid team  to address needs related to Lupus and heavy bleeding.   . Over the next 30 days, patient will attend all scheduled medical appointments:    Interventions:  . Inter-disciplinary care team collaboration (see longitudinal plan of care) . Collaborated with BSW regarding PCP referral.  Patient states she has recently started a new job and plans on obtaining insurance with her new place of employment when she can. . Discussed plans with patient for ongoing care management follow up and provided patient with direct contact information for care management team  Patient Goals/Self-Care Activities Over the next 30 days, patient will:  -Attends all scheduled provider appointments  Follow Up Plan: The Managed Medicaid care management team will reach out to the patient again over the next 30 days.  The patient has been provided with contact information for the Managed Medicaid care management team and has been advised to call with any health related questions or concerns.         Follow up:  Patient agrees to Care Plan and Follow-up.  Plan: The Managed Medicaid care management team will reach out to the patient again over the next 30 days.  Date/time of next scheduled Social Work care management/care coordination outreach:  06/05/2020 at 3:15  Gus Puma, BSW, MHA [facility]  [place]  High Risk Managed Medicaid Team

## 2020-05-22 ENCOUNTER — Emergency Department (HOSPITAL_COMMUNITY)
Admission: EM | Admit: 2020-05-22 | Discharge: 2020-05-22 | Disposition: A | Discharge status: Home / Self Care | Payer: BC Managed Care – PPO | Attending: Emergency Medicine | Admitting: Emergency Medicine

## 2020-05-22 ENCOUNTER — Other Ambulatory Visit: Payer: Self-pay

## 2020-05-22 ENCOUNTER — Encounter (HOSPITAL_COMMUNITY): Payer: Self-pay | Admitting: *Deleted

## 2020-05-22 ENCOUNTER — Emergency Department (HOSPITAL_COMMUNITY): Payer: BC Managed Care – PPO

## 2020-05-22 DIAGNOSIS — Z20822 Contact with and (suspected) exposure to covid-19: Secondary | ICD-10-CM | POA: Insufficient documentation

## 2020-05-22 DIAGNOSIS — B349 Viral infection, unspecified: Secondary | ICD-10-CM | POA: Diagnosis not present

## 2020-05-22 DIAGNOSIS — R059 Cough, unspecified: Secondary | ICD-10-CM | POA: Diagnosis not present

## 2020-05-22 DIAGNOSIS — F17210 Nicotine dependence, cigarettes, uncomplicated: Secondary | ICD-10-CM | POA: Insufficient documentation

## 2020-05-22 DIAGNOSIS — R109 Unspecified abdominal pain: Secondary | ICD-10-CM | POA: Insufficient documentation

## 2020-05-22 DIAGNOSIS — R509 Fever, unspecified: Secondary | ICD-10-CM | POA: Diagnosis not present

## 2020-05-22 DIAGNOSIS — M549 Dorsalgia, unspecified: Secondary | ICD-10-CM | POA: Diagnosis not present

## 2020-05-22 LAB — I-STAT BETA HCG BLOOD, ED (MC, WL, AP ONLY): I-stat hCG, quantitative: <5 m[IU]/mL (ref <5)

## 2020-05-22 LAB — CBC
HCT: 36.2 % (ref 36.0–46.0)
Hemoglobin: 11.1 g/dL — ABNORMAL LOW (ref 12.0–15.0)
MCH: 25.3 pg — ABNORMAL LOW (ref 26.0–34.0)
MCHC: 30.7 g/dL (ref 30.0–36.0)
MCV: 82.5 fL (ref 80.0–100.0)
Platelets: 310 10*3/uL (ref 150–400)
RBC: 4.39 MIL/uL (ref 3.87–5.11)
RDW: 16.7 % — ABNORMAL HIGH (ref 11.5–15.5)
WBC: 5.7 10*3/uL (ref 4.0–10.5)
nRBC: 0 % (ref 0.0–0.2)

## 2020-05-22 LAB — COMPREHENSIVE METABOLIC PANEL
ALT: 19 U/L (ref 0–44)
AST: 19 U/L (ref 15–41)
Albumin: 3.5 g/dL (ref 3.5–5.0)
Alkaline Phosphatase: 75 U/L (ref 38–126)
Anion gap: 11 (ref 5–15)
BUN: 10 mg/dL (ref 6–20)
CO2: 20 mmol/L — ABNORMAL LOW (ref 22–32)
Calcium: 9 mg/dL (ref 8.9–10.3)
Chloride: 108 mmol/L (ref 98–111)
Creatinine, Ser: 1.01 mg/dL — ABNORMAL HIGH (ref 0.44–1.00)
GFR, Estimated: >60 mL/min (ref >60)
Glucose, Bld: 66 mg/dL — ABNORMAL LOW (ref 70–99)
Potassium: 3.9 mmol/L (ref 3.5–5.1)
Sodium: 139 mmol/L (ref 135–145)
Total Bilirubin: 0.6 mg/dL (ref 0.3–1.2)
Total Protein: 7.1 g/dL (ref 6.5–8.1)

## 2020-05-22 LAB — URINALYSIS, ROUTINE W REFLEX MICROSCOPIC
Bilirubin Urine: NEGATIVE
Glucose, UA: NEGATIVE mg/dL
Hgb urine dipstick: NEGATIVE
Ketones, ur: NEGATIVE mg/dL
Leukocytes,Ua: NEGATIVE
Nitrite: NEGATIVE
Protein, ur: NEGATIVE mg/dL
Specific Gravity, Urine: 1.018 (ref 1.005–1.030)
pH: 5 (ref 5.0–8.0)

## 2020-05-22 LAB — RESPIRATORY PANEL BY PCR

## 2020-05-22 LAB — CBG MONITORING, ED
Glucose-Capillary: 69 mg/dL — ABNORMAL LOW (ref 70–99)
Glucose-Capillary: 112 mg/dL — ABNORMAL HIGH (ref 70–99)
Glucose-Capillary: 85 mg/dL (ref 70–99)

## 2020-05-22 LAB — LIPASE, BLOOD: Lipase: 35 U/L (ref 11–51)

## 2020-05-22 LAB — SARS CORONAVIRUS 2 (TAT 6-24 HRS): SARS Coronavirus 2: NEGATIVE

## 2020-05-22 MED ORDER — IBUPROFEN 800 MG PO TABS: 800.0000 mg | ORAL_TABLET | Freq: Three times a day (TID) | ORAL | 0 refills | Status: DC | PRN

## 2020-05-22 MED ORDER — HYDROCODONE-ACETAMINOPHEN 5-325 MG PO TABS
1.0000 | ORAL_TABLET | Freq: Once | ORAL | Status: AC
  Administered 2020-05-22: 1 via ORAL
  Filled 2020-05-22: qty 1

## 2020-05-22 NOTE — ED Notes (Signed)
Patient transported to X-ray 
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
## 2020-05-22 NOTE — ED Notes (Signed)
Pt did not respond to re-assessment of vitals

## 2020-05-22 NOTE — Discharge Instructions (Signed)
Follow-up with your family doctor as planned.  Drink plenty of fluids and rest

## 2020-05-22 NOTE — ED Notes (Signed)
This NT was approached by phlebotomy staff, who was informed by [employer] that pt was requesting assistance. This NT checked pts CBG once prior per pts request. This NT obtained second CBG at 1812 per pts request. Pt stated "85 is low". This NT asked pt if they would like anything to eat or drink to help feel better. Pt stated "I want to see the doctor" and verbalized that she did not want food or drink. This NT explained to pt that we did not have any exam rooms open but that she was longest wait. Pt then told this NT that "you better go find a room".

## 2020-05-22 NOTE — ED Notes (Signed)
md at bedside

## 2020-05-22 NOTE — ED Notes (Signed)
Pt provided [place] sandwich meal at this time. NADN.

## 2020-05-22 NOTE — ED Provider Notes (Signed)
[facility] EMERGENCY DEPARTMENT Provider Note   CSN: [id number] Arrival date & time: 05/22/20  3536     History Chief Complaint  Patient presents with  . Fever  . Abdominal Pain    Rhonda Mcgee is a 35 y.o. female.  Patient complains of back pain and cough for a few days.  No fever  The history is provided by the patient and medical records. No language interpreter was used.  Cough Cough characteristics:  Non-productive Sputum characteristics:  Nondescript Severity:  Mild Onset quality:  Sudden Timing:  Constant Progression:  Waxing and waning Chronicity:  New Smoker: no   Context: not animal exposure   Relieved by:  Nothing Worsened by:  Nothing Ineffective treatments:  None tried Associated symptoms: no chest pain, no eye discharge, no headaches and no rash        Past Medical History:  Diagnosis Date  . Chlamydia   . GERD (gastroesophageal reflux disease)   . Gonorrhea   . Herpes genitalia   . Lupus ([place])   . Rheumatoid arthritis(714.0)     Patient Active Problem List   Diagnosis Date Noted  . Abdominal pain, epigastric 12/04/2018  . GERD (gastroesophageal reflux disease) 11/10/2018  . Gastritis 11/10/2018  . NSAID induced gastritis 11/10/2018  . Intractable nausea and vomiting 11/09/2018  . Lupus ([place])   . Hypokalemia   . Vomiting     Past Surgical History:  Procedure Laterality Date  . ADENOIDECTOMY    . CHOLECYSTECTOMY    . DILATION AND CURETTAGE OF UTERUS     elective ab  . GALLBLADDER SURGERY    . TONSILLECTOMY    . TUBAL LIGATION       OB History    Gravida  4   Para      Term      Preterm      AB  2   Living  2     SAB  1   IAB  1   Ectopic      Multiple      Live Births  2        Obstetric Comments  SVD x 2. SAB x 1. EAB x 1        Family History  Adopted: Yes  Problem Relation Age of Onset  . Diabetes Mother   . Hypertension Mother   . Diabetes Sister   . Hypertension  Sister   . Lupus Maternal Grandmother   . Esophageal cancer Neg Hx   . Rectal cancer Neg Hx   . Stomach cancer Neg Hx   . Colon cancer Neg Hx     Social History   Tobacco Use  . Smoking status: Current Some Day Smoker    Types: Cigarettes  . Smokeless tobacco: Never Used  . Tobacco comment: trying to quit, no smoking for last 2 weeks  Vaping Use  . Vaping Use: Never used  Substance Use Topics  . Alcohol use: Yes    Comment: occasional  . Drug use: No    Home Medications Prior to Admission medications   Medication Sig Start Date End Date Taking? Authorizing Provider  ibuprofen (ADVIL) 800 MG tablet Take 1 tablet (800 mg total) by mouth every 8 (eight) hours as needed for moderate pain. 05/22/20  Yes Milton Ferguson, MD  dextromethorphan-guaiFENesin [facility] DM) 30-600 MG 12hr tablet Take 1 tablet by mouth 2 (two) times daily. Patient not taking: Reported on 05/02/2020 02/08/20   Jacqlyn Larsen, PA-C  hydroxychloroquine (PLAQUENIL) 200 MG tablet Take 200 mg by mouth daily.  11/12/18   [provider]  hydrOXYzine (ATARAX/VISTARIL) 25 MG tablet Take 1 tablet (25 mg total) by mouth every 6 (six) hours. Patient not taking: Reported on 05/02/2020 01/09/19   Army MeliaMurphy, Laura A, PA-C  ibuprofen (ADVIL) 800 MG tablet Take 1 tablet (800 mg total) by mouth 3 (three) times daily. Patient not taking: Reported on 05/02/2020 02/08/20   Dartha LodgeFord, Kelsey N, PA-C  omeprazole (PRILOSEC) 40 MG capsule Take 1 capsule (40 mg total) by mouth 2 (two) times daily. 12/03/18 01/02/19  Fulp, Cammie, MD  ondansetron (ZOFRAN) 4 MG tablet Take 1 tablet (4 mg total) by mouth every 8 (eight) hours as needed for nausea or vomiting. Patient not taking: Reported on 05/02/2020 12/03/18   Cain SaupeFulp, Cammie, MD    Allergies    Cephalexin and Keflex [cephalexin]  Review of Systems   Review of Systems  Constitutional: Negative for appetite change and fatigue.  HENT: Negative for congestion, ear discharge and sinus pressure.   Eyes:  Negative for discharge.  Respiratory: Positive for cough.   Cardiovascular: Negative for chest pain.  Gastrointestinal: Negative for abdominal pain and diarrhea.  Genitourinary: Negative for frequency and hematuria.  Musculoskeletal: Positive for back pain.  Skin: Negative for rash.  Neurological: Negative for seizures and headaches.  Psychiatric/Behavioral: Negative for hallucinations.    Physical Exam Updated Vital Signs BP 127/85   Pulse 74   Temp 99.1 F (37.3 C) (Oral)   Resp 20   Ht 5' (1.524 m)   Wt 97.5 kg   LMP 04/29/2020   SpO2 98%   BMI 41.99 kg/m   Physical Exam Vitals reviewed.  Constitutional:      Appearance: She is well-developed.  HENT:     Head: Normocephalic.     Nose: Nose normal.  Eyes:     General: No scleral icterus.    Extraocular Movements: EOM normal.     Conjunctiva/sclera: Conjunctivae normal.  Neck:     Thyroid: No thyromegaly.  Cardiovascular:     Rate and Rhythm: Normal rate and regular rhythm.     Heart sounds: No murmur heard. No friction rub. No gallop.   Pulmonary:     Breath sounds: No stridor. No wheezing or rales.  Chest:     Chest wall: No tenderness.  Abdominal:     General: There is no distension.     Tenderness: There is no abdominal tenderness. There is no rebound.  Musculoskeletal:        General: No edema. Normal range of motion.     Cervical back: Neck supple.     Comments: Mild tenderness left flank  Lymphadenopathy:     Cervical: No cervical adenopathy.  Skin:    Findings: No erythema or rash.  Neurological:     Mental Status: She is alert and oriented to person, place, and time.     Motor: No abnormal muscle tone.     Coordination: Coordination normal.  Psychiatric:        Mood and Affect: Mood and affect normal.        Behavior: Behavior normal.     ED Results / Procedures / Treatments   Labs (all labs ordered are listed, but only abnormal results are displayed) Labs Reviewed  COMPREHENSIVE  METABOLIC PANEL - Abnormal; Notable for the following components:      Result Value   CO2 20 (*)    Glucose, Bld 66 (*)  Creatinine, Ser 1.01 (*)    All other components within normal limits  CBC - Abnormal; Notable for the following components:   Hemoglobin 11.1 (*)    MCH 25.3 (*)    RDW 16.7 (*)    All other components within normal limits  URINALYSIS, ROUTINE W REFLEX MICROSCOPIC - Abnormal; Notable for the following components:   APPearance HAZY (*)    All other components within normal limits  CBG MONITORING, ED - Abnormal; Notable for the following components:   Glucose-Capillary 69 (*)    All other components within normal limits  CBG MONITORING, ED - Abnormal; Notable for the following components:   Glucose-Capillary 112 (*)    All other components within normal limits  SARS CORONAVIRUS 2 (TAT 6-24 HRS)  RESPIRATORY PANEL BY PCR  LIPASE, BLOOD  I-STAT BETA HCG BLOOD, ED ([facility], [facility], [facility] ONLY)  CBG MONITORING, ED    EKG None  Radiology DG ABD ACUTE 2+V W 1V CHEST  Result Date: 05/22/2020 CLINICAL DATA:  Cough, fever, and abdominal pain for 1 day. Current smoker. EXAM: DG ABDOMEN ACUTE WITH 1 VIEW CHEST COMPARISON:  Chest 03/03/2020.  CT abdomen and pelvis 02/08/2020 FINDINGS: Normal heart size and pulmonary vascularity. No focal airspace disease or consolidation in the lungs. No blunting of costophrenic angles. No pneumothorax. Mediastinal contours appear intact. Scattered gas and stool in the colon. No small or large bowel distention. No free intra-abdominal air. No abnormal air-fluid levels. No radiopaque stones. Visualized bones appear intact. Surgical clips in the right upper quadrant. Calcified phleboliths in the pelvis. IMPRESSION: No evidence of active pulmonary disease. Nonobstructive bowel gas pattern. Electronically Signed   By: Lucienne Capers M.D.   On: 05/22/2020 20:44    Procedures Procedures   Medications Ordered in ED Medications   HYDROcodone-acetaminophen (NORCO/VICODIN) 5-325 MG per tablet 1 tablet (1 tablet Oral Given 05/22/20 2057)    ED Course  I have reviewed the triage vital signs and the nursing notes.  Pertinent labs & imaging results that were available during my care of the patient were reviewed by me and considered in my medical decision making (see chart for details).    MDM Rules/Calculators/A&P                          Labs unremarkable Covid negative x-rays unremarkable.  Flu exam negative.  Suspect some type of viral syndrome causing operatory infection.  And back discomfort.  Patient given Motrin follow-up with PCP Final Clinical Impression(s) / ED Diagnoses Final diagnoses:  Viral syndrome    Rx / DC Orders ED Discharge Orders         Ordered    ibuprofen (ADVIL) 800 MG tablet  Every 8 hours PRN        05/22/20 2238           Milton Ferguson, MD 05/22/20 2241

## 2020-05-22 NOTE — ED Triage Notes (Signed)
Pt reports onset last week of lower abd pain and lower back pain. Denies urinary symptoms. Saturday pt began having congestion, headache, sore throat, fever.

## 2020-05-23 ENCOUNTER — Telehealth: Payer: Self-pay

## 2020-05-23 NOTE — Telephone Encounter (Signed)
Transition Care Management Unsuccessful Follow-up Telephone Call  Date of discharge and from where:  05/22/2020 [facility]  Attempts:  1st Attempt  Reason for unsuccessful TCM follow-up call:  Left voice message

## 2020-05-24 ENCOUNTER — Other Ambulatory Visit: Payer: Self-pay

## 2020-05-24 ENCOUNTER — Ambulatory Visit (INDEPENDENT_AMBULATORY_CARE_PROVIDER_SITE_OTHER): Payer: BC Managed Care – PPO | Admitting: Nurse Practitioner

## 2020-05-24 ENCOUNTER — Encounter: Payer: Self-pay | Admitting: Nurse Practitioner

## 2020-05-24 VITALS — BP 130/76 | HR 87 | Temp 98.1°F | Ht 65.5 in | Wt 180.6 lb

## 2020-05-24 DIAGNOSIS — M329 Systemic lupus erythematosus, unspecified: Secondary | ICD-10-CM

## 2020-05-24 DIAGNOSIS — E162 Hypoglycemia, unspecified: Secondary | ICD-10-CM | POA: Diagnosis not present

## 2020-05-24 DIAGNOSIS — Z7689 Persons encountering health services in other specified circumstances: Secondary | ICD-10-CM | POA: Diagnosis not present

## 2020-05-24 DIAGNOSIS — G43909 Migraine, unspecified, not intractable, without status migrainosus: Secondary | ICD-10-CM | POA: Diagnosis not present

## 2020-05-24 MED ORDER — AZITHROMYCIN 1 % OP SOLN: 1.0000 [drp] | Freq: Two times a day (BID) | OPHTHALMIC | 0 refills | Status: DC

## 2020-05-24 MED ORDER — CARBOXYMETHYLCELLUL-GLYCERIN 0.5-0.9 % OP SOLN: 1.0000 [drp] | Freq: Four times a day (QID) | OPHTHALMIC | 5 refills | Status: AC | PRN

## 2020-05-24 MED ORDER — SUMATRIPTAN SUCCINATE 25 MG PO TABS: 25.0000 mg | ORAL_TABLET | ORAL | 0 refills | Status: DC | PRN

## 2020-05-24 NOTE — Progress Notes (Signed)
[place], [place]  [address] Phone:  [phone]   Fax:  [phone]   New Patient Office Visit  Subjective:  Patient ID: Rhonda Mcgee, female    DOB: 08-Nov-1985  Age: 35 y.o. MRN: [id number]  CC:  Chief Complaint  Patient presents with  . New Patient (Initial Visit)    New patient ,  having low cbg , having headache, dizzness, nausea , liightness,hx of lupus  not taking medication because make sick , make tried and she can't get out of bed     HPI Rhonda Mcgee presents to establish care. She  has a past medical history of Chlamydia, GERD (gastroesophageal reflux disease), Gonorrhea, Herpes genitalia, Lupus ([place]), and Rheumatoid arthritis(714.0).  She was adopted. She admits that her mother passed in her 44's from heart disease.   She was diagnosed with hypoglycemia. She has seen values of 40-60. She does not eat. She has problems with decreased appetite. She has vomited which she felt like was the whole meal. She has been having this for sometime. She has been on Zofran in the past which was not very effective. She has been using the Bepto-bismol and this was effective. She had lost her insurance so she was no longer eligible to be seen at [facility]. She has now restored her insurance. So she needs to get her care restated. She was seen in the ER on Monday with increased fever. She admits that it has resolved.  She is currently working as a Quarry manager. She admits that she has been tested on several occasions for COVID and has not tested positive.   She has a history of possible SLE. Marland KitchenShe has been on Plaqunil for her lupus for one year however. She was having blurred vision. She had complained of the side effects. She feels like since being off this she is feeling a little better. She has a little more energy. Symptoms have been present for several years. . Symptoms include photosensitivity, arthritis, abdominal pain, fever, malaise, nausea,  headaches and bleeding gums and are of moderate and off and on severity.  Symptoms are made worse by: menstrual periods and sun and her job. 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   Associated symptoms include bleeding/clotting problems and mood swings and stress. Patient denies associated alopecia and memory loss. Patient is currently taking the following medications associate with SLE type syndrome: none. She admits that her maternal great grandmother also had lupus.    Past Medical History:  Diagnosis Date  . Chlamydia   . GERD (gastroesophageal reflux disease)   . Gonorrhea   . Herpes genitalia   . Lupus ([place])   . Rheumatoid arthritis(714.0)     Past Surgical History:  Procedure Laterality Date  . ADENOIDECTOMY    . CHOLECYSTECTOMY    . DILATION AND CURETTAGE OF UTERUS     elective ab  . GALLBLADDER SURGERY    . TONSILLECTOMY    . TUBAL LIGATION      Family History  Adopted: Yes  Problem Relation Age of Onset  . Diabetes Mother   . Hypertension Mother   . Diabetes Sister   . Hypertension Sister   . Lupus Maternal Grandmother   . Esophageal cancer Neg Hx   . Rectal cancer Neg Hx   . Stomach cancer Neg Hx   . Colon cancer Neg Hx     Social History   Socioeconomic  History  . Marital status: Legally Separated    Spouse name: Not on file  . Number of children: Not on file  . Years of education: Not on file  . Highest education level: Not on file  Occupational History  . Not on file  Tobacco Use  . Smoking status: Current Some Day Smoker    Types: Cigarettes  . Smokeless tobacco: Never Used  . Tobacco comment: trying to quit, no smoking for last 2 weeks  Vaping Use  . Vaping Use: Never used  Substance and Sexual Activity  . Alcohol use: Yes    Comment: occasional  . Drug use: No  . Sexual activity: Yes    Birth control/protection: None, Surgical  Other Topics Concern  . Not on file  Social History Narrative  . Not on file   Social  Determinants of Health   Financial Resource Strain: Not on file  Food Insecurity: Not on file  Transportation Needs: Not on file  Physical Activity: Not on file  Stress: Not on file  Social Connections: Not on file  Intimate Partner Violence: Not on file    ROS Review of Systems  Objective:   Today's Vitals: BP 130/76 (BP Location: Left Arm, Patient Position: Sitting, Cuff Size: Normal)   Pulse 87   Temp 98.1 F (36.7 C) (Temporal)   Ht 5' 5.5" (1.664 m)   Wt 180 lb 9.6 oz (81.9 kg)   LMP 04/29/2020   SpO2 99%   BMI 29.60 kg/m   Physical Exam Constitutional:      Appearance: She is normal weight.  HENT:     Head: Normocephalic and atraumatic.     Mouth/Throat:     Mouth: Mucous membranes are moist.  Cardiovascular:     Rate and Rhythm: Normal rate and regular rhythm.     Pulses: Normal pulses.     Heart sounds: Normal heart sounds.  Pulmonary:     Effort: Pulmonary effort is normal.     Breath sounds: Normal breath sounds.  Abdominal:     General: Bowel sounds are normal.     Palpations: Abdomen is soft.  Musculoskeletal:     Cervical back: Normal range of motion.  Skin:    General: Skin is warm.     Capillary Refill: Capillary refill takes less than 2 seconds.     Comments: Notable dark spots to his face.   Neurological:     General: No focal deficit present.     Mental Status: She is alert and oriented to person, place, and time.  Psychiatric:        Mood and Affect: Mood normal.        Behavior: Behavior normal.        Thought Content: Thought content normal.        Judgment: Judgment normal.     Assessment & Plan:   Problem List Items Addressed This Visit      Other   Lupus ([place]) Evaluation with referral   Relevant Orders   Ambulatory referral to Rheumatology    Other Visit Diagnoses    Encounter to establish care    -  Primary Discussed female health maintenance; SBE, annual CBE, PAP test Discussed general safety in vehicle and  COVID Discussed regular hydration with water Discussed healthy diet and exercise and weight management Discussed sexual health  Discussed mental health Encouraged to call our office for an appointment with in ongoing concerns for questions.    Migraine without status migrainosus,  not intractable, unspecified migraine type       Relevant Medications   SUMAtriptan (IMITREX) 25 MG tablet   Other Relevant Orders   Ambulatory referral to Neurology   Hypoglycemia     Encourage eating to maintain glycemic control       Outpatient Encounter Medications as of 05/24/2020  Medication Sig  . azithromycin (AZASITE) 1 % ophthalmic solution Place 1 drop into both eyes 2 (two) times daily.  . carboxymethylcellul-glycerin (REFRESH OPTIVE) 0.5-0.9 % ophthalmic solution Place 1 drop into both eyes 4 (four) times daily as needed for dry eyes.  . pantoprazole (PROTONIX) 40 MG tablet Take by mouth.  . SUMAtriptan (IMITREX) 25 MG tablet Take 1 tablet (25 mg total) by mouth every 2 (two) hours as needed for migraine. May repeat in 2 hours if headache persists or recurs.  . [DISCONTINUED] dextromethorphan-guaiFENesin (MUCINEX DM) 30-600 MG 12hr tablet Take 1 tablet by mouth 2 (two) times daily. (Patient not taking: Reported on 05/02/2020)  . [DISCONTINUED] hydroxychloroquine (PLAQUENIL) 200 MG tablet Take 200 mg by mouth daily.  (Patient not taking: Reported on 05/24/2020)  . [DISCONTINUED] hydrOXYzine (ATARAX/VISTARIL) 25 MG tablet Take 1 tablet (25 mg total) by mouth every 6 (six) hours. (Patient not taking: No sig reported)  . [DISCONTINUED] ibuprofen (ADVIL) 800 MG tablet Take 1 tablet (800 mg total) by mouth 3 (three) times daily. (Patient not taking: No sig reported)  . [DISCONTINUED] ibuprofen (ADVIL) 800 MG tablet Take 1 tablet (800 mg total) by mouth every 8 (eight) hours as needed for moderate pain. (Patient not taking: Reported on 05/24/2020)  . [DISCONTINUED] omeprazole (PRILOSEC) 40 MG capsule Take 1  capsule (40 mg total) by mouth 2 (two) times daily.  . [DISCONTINUED] ondansetron (ZOFRAN) 4 MG tablet Take 1 tablet (4 mg total) by mouth every 8 (eight) hours as needed for nausea or vomiting. (Patient not taking: No sig reported)   No facility-administered encounter medications on file as of 05/24/2020.    Follow-up: Return in about 3 months (around 08/22/2020) for [address].   [name], NP

## 2020-05-24 NOTE — Telephone Encounter (Signed)
Patient has an appointment with Crystal King,NP today 05/24/2020 1PM.

## 2020-05-27 ENCOUNTER — Encounter: Payer: Self-pay | Admitting: Nurse Practitioner

## 2020-05-30 ENCOUNTER — Other Ambulatory Visit: Payer: Self-pay | Admitting: Obstetrics and Gynecology

## 2020-05-30 NOTE — Patient Outreach (Signed)
Care Coordination  05/30/2020  Rhonda Mcgee 21-Jan-1986 [id number]    Medicaid Managed Care   Unsuccessful Outreach Note  05/30/2020 Name: Rhonda Mcgee MRN: [id number] DOB: Sep 07, 1985  Referred by: [name], NP Reason for referral : High Risk Managed Medicaid (Unsuccessful telephone outreach)   An unsuccessful telephone outreach was attempted today. The patient was referred to the case management team for assistance with care management and care coordination.   Follow Up Plan: A member of the Managed Medicaid care management team will reach out to the patient again over the next 7 days.   Aida Raider RN, BSN [place]  [facility] - Managed Medicaid High Risk [phone].

## 2020-05-30 NOTE — Patient Instructions (Signed)
Hi Rhonda Mcgee, sorry we missed you today  - as a part of your Medicaid benefit, you are eligible for care management and care coordination services at no cost or copay. I was unable to reach you by phone today but would be happy to help you with your health related needs. Please feel free to call me at [phone].  A member of the Managed Medicaid care management team will reach out to you again over the next 7 days.   Rhonda Raider RN, BSN [place]  [facility] - Managed Medicaid High Risk [phone].

## 2020-06-05 ENCOUNTER — Ambulatory Visit: Payer: Medicaid Other

## 2020-06-05 ENCOUNTER — Other Ambulatory Visit: Payer: Self-pay

## 2020-06-05 NOTE — Patient Instructions (Signed)
Visit Information  Rhonda Mcgee was given information about Medicaid Managed Care team care coordination services as a part of their [facility] Medicaid benefit. [name] verbally consented to engagement with the [facility] Managed Care team.   For questions related to your [facility] health plan, please call: [phone]  If you would like to schedule transportation through your [facility] plan, please call the following number at least 2 days in advance of your appointment: [phone]  [name] - following are the goals we discussed in your visit today:  Goals Addressed   None       Social Worker will follow up in 30 days.   [name]  Following is a copy of your plan of care:  Patient Care Plan: General Plan of Care (Adult)    Problem Identified: Health Promotion or Disease Self-Management (General Plan of Care) Resolved 05/30/2020  Priority: High  Onset Date: 05/02/2020  Note:   Current Barriers:  . Care Coordination needs related to Lupus and heavy bleeding with periods requriing pad and tampon change every 30 minutes.  Patient is fatigued and feels like she could sleep a long time. . Chronic Disease Management support and education needs related to Lupus.  Nurse Case Manager Clinical Goal(s):  Marland Kitchen Over the next 30 days, patient will work with Managed Medicaid team  to address needs related to Lupus and heavy bleeding.   . Over the next 30 days, patient will attend all scheduled medical appointments:   Interventions:  . Inter-disciplinary care team collaboration (see longitudinal plan of care) . Collaborated with BSW regarding PCP referral.  Patient states she has recently started a new job and plans on obtaining insurance with her new place of employment when she can. . Discussed plans with patient for ongoing care management follow up and provided patient with direct contact information for care management team  Patient Goals/Self-Care  Activities Over the next 30 days, patient will:  -Attends all scheduled provider appointments  Follow Up Plan: The Managed Medicaid care management team will reach out to the patient again over the next 30 days.  The patient has been provided with contact information for the Managed Medicaid care management team and has been advised to call with any health related questions or concerns.

## 2020-06-05 NOTE — Patient Outreach (Signed)
  Medicaid Managed Care Social Work Note  06/05/2020 Name:  Tylisa Alcivar MRN:  [id number] DOB:  [birth year]  Rhonda Mcgee is an 35 y.o. year old female who is a primary patient of [name], NP.  The [facility] Managed Care Coordination team was consulted for assistance with:  appointment scheduling  Ms. Peggs was given information about Medicaid Managed CareCoordination services today. [id number] Biskup agreed to services and verbal consent obtained.  Engaged with patient  for by telephone forfollow up visit in response to referral for case management and/or care coordination services.   Assessments/Interventions:  Review of past medical history, allergies, medications, health status, including review of consultants reports, laboratory and other test data, was performed as part of comprehensive evaluation and provision of chronic care management services.  SDOH: (Social Determinant of Health) assessments and interventions performed:  Patient stated she attending her new patient appointment with the PCP and it went well. She also received a referral for the neurologist. BSW reminded patient of her OB appointment coming up this month. Patient stated no other needs at this time   Advanced Directives Status:  Not addressed in this encounter.  Care Plan                 Allergies  Allergen Reactions  . Cephalexin Hives  . Keflex [Cephalexin] Hives    Medications Reviewed Today    Reviewed by [name], NP (Nurse Practitioner) on 05/27/20 at 1541  Med List Status: <None>  Medication Order Taking? Sig Documenting Provider Last Dose Status Informant  azithromycin (AZASITE) 1 % ophthalmic solution [id number] Yes Place 1 drop into both eyes 2 (two) times daily. [name], NP  Active   carboxymethylcellul-glycerin (REFRESH OPTIVE) 0.5-0.9 % ophthalmic solution [id number] Yes Place 1 drop into both eyes 4 (four) times daily as needed for dry eyes. [name], NP   Active   pantoprazole (PROTONIX) 40 MG tablet [id number] Yes Take by mouth. [provider]  Active   SUMAtriptan (IMITREX) 25 MG tablet [id number] Yes Take 1 tablet (25 mg total) by mouth every 2 (two) hours as needed for migraine. May repeat in 2 hours if headache persists or recurs. [name], NP  Active           Patient Active Problem List   Diagnosis Date Noted  . Abdominal pain, epigastric 12/04/2018  . GERD (gastroesophageal reflux disease) 11/10/2018  . Gastritis 11/10/2018  . NSAID induced gastritis 11/10/2018  . Intractable nausea and vomiting 11/09/2018  . Lupus ([place])   . Hypokalemia   . Vomiting     Conditions to be addressed/monitored per PCP order:  none  There are no care plans that you recently modified to display for this patient.   Follow up:  Patient agrees to Care Plan and Follow-up.  Plan: The Managed Medicaid care management team will reach out to the patient again over the next 30 days.  Date/time of next scheduled Social Work care management/care coordination outreach:  07/03/20  Mickel Fuchs, [place], [place]  High Risk Managed Medicaid Team

## 2020-06-07 DIAGNOSIS — H01125 Discoid lupus erythematosus of left lower eyelid: Secondary | ICD-10-CM | POA: Diagnosis not present

## 2020-06-07 DIAGNOSIS — Z79899 Other long term (current) drug therapy: Secondary | ICD-10-CM | POA: Diagnosis not present

## 2020-06-07 DIAGNOSIS — H16223 Keratoconjunctivitis sicca, not specified as Sjogren's, bilateral: Secondary | ICD-10-CM | POA: Diagnosis not present

## 2020-06-07 DIAGNOSIS — H01122 Discoid lupus erythematosus of right lower eyelid: Secondary | ICD-10-CM | POA: Diagnosis not present

## 2020-06-13 ENCOUNTER — Ambulatory Visit (INDEPENDENT_AMBULATORY_CARE_PROVIDER_SITE_OTHER): Payer: Medicaid Other | Admitting: Certified Nurse Midwife

## 2020-06-13 ENCOUNTER — Encounter: Payer: Self-pay | Admitting: Certified Nurse Midwife

## 2020-06-13 ENCOUNTER — Other Ambulatory Visit: Payer: Self-pay

## 2020-06-13 ENCOUNTER — Other Ambulatory Visit (HOSPITAL_COMMUNITY)
Admission: RE | Admit: 2020-06-13 | Discharge: 2020-06-13 | Disposition: A | Discharge status: Home / Self Care | Payer: BC Managed Care – PPO | Source: Ambulatory Visit | Attending: Certified Nurse Midwife | Admitting: Certified Nurse Midwife

## 2020-06-13 VITALS — BP 121/86 | HR 80 | Ht 65.5 in | Wt 209.6 lb

## 2020-06-13 DIAGNOSIS — Z113 Encounter for screening for infections with a predominantly sexual mode of transmission: Secondary | ICD-10-CM

## 2020-06-13 DIAGNOSIS — N760 Acute vaginitis: Secondary | ICD-10-CM | POA: Diagnosis not present

## 2020-06-13 DIAGNOSIS — B9689 Other specified bacterial agents as the cause of diseases classified elsewhere: Secondary | ICD-10-CM

## 2020-06-13 DIAGNOSIS — N939 Abnormal uterine and vaginal bleeding, unspecified: Secondary | ICD-10-CM | POA: Diagnosis not present

## 2020-06-13 DIAGNOSIS — Z01419 Encounter for gynecological examination (general) (routine) without abnormal findings: Secondary | ICD-10-CM | POA: Diagnosis not present

## 2020-06-13 MED ORDER — LO LOESTRIN FE 1 MG-10 MCG / 10 MCG PO TABS
1.0000 | ORAL_TABLET | Freq: Every day | ORAL | 3 refills | Status: DC
Start: 2020-06-13 — End: 2020-11-22

## 2020-06-13 NOTE — Progress Notes (Signed)
GYNECOLOGY ANNUAL PREVENTATIVE CARE ENCOUNTER NOTE  History:     Rhonda Mcgee is a 35 y.o. [phone] female here for a routine annual gynecologic exam.  Current complaints: consistent 5/10 abdominal pain, heavy menses, intermittent spotting, generalized dizziness, and thin/white odorous vaginal discharge. She states her periods have been heavy for the first 2 days, and has noted increasing dizziness after heavy flow as well as an odorous vaginal discharge after her menses. She reports having intense cravings for ice. Denies problems with intercourse or other gynecologic concerns.    Gynecologic History Patient's last menstrual period was 05/29/2020 (approximate). Contraception: Previous bilateral tubal ligation Last Pap: 12/30/2018 Results were: normal with negative HPV  Obstetric History OB History  Gravida Para Term Preterm AB Living  4       2 2   SAB IAB Ectopic Multiple Live Births  1 1     2     # Outcome Date GA Lbr Len/2nd Weight Sex Delivery Anes PTL Lv  4 Gravida           3 Gravida           2 SAB           1 IAB             Obstetric Comments  SVD x 2. SAB x 1. EAB x 1    Past Medical History:  Diagnosis Date  . Chlamydia   . GERD (gastroesophageal reflux disease)   . Gonorrhea   . Herpes genitalia   . Lupus ([place])   . Rheumatoid arthritis(714.0)     Past Surgical History:  Procedure Laterality Date  . ADENOIDECTOMY    . CHOLECYSTECTOMY    . DILATION AND CURETTAGE OF UTERUS     elective ab  . GALLBLADDER SURGERY    . TONSILLECTOMY    . TUBAL LIGATION      Current Outpatient Medications on File Prior to Visit  Medication Sig Dispense Refill  . azithromycin (AZASITE) 1 % ophthalmic solution Place 1 drop into both eyes 2 (two) times daily. 2.5 mL 0  . carboxymethylcellul-glycerin (REFRESH OPTIVE) 0.5-0.9 % ophthalmic solution Place 1 drop into both eyes 4 (four) times daily as needed for dry eyes. 15 mL 5  . pantoprazole (PROTONIX) 40 MG tablet Take by  mouth.    . SUMAtriptan (IMITREX) 25 MG tablet Take 1 tablet (25 mg total) by mouth every 2 (two) hours as needed for migraine. May repeat in 2 hours if headache persists or recurs. (Patient not taking: Reported on 06/13/2020) 10 tablet 0   No current facility-administered medications on file prior to visit.    Allergies  Allergen Reactions  . Cephalexin Hives  . Keflex [Cephalexin] Hives    Social History:  reports that she has been smoking cigarettes. She has never used smokeless tobacco. She reports current alcohol use. She reports that she does not use drugs.  Family History  Adopted: Yes  Problem Relation Age of Onset  . Diabetes Mother   . Hypertension Mother   . Diabetes Sister   . Hypertension Sister   . Lupus Maternal Grandmother   . Esophageal cancer Neg Hx   . Rectal cancer Neg Hx   . Stomach cancer Neg Hx   . Colon cancer Neg Hx     The following portions of the patient's history were reviewed and updated as appropriate: allergies, current medications, past family history, past medical history, past social history, past surgical history and problem list.  Review of Systems Pertinent items noted in HPI and remainder of comprehensive ROS otherwise negative.  Physical Exam:  BP 121/86   Pulse 80   Ht 5' 5.5" (1.664 m)   Wt 209 lb 9.6 oz (95.1 kg)   LMP 05/29/2020 (Approximate)   BMI 34.35 kg/m  CONSTITUTIONAL: Well-developed, well-nourished female in no acute distress.  HENT:  Normocephalic, atraumatic EYES: Conjunctivae and EOM are normal. Pupils are equal, round, and reactive to light. No scleral icterus.  NECK: Normal range of motion, supple, no masses.  Normal thyroid.  SKIN: Skin is warm and dry. No rash noted. Not diaphoretic. No erythema. No pallor. MUSCULOSKELETAL: Normal range of motion. No tenderness.  No cyanosis, clubbing, or edema. NEUROLOGIC: Alert and oriented to person, place, and time.  PSYCHIATRIC: Normal mood and affect. Normal behavior.  Normal judgment and thought content. CARDIOVASCULAR: Normal heart rate noted, regular rhythm, S1/S2 noted RESPIRATORY: Clear to auscultation bilaterally. Effort and breath sounds normal, no problems with respiration noted. BREASTS: Symmetric in size. Fibrous tissue present. Diffuse tenderness to palpation present. No masses, skin changes, nipple drainage, or lymphadenopathy bilaterally. Performed in the presence of a chaperone. ABDOMEN: Soft, no distention noted. Tenderness noted in LLQ and above pubic symphysis. No rebound or guarding.  PELVIC: Normal appearing external genitalia and urethral meatus; normal appearing vaginal mucosa.  Thin white discharge noted. Performed in the presence of a chaperone.   Assessment and Plan:    1. Women's annual routine gynecological examination - Fibrous breast tissue and tenderness noted - Swab for BV obtained  2. Abnormal uterine bleeding (AUB) - CBC - US Pelvis - ordered -Patient prescribed Lo Loestrin Fe due to AUB, fibrotic pain, and potential iron deficiency anemia, will confirm with CBC results -LO LOESTRIN FE 1 MG-10 MCG / 10 MCG tablet; Take 1 tablet by mouth daily.  Dispense: 90 tablet; Refill: 3  3. Screening for STDs (sexually transmitted diseases) - HIV Antibody (routine testing w rflx) - RPR - Hepatitis C Antibody - Hepatitis B Surface AntiGEN - Cervicovaginal ancillary only( [place])  Will follow up results of labs and ultrasound and manage accordingly. Routine preventative health maintenance measures emphasized. Please refer to After Visit Summary for other counseling recommendations.       Wendall Papa, [place] for [employer], [place]

## 2020-06-13 NOTE — Patient Instructions (Signed)
Uterine Fibroids  Uterine fibroids, also called leiomyomas, are noncancerous (benign) tumors that can grow in the uterus. They can cause heavy menstrual bleeding and pain. Fibroids may also grow in the fallopian tubes, cervix, or tissues (ligaments) near the uterus. You may have one or many fibroids. Fibroids vary in size, weight, and where they grow in the uterus. Some can become quite large. Most fibroids do not require medical treatment. What are the causes? The cause of this condition is not known. What increases the risk? You are more likely to develop this condition if you:  Are in your 30s or 40s and have not gone through menopause.  Have a family history of this condition.  Are of African American descent.  Started your menstrual period at age 102 or younger.  Have never given birth.  Are overweight or obese. What are the signs or symptoms? Many women do not have any symptoms. Symptoms of this condition may include:  Heavy menstrual bleeding.  Bleeding between menstrual periods.  Pain and pressure in the pelvic area, between your hip bones.  Pain during sex.  Bladder problems, such as needing to urinate right away or more often than usual.  Inability to have children (infertility).  Failure to carry pregnancy to term (miscarriage). How is this diagnosed? This condition may be diagnosed based on:  Your symptoms and medical history.  A physical exam.  A pelvic exam that includes feeling for any tumors.  Imaging tests, such as ultrasound or MRI. How is this treated? Treatment for this condition may include follow-up visits with your health care provider to monitor your fibroids for any changes. Other treatment may include:  Medicines, such as: ? Medicines to relieve pain, including aspirin and NSAIDs, such as ibuprofen or naproxen. ? Hormone therapy. Treatment may be given as a pill or an injection, or it may be inserted into the uterus using an intrauterine  device (IUD).  Surgery that would do one of the following: ? Remove the fibroids (myomectomy). This may be recommended if fibroids affect your fertility and you want to become pregnant. ? Remove the uterus (hysterectomy). ? Block the blood supply to the fibroids (uterine artery embolization). This can cause them to shrink and die. Follow these instructions at home: Medicines  Take over-the-counter and prescription medicines only as told by your health care provider.  Ask your health care provider if you should take iron pills or eat more iron-rich foods, such as dark green, leafy vegetables. Heavy menstrual bleeding can cause low iron levels. Managing pain If directed, apply heat to your back or abdomen to reduce pain. Use the heat source that your health care provider recommends, such as a moist heat pack or a heating pad. To apply heat:  Place a towel between your skin and the heat source.  Leave the heat on for 20-30 minutes.  Remove the heat if your skin turns bright red. This is especially important if you are unable to feel pain, heat, or cold. You may have a greater risk of getting burned.   General instructions  Pay close attention to your menstrual cycle. Tell your health care provider about any changes, such as: ? Heavier bleeding that requires you to change your pads or tampons more than usual. ? A change in the number of days that your menstrual period lasts. ? A change in symptoms that come with your menstrual period, such as back pain or cramps in your abdomen.  Keep all follow-up visits. This is  important, especially if your fibroids need to be monitored for any changes. Contact a health care provider if you:  Have pelvic pain, back pain, or cramps in your abdomen that do not get better with medicine or heat.  Develop new bleeding between menstrual periods.  Have increased bleeding during or between menstrual periods.  Feel more tired or weak than usual.  Feel  light-headed. Get help right away if you:  Faint.  Have pelvic pain that suddenly gets worse.  Have severe vaginal bleeding that soaks a tampon or pad in 30 minutes or less. Summary  Uterine fibroids are noncancerous (benign) tumors that can develop in the uterus.  The exact cause of this condition is not known.  Most fibroids do not require medical treatment unless they affect your ability to have children (fertility).  Contact a health care provider if you have pelvic pain, back pain, or cramps in your abdomen that do not get better with medicines.  Get help right away if you faint, have pelvic pain that suddenly gets worse, or have severe vaginal bleeding. This information is not intended to replace advice given to you by your health care provider. Make sure you discuss any questions you have with your health care provider. Document Revised: 11/16/2019 Document Reviewed: 11/16/2019 Elsevier Patient Education  [place].

## 2020-06-14 LAB — CERVICOVAGINAL ANCILLARY ONLY
Bacterial Vaginitis (gardnerella): POSITIVE — AB
Candida Glabrata: NEGATIVE
Candida Vaginitis: NEGATIVE
Chlamydia: NEGATIVE
Comment: NEGATIVE
Comment: NEGATIVE
Comment: NEGATIVE
Comment: NEGATIVE
Comment: NEGATIVE
Comment: NORMAL
Neisseria Gonorrhea: NEGATIVE
Trichomonas: NEGATIVE

## 2020-06-15 LAB — CBC
Hematocrit: 36.3 % (ref 34.0–46.6)
Hemoglobin: 11.2 g/dL (ref 11.1–15.9)
MCH: 24.8 pg — ABNORMAL LOW (ref 26.6–33.0)
MCHC: 30.9 g/dL — ABNORMAL LOW (ref 31.5–35.7)
MCV: 81 fL (ref 79–97)
Platelets: 305 10*3/uL (ref 150–450)
RBC: 4.51 x10E6/uL (ref 3.77–5.28)
RDW: 15.8 % — ABNORMAL HIGH (ref 11.7–15.4)
WBC: 5.9 10*3/uL (ref 3.4–10.8)

## 2020-06-15 LAB — HEPATITIS B SURFACE ANTIGEN: Hepatitis B Surface Ag: NEGATIVE

## 2020-06-15 LAB — RPR, QUANT+TP ABS (REFLEX)
Rapid Plasma Reagin, Quant: 1:1 {titer} — ABNORMAL HIGH
T Pallidum Abs: NONREACTIVE

## 2020-06-15 LAB — RPR: RPR Ser Ql: REACTIVE — AB

## 2020-06-15 LAB — HEPATITIS C ANTIBODY: Hep C Virus Ab: <0.1 s/co ratio (ref 0.0–0.9)

## 2020-06-15 LAB — HIV ANTIBODY (ROUTINE TESTING W REFLEX): HIV Screen 4th Generation wRfx: NONREACTIVE

## 2020-06-17 MED ORDER — CLINDAMYCIN HCL 300 MG PO CAPS: 300.0000 mg | ORAL_CAPSULE | Freq: Two times a day (BID) | ORAL | 0 refills | Status: DC

## 2020-06-17 NOTE — Addendum Note (Signed)
Addended by: Lajean Manes on: 06/17/2020 10:22 PM   Modules accepted: Orders

## 2020-06-21 ENCOUNTER — Other Ambulatory Visit: Payer: Self-pay

## 2020-06-21 ENCOUNTER — Other Ambulatory Visit: Payer: Self-pay | Admitting: Obstetrics and Gynecology

## 2020-06-21 NOTE — Patient Outreach (Signed)
Medicaid Managed Care   Nurse Care Manager Note  06/21/2020 Name:  Rhonda Mcgee MRN:  [id number] DOB:  Jul 08, 1985  Rhonda Mcgee is an 35 y.o. year old female who is a primary patient of [name], NP.  The [facility] Managed Care Coordination team was consulted for assistance with:    chronic healthcare management needs.  Rhonda Mcgee was given information about Medicaid Managed Care Coordination team services today. Rhonda Mcgee agreed to services and verbal consent obtained.  Engaged with patient by telephone for follow up visit in response to provider referral for case management and/or care coordination services.   Assessments/Interventions:  Review of past medical history, allergies, medications, health status, including review of consultants reports, laboratory and other test data, was performed as part of comprehensive evaluation and provision of chronic care management services.  SDOH (Social Determinants of Health) assessments and interventions performed:   Care Plan  Allergies  Allergen Reactions  . Cephalexin Hives  . Keflex [Cephalexin] Hives    Medications Reviewed Today    Reviewed by Gayla Medicus, RN (Registered Nurse) on 06/21/20 at 1441  Med List Status: <None>  Medication Order Taking? Sig Documenting Provider Last Dose Status Informant  azithromycin (AZASITE) 1 % ophthalmic solution [id number] Yes Place 1 drop into both eyes 2 (two) times daily. [name], NP Taking Active   carboxymethylcellul-glycerin (REFRESH OPTIVE) 0.5-0.9 % ophthalmic solution [id number] No Place 1 drop into both eyes 4 (four) times daily as needed for dry eyes.  Patient not taking: Reported on 06/21/2020   [name], NP Not Taking Active   clindamycin (CLEOCIN) 300 MG capsule [id number] Yes Take 1 capsule (300 mg total) by mouth 2 (two) times daily. For seven days Lajean Manes, CNM Taking Active   LO LOESTRIN FE 1 MG-10 MCG / 10 MCG tablet [id number] Yes  Take 1 tablet by mouth daily. Lajean Manes, CNM Taking Active   pantoprazole (PROTONIX) 40 MG tablet [id number] Yes Take by mouth. [provider] Taking Active   SUMAtriptan (IMITREX) 25 MG tablet [id number] No Take 1 tablet (25 mg total) by mouth every 2 (two) hours as needed for migraine. May repeat in 2 hours if headache persists or recurs.  Patient not taking: No sig reported   [name], NP Not Taking Active           Patient Active Problem List   Diagnosis Date Noted  . Abdominal pain, epigastric 12/04/2018  . GERD (gastroesophageal reflux disease) 11/10/2018  . Gastritis 11/10/2018  . NSAID induced gastritis 11/10/2018  . Intractable nausea and vomiting 11/09/2018  . Lupus ([place])   . Hypokalemia   . Vomiting     Conditions to be addressed/monitored per PCP order:  chronic healthcare management needs, Lupus, RA, GERD, AUB, H/A  Care Plan : General Plan of Care (Adult)  Updates made by Gayla Medicus, RN since 06/21/2020 12:00 AM      Long-Range Goal: Self-Management Plan Developed   Start Date: 05/02/2020  Expected End Date: 09/18/2020  This Visit's Progress: On track  Priority: Medium  Note:       Problem Identified: Health Promotion or Disease Self-Management (General Plan of Care)    Priority: High  Onset Date: 05/02/2020  Note:   Current Barriers:   Care Coordination needs related to Lupus and heavy bleeding with periods requriing pad and tampon change every 30 minutes.  Patient is fatigued and feels like she could sleep a long time.  Chronic Disease Management support and education needs related to Lupus, RA   Nurse Case Manager Clinical Goal(s):   Over the next 30 days, patient will work with Managed Medicaid team  to address needs related to Lupus and heavy bleeding, headaches. Update 06/21/20:  Patient has been evaluated by PCP and OB/GYN provider.  Has follow up appointments with pelvic ultrasound scheduled.  Over the next 30 days, patient  will attend all scheduled medical appointments:    Interventions:   Inter-disciplinary care team collaboration (see longitudinal plan of care)  Collaborated with BSW regarding PCP referral.  Patient states she has recently started a new job and plans on obtaining insurance with her new place of employment when she can.  Update 06/21/20:  Patient has established with Rushie Chestnut, NP.  Discussed plans with patient for ongoing care management follow up and provided patient with direct contact information for care management team   Patient Goals/Self-Care Activities Over the next 30 days, patient will:  -Attends all scheduled provider appointments   Follow Up Plan: The Managed Medicaid care management team will reach out to the patient again over the next 30 days.  The patient has been provided with contact information for the Managed Medicaid care management team and has been advised to call with any health related questions or concerns.          Evidence-based guidance:   Review biopsychosocial determinants of health screens.   Review need for preventive screening based on age, sex, family history and health history.   Determine level of modifiable health risk.   Discuss identified risks.   Identify areas where behavior change may lead to improved health.   Promote healthy lifestyle.   Evoke change talk using open-ended questions, pros and cons, as well as looking forward.   Identify and manage conditions or preconditions to reduce health risk.   Implement additional goals and interventions based on identified risk factors.       Follow Up:  Patient agrees to Care Plan and Follow-up.  Plan: The Managed Medicaid care management team will reach out to the patient again over the next 30 days. and The patient has been provided with contact information for the Managed Medicaid care management team and has been advised to call with any health related questions or  concerns.  Date/time of next scheduled RN care management/care coordination outreach: 07/19/20 at 1245.

## 2020-06-21 NOTE — Patient Instructions (Signed)
Hi Ms. Granato, thank you for speaking with me today.  Ms. Skidmore was given information about Medicaid Managed Care team care coordination services as a part of their [facility] Medicaid benefit. Therma Berghuis verbally consented to engagement with the [facility] Managed Care team.   For questions related to your [facility] health plan, please call: [phone]  If you would like to schedule transportation through your [facility] plan, please call the following number at least 2 days in advance of your appointment: [phone]  Ms. Mullenax - following are the goals we discussed in your visit today:  Goals Addressed            This Visit's Progress   . Protect My Health       Timeframe:  Long-Range Goal Priority:  High Start Date:      05/02/20                       Expected End Date:  09/18/20                  Follow Up Date 07/19/20  - schedule recommended health tests (blood work, mammogram, colonoscopy, pap test) - schedule and keep appointment for annual check-up  Update: 06/21/20:  Patient has established with a Rushie Chestnut, NP.  Patient has also seen OB/GYN provider-has appointment scheduled with neurology, rheumatology, and appointment for pelvic ultrasound.       Patient verbalizes understanding of instructions provided today.   The Managed Medicaid care management team will reach out to the patient again over the next 30 days.  The patient has been provided with contact information for the Managed Medicaid care management team and has been advised to call with any health related questions or concerns.  Aida Raider RN, BSN [facility] Care Management Coordinator - Managed Medicaid High Risk [phone].  Following is a copy of your plan of care:         Long-Range Goal: Self-Management Plan Developed   Start Date: 05/02/2020  Expected End Date: 09/18/2020  This Visit's Progress: On track  Priority: Medium  Note:    Patient Care Plan: General Plan of Care (Adult)     Problem Identified: Health Promotion or Disease Self-Management (General Plan of Care)    Priority: High  Onset Date: 05/02/2020  Note:   Current Barriers:   Care Coordination needs related to Lupus and heavy bleeding with periods requriing pad and tampon change every 30 minutes.  Patient is fatigued and feels like she could sleep a long time.  Chronic Disease Management support and education needs related to Lupus, RA   Nurse Case Manager Clinical Goal(s):   Over the next 30 days, patient will work with Managed Medicaid team  to address needs related to Lupus and heavy bleeding, headaches. Update 06/21/20:  Patient has been evaluated by PCP and OB/GYN provider.  Has follow up appointments with pelvic ultrasound scheduled.  Over the next 30 days, patient will attend all scheduled medical appointments:    Interventions:   Inter-disciplinary care team collaboration (see longitudinal plan of care)  Collaborated with BSW regarding PCP referral.  Patient states she has recently started a new job and plans on obtaining insurance with her new place of employment when she can.  Update 06/21/20:  Patient has established with Rushie Chestnut, NP.  Discussed plans with patient for ongoing care management follow up and provided patient with direct contact information for care management team  Patient Goals/Self-Care Activities Over the next 30 days, patient will:  -Attends all scheduled provider appointments   Follow Up Plan: The Managed Medicaid care management team will reach out to the patient again over the next 30 days.  The patient has been provided with contact information for the Managed Medicaid care management team and has been advised to call with any health related questions or concerns.          Evidence-based guidance:   Review biopsychosocial determinants of health screens.   Review need for preventive screening based on  age, sex, family history and health history.   Determine level of modifiable health risk.   Discuss identified risks.   Identify areas where behavior change may lead to improved health.   Promote healthy lifestyle.   Evoke change talk using open-ended questions, pros and cons, as well as looking forward.   Identify and manage conditions or preconditions to reduce health risk.   Implement additional goals and interventions based on identified risk factors.

## 2020-06-27 ENCOUNTER — Ambulatory Visit (HOSPITAL_COMMUNITY)
Admission: RE | Admit: 2020-06-27 | Discharge: 2020-06-27 | Disposition: A | Discharge status: Home / Self Care | Payer: Medicaid Other | Source: Ambulatory Visit | Attending: Certified Nurse Midwife | Admitting: Certified Nurse Midwife

## 2020-06-27 ENCOUNTER — Ambulatory Visit (HOSPITAL_COMMUNITY): Admission: RE | Admit: 2020-06-27 | Payer: Medicaid Other | Source: Ambulatory Visit

## 2020-06-27 ENCOUNTER — Encounter (HOSPITAL_COMMUNITY): Payer: Self-pay

## 2020-06-27 ENCOUNTER — Other Ambulatory Visit: Payer: Self-pay

## 2020-06-27 DIAGNOSIS — N939 Abnormal uterine and vaginal bleeding, unspecified: Secondary | ICD-10-CM | POA: Insufficient documentation

## 2020-06-27 DIAGNOSIS — R9389 Abnormal findings on diagnostic imaging of other specified body structures: Secondary | ICD-10-CM | POA: Diagnosis not present

## 2020-06-27 NOTE — Addendum Note (Signed)
Addended by: Langston Reusing on: 06/27/2020 11:25 AM   Modules accepted: Orders

## 2020-07-03 ENCOUNTER — Ambulatory Visit: Payer: Self-pay

## 2020-07-04 ENCOUNTER — Other Ambulatory Visit: Payer: Self-pay

## 2020-07-04 NOTE — Patient Outreach (Signed)
Care Coordination  07/04/2020  Rhonda Mcgee 15-Aug-1985 [id number]   Medicaid Managed Care   Unsuccessful Outreach Note  07/04/2020 Name: Rhonda Mcgee MRN: [id number] DOB: [birth year]  Referred by: [name], NP Reason for referral : High Risk Managed Medicaid (MM F/U)   An unsuccessful telephone outreach was attempted today. The patient was referred to the case management team for assistance with care management and care coordination.   Follow Up Plan: The patient has been provided with contact information for the care management team and has been advised to call with any health related questions or concerns.   Mickel Fuchs, BSW, [place]  High Risk Managed Medicaid Team

## 2020-07-04 NOTE — Patient Instructions (Signed)
Visit Information  Ms. Mahkayla Porte  - as a part of your Medicaid benefit, you are eligible for care management and care coordination services at no cost or copay. I was unable to reach you by phone today but would be happy to help you with your health related needs. Please feel free to call me @ [phone].     Mickel Fuchs, BSW, [place]  High Risk Managed Medicaid Team

## 2020-07-09 NOTE — Progress Notes (Deleted)
Office Visit Note  Patient: Rhonda Mcgee             Date of Birth: 09/06/1985           MRN: [id number]             PCP: [name], NP Referring: [name], NP Visit Date: 07/10/2020 Occupation: @GUAROCC @  Subjective:  No chief complaint on file.   History of Present Illness: Rhonda Mcgee is a 35 y.o. female here for evaluation and management of lupus with symptoms of facial rashes, photosensitivity, arthralgias, mucosal ulcers, fatigue. 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 May or may not also have had fibromyalgia syndrome as a course of pain out of proportion to inflammatory disease seen. She took hydroxychloroquine for a short time but felt this affected her with blurry vision so off this since more than a year ago.***   Labs reviewed  10/2018 dsDNA neg C3 wnl C4 wnl  02/2018 ANA 1:160 speckled  03/2014 dsDNA neg [place] neg  Activities of Daily Living:  Patient reports morning stiffness for *** {minute/hour:19697}.   Patient {ACTIONS;DENIES/REPORTS:21021675::"Denies"} nocturnal pain.  Difficulty dressing/grooming: {ACTIONS;DENIES/REPORTS:21021675::"Denies"} Difficulty climbing stairs: {ACTIONS;DENIES/REPORTS:21021675::"Denies"} Difficulty getting out of chair: {ACTIONS;DENIES/REPORTS:21021675::"Denies"} Difficulty using hands for taps, buttons, cutlery, and/or writing: {ACTIONS;DENIES/REPORTS:21021675::"Denies"}  No Rheumatology ROS completed.   PMFS History:  Patient Active Problem List   Diagnosis Date Noted  . Abdominal pain, epigastric 12/04/2018  . GERD (gastroesophageal reflux disease) 11/10/2018  . Gastritis 11/10/2018  . NSAID induced gastritis 11/10/2018  . Intractable nausea and vomiting 11/09/2018  . Lupus ([place])   . Hypokalemia   . Vomiting     Past Medical History:  Diagnosis Date   . Chlamydia   . GERD (gastroesophageal reflux disease)   . Gonorrhea   . Herpes genitalia   . Lupus ([place])   . Rheumatoid arthritis(714.0)     Family History  Adopted: Yes  Problem Relation Age of Onset  . Diabetes Mother   . Hypertension Mother   . Diabetes Sister   . Hypertension Sister   . Lupus Maternal Grandmother   . Esophageal cancer Neg Hx   . Rectal cancer Neg Hx   . Stomach cancer Neg Hx   . Colon cancer Neg Hx    Past Surgical History:  Procedure Laterality Date  . ADENOIDECTOMY    . CHOLECYSTECTOMY    . DILATION AND CURETTAGE OF UTERUS     elective ab  . GALLBLADDER SURGERY    . TONSILLECTOMY    . TUBAL LIGATION     Social History   Social History Narrative  . Not on file    There is no immunization history on file for this patient.   Objective: Vital Signs: There were no vitals taken for this visit.   Physical Exam   Musculoskeletal Exam: ***  CDAI Exam: CDAI Score: - Patient Global: -; Provider Global: - Swollen: -; Tender: - Joint Exam 07/10/2020   No joint exam has been documented for this visit   There is currently no information documented on the homunculus. Go to the Rheumatology activity and complete the homunculus joint exam.  Investigation: No additional findings.  Imaging: US PELVIC COMPLETE WITH TRANSVAGINAL  Result Date: 06/27/2020 CLINICAL DATA:  Initial evaluation for abnormal uterine bleeding. EXAM: TRANSABDOMINAL AND TRANSVAGINAL ULTRASOUND OF PELVIS TECHNIQUE: Both transabdominal and transvaginal ultrasound examinations of the pelvis  were performed. Transabdominal technique was performed for global imaging of the pelvis including uterus, ovaries, adnexal regions, and pelvic cul-de-sac. It was necessary to proceed with endovaginal exam following the transabdominal exam to visualize the uterus, endometrium, and ovaries. COMPARISON:  Prior CT from 02/08/2020 as well as previous ultrasound from 09/29/2017. FINDINGS: Uterus  Measurements: 8.1 x 5.3 x 5.6 cm = volume: 128 mL. Uterus is retroverted. No visible fibroids on today's exam. Endometrium Thickness: 2.2 mm. No focal abnormality visualized. Somewhat ill-defined and poor interface between the endometrial and myometrial junction. No other overt imaging findings of adenomyosis. Right ovary Measurements: 3.7 x 2.2 x 3.7 cm = volume: 15.4 mL. Normal appearance/no adnexal mass. Left ovary Measurements: 2.8 x 1.7 x 3.1 cm = volume: 7.8 mL. Normal appearance/no adnexal mass. Other findings No abnormal free fluid. IMPRESSION: 1. Endometrial stripe within normal limits measuring 2.2 mm in thickness without focal abnormality. If bleeding remains unresponsive to hormonal or medical therapy, sonohysterogram should be considered for focal lesion work-up. (Ref: Radiological Reasoning: Algorithmic Workup of Abnormal Vaginal Bleeding with Endovaginal Sonography and Sonohysterography. AJR 2008; 580:D98-33). 2. Otherwise unremarkable and normal pelvic ultrasound. No visible uterine fibroids on today's exam. Electronically Signed   By: Jeannine Boga M.D.   On: 06/27/2020 19:33    Recent Labs: Lab Results  Component Value Date   WBC 5.9 06/13/2020   HGB 11.2 06/13/2020   PLT 305 06/13/2020   NA 139 05/22/2020   K 3.9 05/22/2020   CL 108 05/22/2020   CO2 20 (L) 05/22/2020   GLUCOSE 66 (L) 05/22/2020   BUN 10 05/22/2020   CREATININE 1.01 (H) 05/22/2020   BILITOT 0.6 05/22/2020   ALKPHOS 75 05/22/2020   AST 19 05/22/2020   ALT 19 05/22/2020   PROT 7.1 05/22/2020   ALBUMIN 3.5 05/22/2020   CALCIUM 9.0 05/22/2020   GFRAA >60 10/11/2019    Speciality Comments: No specialty comments available.  Procedures:  No procedures performed Allergies: Cephalexin and Keflex [cephalexin]   Assessment / Plan:     Visit Diagnoses: No diagnosis found.  Orders: No orders of the defined types were placed in this encounter.  No orders of the defined types were placed in this  encounter.   Face-to-face time spent with patient was *** minutes. Greater than 50% of time was spent in counseling and coordination of care.  Follow-Up Instructions: No follow-ups on file.   Collier Salina, MD  Note - This record has been created using [employer].  Chart creation errors have been sought, but may not always  have been located. Such creation errors do not reflect on  the standard of medical care.

## 2020-07-10 ENCOUNTER — Ambulatory Visit: Payer: BC Managed Care – PPO | Admitting: Internal Medicine

## 2020-07-19 ENCOUNTER — Other Ambulatory Visit: Payer: Self-pay | Admitting: Obstetrics and Gynecology

## 2020-07-19 ENCOUNTER — Other Ambulatory Visit: Payer: Self-pay

## 2020-07-19 NOTE — Patient Instructions (Signed)
Hi Rhonda Mcgee, thank you for speaking with me today.  Rhonda Mcgee was given information about Medicaid Managed Care team care coordination services as a part of their [facility] Medicaid benefit. Rhonda Mcgee verbally consented to engagement with the [facility] Managed Care team.   For questions related to your [facility] health plan, please call: [phone]  If you would like to schedule transportation through your [facility] plan, please call the following number at least 2 days in advance of your appointment: [phone]   Call the [facility] at [phone], at any time, 24 hours a day, 7 days a week. If you are in danger or need immediate medical attention call 911.  Ms. Rhonda Mcgee - following are the goals we discussed in your visit today:  Goals Addressed            This Visit's Progress   . Protect My Health       Timeframe:  Long-Range Goal Priority:  High Start Date:      05/02/20                       Expected End Date:  09/18/20                  Follow Up Date 08/19/20   - schedule recommended health tests (blood work, mammogram, colonoscopy, pap test) - schedule and keep appointment for annual check-up  Update: 06/21/20:  Patient has established with a Rushie Chestnut, NP.  Patient has also seen OB/GYN provider-has appointment scheduled with neurology, rheumatology, and appointment for pelvic ultrasound. Update 07/19/20:  Pelvic U/S completed.  Upcoming appointments for Neurology and Rheumatology.       Patient verbalizes understanding of instructions provided today.   The Managed Medicaid care management team will reach out to the patient again over the next 30 days.  The patient has been provided with contact information for the Managed Medicaid care management team and has been advised to call with any health related questions or concerns.   Rhonda Mcgee, Rhonda Mcgee [place] Management Coordinator  - Managed Medicaid High Risk [phone]  Following is a copy of your plan of care:    Problem Identified: Health Promotion or Disease Self-Management (General Plan of Care)     Long-Range Goal: Self-Management Plan Developed   Start Date: 05/02/2020  Expected End Date: 09/18/2020  Recent Progress: On track  Priority: Medium  Note:   Patient Care Plan: General Plan of Care (Adult)     Problem Identified: Health Promotion or Disease Self-Management (General Plan of Care)    Priority: High  Onset Date: 05/02/2020  Note:   Current Barriers:   Care Coordination needs related to Lupus and heavy bleeding with periods requriing pad and tampon change every 30 minutes.  Patient is fatigued and feels like she could sleep a long time.  Update 07/19/20:  Patient started on OCP's by PCP-some improvement.  Chronic Disease Management support and education needs related to Lupus, RA   Nurse Case Manager Clinical Goal(s):   Over the next 30 days, patient will work with Managed Medicaid team  to address needs related to Lupus and heavy bleeding, headaches. Update 06/21/20:  Patient has been evaluated by PCP and OB/GYN provider.  Has follow up appointments with pelvic ultrasound scheduled. Update 07/19/20:  Pelvic ultrasound completed.  Neurologist and Rheumatologist appointments scheduled.  Over the next 30 days, patient will attend all scheduled medical appointments:  Interventions:   Inter-disciplinary care team collaboration (see longitudinal plan of care)  Collaborated with BSW regarding PCP referral.  Patient states she has recently started a new job and plans on obtaining insurance with her new place of employment when she can.  Update 06/21/20:  Patient has established with Rushie Chestnut, NP.  Discussed plans with patient for ongoing care management follow up and provided patient with direct contact information for care management team   Patient Goals/Self-Care Activities Over the  next 30 days, patient will:  -Attends all scheduled provider appointments   Follow Up Plan: The Managed Medicaid care management team will reach out to the patient again over the next 30 days.  The patient has been provided with contact information for the Managed Medicaid care management team and has been advised to call with any health related questions or concerns.          Evidence-based guidance:   Review biopsychosocial determinants of health screens.   Review need for preventive screening based on age, sex, family history and health history.   Determine level of modifiable health risk.   Discuss identified risks.   Identify areas where behavior change may lead to improved health.   Promote healthy lifestyle.   Evoke change talk using open-ended questions, pros and cons, as well as looking forward.   Identify and manage conditions or preconditions to reduce health risk.   Implement additional goals and interventions based on identified risk factors.

## 2020-07-19 NOTE — Patient Outreach (Signed)
Medicaid Managed Care   Nurse Care Manager Note  07/19/2020 Name:  Rhonda Mcgee MRN:  [id number] DOB:  02/24/1986  Rhonda Mcgee is an 35 y.o. year old female who is a primary patient of Rhonda Francois, NP.  The [facility] Managed Care Coordination team was consulted for assistance with:    chronic healthcare management needs.  Rhonda Mcgee was given information about Medicaid Managed Care Coordination team services today. CHYIFOYD [name] agreed to services and verbal consent obtained.  Engaged with patient by telephone for follow up visit in response to provider referral for case management and/or care coordination services.   Assessments/Interventions:  Review of past medical history, allergies, medications, health status, including review of consultants reports, laboratory and other test data, was performed as part of comprehensive evaluation and provision of chronic care management services.  SDOH (Social Determinants of Health) assessments and interventions performed:   Care Plan  Allergies  Allergen Reactions  . Cephalexin Hives  . Keflex [Cephalexin] Hives    Medications Reviewed Today    Reviewed by Rhonda Medicus, RN (Registered Nurse) on 07/19/20 at 40  Med List Status: <None>  Medication Order Taking? Sig Documenting Provider Last Dose Status Informant  azithromycin (AZASITE) 1 % ophthalmic solution [id number] Yes Place 1 drop into both eyes 2 (two) times daily. Rhonda Francois, NP Taking Active   carboxymethylcellul-glycerin (REFRESH OPTIVE) 0.5-0.9 % ophthalmic solution [id number]  Place 1 drop into both eyes 4 (four) times daily as needed for dry eyes.  Patient not taking: Reported on 06/21/2020   Rhonda Francois, NP  Active   clindamycin (CLEOCIN) 300 MG capsule [id number] No Take 1 capsule (300 mg total) by mouth 2 (two) times daily. For seven days  Patient not taking: Reported on 07/19/2020   Rhonda Mcgee, CNM Not Taking Active   LO LOESTRIN FE 1  MG-10 MCG / 10 MCG tablet [id number] Yes Take 1 tablet by mouth daily. Rhonda Mcgee, CNM Taking Active   pantoprazole (PROTONIX) 40 MG tablet [id number] No Take by mouth.  Patient not taking: Reported on 07/19/2020   [provider] Not Taking Active   SUMAtriptan (IMITREX) 25 MG tablet [id number] Yes Take 1 tablet (25 mg total) by mouth every 2 (two) hours as needed for migraine. May repeat in 2 hours if headache persists or recurs. Rhonda Francois, NP Taking Active           Patient Active Problem List   Diagnosis Date Noted  . Abdominal pain, epigastric 12/04/2018  . GERD (gastroesophageal reflux disease) 11/10/2018  . Gastritis 11/10/2018  . NSAID induced gastritis 11/10/2018  . Intractable nausea and vomiting 11/09/2018  . Lupus ([place])   . Hypokalemia   . Vomiting     Conditions to be addressed/monitored per PCP order:  chronic healthcare management needs, Lupus, RA , GERD  Care Plan : General Plan of Care (Adult)  Updates made by Rhonda Medicus, RN since 07/19/2020 12:00 AM    Problem: Health Promotion or Disease Self-Management (General Plan of Care)     Long-Range Goal: Self-Management Plan Developed   Start Date: 05/02/2020  Expected End Date: 09/18/2020  Recent Progress: On track  Priority: Medium  Note:   Patient Care Plan: General Plan of Care (Adult)     Problem Identified: Health Promotion or Disease Self-Management (General Plan of Care)    Priority: High  Onset Date: 05/02/2020  Note:   Current Barriers:   Care Coordination needs related to Lupus  and heavy bleeding with periods requriing pad and tampon change every 30 minutes.  Patient is fatigued and feels like she could sleep a long time.  Update 07/19/20:  Patient started on OCP's by PCP-some improvement.  Chronic Disease Management support and education needs related to Lupus, RA   Nurse Case Manager Clinical Goal(s):   Over the next 30 days, patient will work with Managed Medicaid team  to  address needs related to Lupus and heavy bleeding, headaches. Update 06/21/20:  Patient has been evaluated by PCP and OB/GYN provider.  Has follow up appointments with pelvic ultrasound scheduled. Update 07/19/20:  Pelvic ultrasound completed.  Neurologist and Rheumatologist appointments scheduled.  Over the next 30 days, patient will attend all scheduled medical appointments:    Interventions:   Inter-disciplinary care team collaboration (see longitudinal plan of care)  Collaborated with BSW regarding PCP referral.  Patient states she has recently started a new job and plans on obtaining insurance with her new place of employment when she can.  Update 06/21/20:  Patient has established with Rhonda Chestnut, NP.  Discussed plans with patient for ongoing care management follow up and provided patient with direct contact information for care management team   Patient Goals/Self-Care Activities Over the next 30 days, patient will:  -Attends all scheduled provider appointments   Follow Up Plan: The Managed Medicaid care management team will reach out to the patient again over the next 30 days.  The patient has been provided with contact information for the Managed Medicaid care management team and has been advised to call with any health related questions or concerns.          Evidence-based guidance:   Review biopsychosocial determinants of health screens.   Review need for preventive screening based on age, sex, family history and health history.   Determine level of modifiable health risk.   Discuss identified risks.   Identify areas where behavior change may lead to improved health.   Promote healthy lifestyle.   Evoke change talk using open-ended questions, pros and cons, as well as looking forward.   Identify and manage conditions or preconditions to reduce health risk.   Implement additional goals and interventions based on identified risk factors.     Follow Up:   Patient agrees to Care Plan and Follow-up.  Plan: The Managed Medicaid care management team will reach out to the patient again over the next 30 days. and The patient has been provided with contact information for the Managed Medicaid care management team and has been advised to call with any health related questions or concerns.  Date/time of next scheduled RN care management/care coordination outreach:  08/16/20 at 0900.

## 2020-08-03 ENCOUNTER — Emergency Department (HOSPITAL_COMMUNITY)
Admission: EM | Admit: 2020-08-03 | Discharge: 2020-08-04 | Disposition: A | Discharge status: Home / Self Care | Payer: Medicaid Other | Attending: Emergency Medicine | Admitting: Emergency Medicine

## 2020-08-03 ENCOUNTER — Encounter (HOSPITAL_COMMUNITY): Payer: Self-pay

## 2020-08-03 ENCOUNTER — Other Ambulatory Visit: Payer: Self-pay

## 2020-08-03 DIAGNOSIS — F17210 Nicotine dependence, cigarettes, uncomplicated: Secondary | ICD-10-CM | POA: Insufficient documentation

## 2020-08-03 DIAGNOSIS — N760 Acute vaginitis: Secondary | ICD-10-CM

## 2020-08-03 DIAGNOSIS — B9689 Other specified bacterial agents as the cause of diseases classified elsewhere: Secondary | ICD-10-CM

## 2020-08-03 DIAGNOSIS — L739 Follicular disorder, unspecified: Secondary | ICD-10-CM | POA: Insufficient documentation

## 2020-08-03 DIAGNOSIS — R1909 Other intra-abdominal and pelvic swelling, mass and lump: Secondary | ICD-10-CM | POA: Diagnosis not present

## 2020-08-03 NOTE — ED Triage Notes (Signed)
Pt reports vaginal swollen and states it hurts when using the bathroom, Pt states it has been like this for  2 days and progressively getting worse. Pt also reports an cough as well.  Pt states hx of hemorrhoids

## 2020-08-04 DIAGNOSIS — L739 Follicular disorder, unspecified: Secondary | ICD-10-CM | POA: Diagnosis not present

## 2020-08-04 DIAGNOSIS — B9689 Other specified bacterial agents as the cause of diseases classified elsewhere: Secondary | ICD-10-CM | POA: Diagnosis not present

## 2020-08-04 DIAGNOSIS — N760 Acute vaginitis: Secondary | ICD-10-CM | POA: Diagnosis not present

## 2020-08-04 DIAGNOSIS — F17210 Nicotine dependence, cigarettes, uncomplicated: Secondary | ICD-10-CM | POA: Diagnosis not present

## 2020-08-04 LAB — URINALYSIS, ROUTINE W REFLEX MICROSCOPIC
Bilirubin Urine: NEGATIVE
Glucose, UA: NEGATIVE mg/dL
Hgb urine dipstick: NEGATIVE
Ketones, ur: NEGATIVE mg/dL
Leukocytes,Ua: NEGATIVE
Nitrite: NEGATIVE
Protein, ur: NEGATIVE mg/dL
Specific Gravity, Urine: 1.02 (ref 1.005–1.030)
pH: 5 (ref 5.0–8.0)

## 2020-08-04 LAB — WET PREP, GENITAL
Sperm: NONE SEEN
Trich, Wet Prep: NONE SEEN
WBC, Wet Prep HPF POC: NONE SEEN
Yeast Wet Prep HPF POC: NONE SEEN

## 2020-08-04 MED ORDER — CLINDAMYCIN HCL 300 MG PO CAPS: 300.0000 mg | ORAL_CAPSULE | Freq: Two times a day (BID) | ORAL | 0 refills | Status: DC

## 2020-08-04 MED ORDER — CLINDAMYCIN HCL 150 MG PO CAPS
300.0000 mg | ORAL_CAPSULE | Freq: Once | ORAL | Status: AC
  Administered 2020-08-04: 300 mg via ORAL
  Filled 2020-08-04: qty 2

## 2020-08-04 NOTE — ED Provider Notes (Signed)
EMERGENCY DEPARTMENT Provider Note  CSN: [id number] Arrival date & time: 08/03/20 2040    History Chief Complaint  Patient presents with  . Groin Swelling    HPI  Rhonda Mcgee is a 35 y.o. female reports 2 days of swelling in her vaginal area, associated with sharp pains with urination and some increased discomfort with bowel movements. She reports she sometimes gets 'boils' when she is on her menses which she finished about a week ago. She was recently evaluated by Ob/Gyn for AUB and had labs done showing BV but did not get that message or Rx for Abx filled. Pelvic US done then was also unremarkable.    Past Medical History:  Diagnosis Date  . Chlamydia   . GERD (gastroesophageal reflux disease)   . Gonorrhea   . Herpes genitalia   . Lupus ([place])   . Rheumatoid arthritis(714.0)     Past Surgical History:  Procedure Laterality Date  . ADENOIDECTOMY    . CHOLECYSTECTOMY    . DILATION AND CURETTAGE OF UTERUS     elective ab  . GALLBLADDER SURGERY    . TONSILLECTOMY    . TUBAL LIGATION      Family History  Adopted: Yes  Problem Relation Age of Onset  . Diabetes Mother   . Hypertension Mother   . Diabetes Sister   . Hypertension Sister   . Lupus Maternal Grandmother   . Esophageal cancer Neg Hx   . Rectal cancer Neg Hx   . Stomach cancer Neg Hx   . Colon cancer Neg Hx     Social History   Tobacco Use  . Smoking status: Current Some Day Smoker    Types: Cigarettes  . Smokeless tobacco: Never Used  . Tobacco comment: trying to quit, no smoking for last 2 weeks  Vaping Use  . Vaping Use: Never used  Substance Use Topics  . Alcohol use: Yes    Comment: occasional  . Drug use: No     Home Medications Prior to Admission medications   Medication Sig Start Date End Date Taking? Authorizing Provider  carboxymethylcellul-glycerin (REFRESH OPTIVE) 0.5-0.9 % ophthalmic solution Place 1 drop into both eyes 4 (four) times daily as needed for dry  eyes. Patient not taking: Reported on 06/21/2020 05/24/20 08/22/20  [name], NP  clindamycin (CLEOCIN) 300 MG capsule Take 1 capsule (300 mg total) by mouth 2 (two) times daily. For seven days 08/04/20   Truddie Hidden, MD  LO LOESTRIN FE 1 MG-10 MCG / 10 MCG tablet Take 1 tablet by mouth daily. 06/13/20   Lajean Manes, CNM  pantoprazole (PROTONIX) 40 MG tablet Take by mouth. Patient not taking: Reported on 07/19/2020 03/04/20   [provider]  SUMAtriptan (IMITREX) 25 MG tablet Take 1 tablet (25 mg total) by mouth every 2 (two) hours as needed for migraine. May repeat in 2 hours if headache persists or recurs. 05/24/20   [name], NP     Allergies    Cephalexin and Keflex [cephalexin]   Review of Systems   Review of Systems A comprehensive review of systems was completed and negative except as noted in HPI.    Physical Exam BP 111/75   Pulse 77   Temp 98 F (36.7 C)   Resp 17   Ht 5\' 5"  (1.651 m)   Wt 90.7 kg   SpO2 100%   BMI 33.28 kg/m   Physical Exam Vitals and nursing note reviewed.  Constitutional:      Appearance: Normal appearance.  HENT:     Head: Normocephalic and atraumatic.     Nose: Nose normal.     Mouth/Throat:     Mouth: Mucous membranes are moist.  Eyes:     Extraocular Movements: Extraocular movements intact.     Conjunctiva/sclera: Conjunctivae normal.  Cardiovascular:     Rate and Rhythm: Normal rate.  Pulmonary:     Effort: Pulmonary effort is normal.     Breath sounds: Normal breath sounds.  Abdominal:     General: Abdomen is flat.     Palpations: Abdomen is soft.     Tenderness: There is no abdominal tenderness.  Musculoskeletal:        General: No swelling. Normal range of motion.     Cervical back: Neck supple.  Skin:    General: Skin is warm and dry.  Neurological:     General: No focal deficit present.     Mental Status: She is alert.  Psychiatric:        Mood and Affect: Mood normal.      ED  Results / Procedures / Treatments   Labs (all labs ordered are listed, but only abnormal results are displayed) Labs Reviewed  WET PREP, GENITAL - Abnormal; Notable for the following components:      Result Value   Clue Cells Wet Prep HPF POC PRESENT (*)    All other components within normal limits  URINALYSIS, ROUTINE W REFLEX MICROSCOPIC  PREGNANCY, URINE    EKG None  Radiology No results found.  Procedures Procedures  Medications Ordered in the ED Medications  clindamycin (CLEOCIN) capsule 300 mg (300 mg Oral Given 08/04/20 0209)     MDM Rules/Calculators/A&P MDM UA, Hcg sent. Will set up for pelvic exam.  ED Course  I have reviewed the triage vital signs and the nursing notes.  Pertinent labs & imaging results that were available during my care of the patient were reviewed by me and considered in my medical decision making (see chart for details).  Clinical Course as of 08/04/20 0450  Fri Aug 04, 2020  0100 Pelvic: chaperone present, external genitalia with small area of induration/erythema and tenderness to the L labial majora, no focal fluctuance, no peri-rectal mass/tenderness.  [CS]  0101 Wet prep still with signs of BV, did not get treated after  recent Ob/Gyn visit but their notes mention treating with Clinda for recurrent infection. This should also cover her folliculitis as well. Awaiting UA/Preg.  [CS]    Clinical Course User Index [CS] Truddie Hidden, MD    Final Clinical Impression(s) / ED Diagnoses Final diagnoses:  BV (bacterial vaginosis)  Folliculitis    Rx / DC Orders ED Discharge Orders         Ordered    clindamycin (CLEOCIN) 300 MG capsule  2 times daily        08/04/20 0155           Truddie Hidden, MD 08/04/20 [phone]

## 2020-08-04 NOTE — ED Notes (Signed)
Patient verbalizes understanding of discharge instructions. Opportunity for questioning and answers were provided. Armband removed by staff, pt discharged from ED ambulatory.   

## 2020-08-06 ENCOUNTER — Encounter (HOSPITAL_COMMUNITY): Payer: Self-pay

## 2020-08-06 ENCOUNTER — Other Ambulatory Visit: Payer: Self-pay

## 2020-08-06 ENCOUNTER — Emergency Department (HOSPITAL_COMMUNITY)
Admission: EM | Admit: 2020-08-06 | Discharge: 2020-08-06 | Disposition: A | Discharge status: Home / Self Care | Payer: Medicaid Other | Attending: Emergency Medicine | Admitting: Emergency Medicine

## 2020-08-06 DIAGNOSIS — N760 Acute vaginitis: Secondary | ICD-10-CM | POA: Diagnosis not present

## 2020-08-06 DIAGNOSIS — B9689 Other specified bacterial agents as the cause of diseases classified elsewhere: Secondary | ICD-10-CM | POA: Diagnosis not present

## 2020-08-06 DIAGNOSIS — F17210 Nicotine dependence, cigarettes, uncomplicated: Secondary | ICD-10-CM | POA: Insufficient documentation

## 2020-08-06 DIAGNOSIS — L739 Follicular disorder, unspecified: Secondary | ICD-10-CM | POA: Diagnosis not present

## 2020-08-06 DIAGNOSIS — L292 Pruritus vulvae: Secondary | ICD-10-CM | POA: Diagnosis not present

## 2020-08-06 LAB — URINALYSIS, ROUTINE W REFLEX MICROSCOPIC
Bilirubin Urine: NEGATIVE
Glucose, UA: NEGATIVE mg/dL
Hgb urine dipstick: NEGATIVE
Ketones, ur: NEGATIVE mg/dL
Leukocytes,Ua: NEGATIVE
Nitrite: NEGATIVE
Protein, ur: NEGATIVE mg/dL
Specific Gravity, Urine: 1.021 (ref 1.005–1.030)
pH: 5 (ref 5.0–8.0)

## 2020-08-06 LAB — I-STAT BETA HCG BLOOD, ED (MC, WL, AP ONLY): I-stat hCG, quantitative: <5 m[IU]/mL (ref <5)

## 2020-08-06 MED ORDER — ONDANSETRON 4 MG PO TBDP
4.0000 mg | ORAL_TABLET | Freq: Once | ORAL | Status: AC
  Administered 2020-08-06: 4 mg via ORAL
  Filled 2020-08-06: qty 1

## 2020-08-06 MED ORDER — OXYCODONE-ACETAMINOPHEN 5-325 MG PO TABS
1.0000 | ORAL_TABLET | Freq: Once | ORAL | Status: AC
Start: 2020-08-06 — End: 2020-08-06
  Administered 2020-08-06: 1 via ORAL
  Filled 2020-08-06: qty 1

## 2020-08-06 MED ORDER — METRONIDAZOLE 500 MG PO TABS: 500.0000 mg | ORAL_TABLET | Freq: Two times a day (BID) | ORAL | 0 refills | Status: AC

## 2020-08-06 MED ORDER — LIDOCAINE 5 % EX OINT: 1.0000 "application " | TOPICAL_OINTMENT | Freq: Three times a day (TID) | CUTANEOUS | 0 refills | Status: DC | PRN

## 2020-08-06 MED ORDER — LIDOCAINE 4 % EX CREA
TOPICAL_CREAM | Freq: Once | CUTANEOUS | Status: AC
  Filled 2020-08-06: qty 5

## 2020-08-06 NOTE — ED Triage Notes (Signed)
Patient seen on Thursday for the same, dx with folliculitis and BV, reports she is taking clinda without relief.

## 2020-08-06 NOTE — ED Provider Notes (Signed)
MSE was initiated and I personally evaluated the patient and placed orders (if any) at  12:27 AM on August 06, 2020.  The patient appears stable so that the remainder of the MSE may be completed by another provider.  Patient placed in Quick Look pathway, seen and evaluated   Chief Complaint: pain, irritation noted to labia  HPI:   35 year old female patient pain, vaginal area.  She was seen here on 08/03/2020 and was evaluated and diagnosed with folliculitis.  She was discharged home on clindamycin which she states she has been taking.  She reports that since then, she has had worsening pain, swelling.  She decided to present.  8 kids were completed she has also had nausea with a headache clindamycin.  No fevers.  ROS: vaginal pain   Physical Exam:   Gen: No distress  Neuro: Awake and Alert  Skin: Warm    Focused Exam: The exam was performed with a chaperone present Rhonda Mcgee, [place]).  Small dime sized area noted to right labia/groin area that appears to be a possible small abscess.  This area is tender, no active drainage.   Initiation of care has begun. The patient has been counseled on the process, plan, and necessity for staying for the completion/evaluation, and the remainder of the medical screening examination    Rhonda Mcgee 16/10/96 0454    Delora Fuel, MD 09/81/19 0200

## 2020-08-06 NOTE — ED Provider Notes (Signed)
[facility] Provider Note  CSN: [id number] Arrival date & time: 08/06/20 0001  Chief Complaint(s) Groin Swelling  HPI Rhonda Mcgee is a 35 y.o. female recently diagnosed with bacterial vaginosis and vulvar folliculitis placed on clindamycin 3 days ago returns for persistent vulvar itching and pain to her right labia.  She believes the clindamycin is not helping.  Labia pain is worse with palpation and ambulation as well as sitting.  No alleviating factors.  Denies any other physical complaints  HPI  Past Medical History Past Medical History:  Diagnosis Date  . Chlamydia   . GERD (gastroesophageal reflux disease)   . Gonorrhea   . Herpes genitalia   . Lupus ([place])   . Rheumatoid arthritis(714.0)    Patient Active Problem List   Diagnosis Date Noted  . Abdominal pain, epigastric 12/04/2018  . GERD (gastroesophageal reflux disease) 11/10/2018  . Gastritis 11/10/2018  . NSAID induced gastritis 11/10/2018  . Intractable nausea and vomiting 11/09/2018  . Lupus ([place])   . Hypokalemia   . Vomiting    Home Medication(s) Prior to Admission medications   Medication Sig Start Date End Date Taking? Authorizing Provider  lidocaine (XYLOCAINE) 5 % ointment Apply 1 application topically 3 (three) times daily as needed. 08/06/20  Yes Braylen Staller, Grayce Sessions, MD  metroNIDAZOLE (FLAGYL) 500 MG tablet Take 1 tablet (500 mg total) by mouth 2 (two) times daily for 7 days. 08/06/20 08/13/20 Yes Noal Abshier, Grayce Sessions, MD  carboxymethylcellul-glycerin (REFRESH OPTIVE) 0.5-0.9 % ophthalmic solution Place 1 drop into both eyes 4 (four) times daily as needed for dry eyes. Patient not taking: Reported on 06/21/2020 05/24/20 08/22/20  [name], NP  clindamycin (CLEOCIN) 300 MG capsule Take 1 capsule (300 mg total) by mouth 2 (two) times daily. For seven days 08/04/20   Truddie Hidden, MD  LO LOESTRIN FE 1 MG-10 MCG / 10 MCG tablet Take 1 tablet by mouth daily.  06/13/20   Lajean Manes, CNM  pantoprazole (PROTONIX) 40 MG tablet Take by mouth. Patient not taking: Reported on 07/19/2020 03/04/20   [provider]  SUMAtriptan (IMITREX) 25 MG tablet Take 1 tablet (25 mg total) by mouth every 2 (two) hours as needed for migraine. May repeat in 2 hours if headache persists or recurs. 05/24/20   [name], NP                                                                                                                                    Past Surgical History Past Surgical History:  Procedure Laterality Date  . ADENOIDECTOMY    . CHOLECYSTECTOMY    . DILATION AND CURETTAGE OF UTERUS     elective ab  . GALLBLADDER SURGERY    . TONSILLECTOMY    . TUBAL LIGATION     Family History Family History  Adopted: Yes  Problem Relation Age of Onset  .  Diabetes Mother   . Hypertension Mother   . Diabetes Sister   . Hypertension Sister   . Lupus Maternal Grandmother   . Esophageal cancer Neg Hx   . Rectal cancer Neg Hx   . Stomach cancer Neg Hx   . Colon cancer Neg Hx     Social History Social History   Tobacco Use  . Smoking status: Current Some Day Smoker    Types: Cigarettes  . Smokeless tobacco: Never Used  . Tobacco comment: trying to quit, no smoking for last 2 weeks  Vaping Use  . Vaping Use: Never used  Substance Use Topics  . Alcohol use: Yes    Comment: occasional  . Drug use: No   Allergies Cephalexin and Keflex [cephalexin]  Review of Systems Review of Systems All other systems are reviewed and are negative for acute change except as noted in the HPI  Physical Exam Vital Signs  I have reviewed the triage vital signs BP (!) 105/58   Pulse 71   Temp 98.1 F (36.7 C) (Oral)   Resp 16   SpO2 100%    Physical Exam Vitals reviewed. Exam conducted with a chaperone present.  Constitutional:      General: She is not in acute distress.    Appearance: She is well-developed. She is not diaphoretic.  HENT:      Head: Normocephalic and atraumatic.     Right Ear: External ear normal.     Left Ear: External ear normal.     Nose: Nose normal.  Eyes:     General: No scleral icterus.    Conjunctiva/sclera: Conjunctivae normal.  Neck:     Trachea: Phonation normal.  Cardiovascular:     Rate and Rhythm: Normal rate and regular rhythm.  Pulmonary:     Effort: Pulmonary effort is normal. No respiratory distress.     Breath sounds: No stridor.  Abdominal:     General: There is no distension.  Genitourinary:    Pubic Area: No rash.      Labia:        Right: No rash.        Left: No rash.     Musculoskeletal:        General: Normal range of motion.     Cervical back: Normal range of motion.  Neurological:     Mental Status: She is alert and oriented to person, place, and time.  Psychiatric:        Behavior: Behavior normal.     ED Results and Treatments Labs (all labs ordered are listed, but only abnormal results are displayed) Labs Reviewed  URINALYSIS, ROUTINE W REFLEX MICROSCOPIC - Abnormal; Notable for the following components:      Result Value   APPearance HAZY (*)    All other components within normal limits  I-STAT BETA HCG BLOOD, ED ([facility], [facility], [facility] ONLY)  EKG  EKG Interpretation  Date/Time:    Ventricular Rate:    PR Interval:    QRS Duration:   QT Interval:    QTC Calculation:   R Axis:     Text Interpretation:        Radiology No results found.  Pertinent labs & imaging results that were available during my care of the patient were reviewed by me and considered in my medical decision making (see chart for details).  Medications Ordered in ED Medications  ondansetron (ZOFRAN-ODT) disintegrating tablet 4 mg (4 mg Oral Given 08/06/20 0039)  oxyCODONE-acetaminophen (PERCOCET/ROXICET) 5-325 MG per tablet 1 tablet (1 tablet Oral Given 08/06/20 0039)   lidocaine (LMX) 4 % cream ( Topical Given 08/06/20 0523)                                                                                                                                    Procedures Procedures  (including critical care time)  Medical Decision Making / ED Course I have reviewed the nursing notes for this encounter and the patient's prior records (if available in EHR or on provided paperwork).   [name] Below was evaluated in Emergency Department on 08/06/2020 for the symptoms described in the history of present illness. She was evaluated in the context of the global COVID-19 pandemic, which necessitated consideration that the patient might be at risk for infection with the SARS-CoV-2 virus that causes COVID-19. Institutional protocols and algorithms that pertain to the evaluation of patients at risk for COVID-19 are in a state of rapid change based on information released by regulatory bodies including the CDC and federal and state organizations. These policies and algorithms were followed during the patient's care in the ED.  Known BV. Labia folliculitis appears to be indurated without underlying fluctuance.  No erythema which is a change from previous exam, suggesting improvement. Patient requesting Flagyl for the BV and something to assist with the pain of the folliculitis. Prescription for Flagyl provided. Given topical lidocaine.    Final Clinical Impression(s) / ED Diagnoses Final diagnoses:  BV (bacterial vaginosis)  Folliculitis    The patient appears reasonably screened and/or stabilized for discharge and I doubt any other medical condition or other [facility] requiring further screening, evaluation, or treatment in the ED at this time prior to discharge. Safe for discharge with strict return precautions.  Disposition: Discharge  Condition: Good  I have discussed the results, Dx and Tx plan with the patient/family who expressed understanding and agree(s) with  the plan. Discharge instructions discussed at length. The patient/family was given strict return precautions who verbalized understanding of the instructions. No further questions at time of discharge.    ED Discharge Orders         Ordered    metroNIDAZOLE (FLAGYL) 500 MG tablet  2 times daily        08/06/20 0424    lidocaine (XYLOCAINE) 5 % ointment  3 times daily PRN        08/06/20 0425          Follow Up: Ob/Gyn        This chart was dictated using voice recognition software.  Despite best efforts to proofread,  errors can occur which can change the documentation meaning.   Fatima Blank, MD 08/06/20 [phone]

## 2020-08-07 ENCOUNTER — Telehealth: Payer: Self-pay | Admitting: *Deleted

## 2020-08-07 NOTE — Telephone Encounter (Signed)
Transition Care Management Follow-up Telephone Call  Date of discharge and from where: 08/06/2020 - [facility]  How have you been since you were released from the hospital? "Fine"  Any questions or concerns? No  Items Reviewed:  Did the pt receive and understand the discharge instructions provided? Yes   Medications obtained and verified? Yes   Other? No   Any new allergies since your discharge? No   Dietary orders reviewed? No  Do you have support at home? Yes     Functional Questionnaire: (I = Independent and D = Dependent) ADLs: I  Bathing/Dressing- I  Meal Prep- I  Eating- I  Maintaining continence- I  Transferring/Ambulation- I  Managing Meds- I  Follow up appointments reviewed:   PCP Hospital f/u appt confirmed? Yes  Scheduled to see Rhonda David, NP on 08/23/2020 @ 1120.  [facility] f/u appt confirmed? No    Are transportation arrangements needed? No   If their condition worsens, is the pt aware to call PCP or go to the Emergency Dept.? Yes  Was the patient provided with contact information for the PCP's office or ED? Yes  Was to pt encouraged to call back with questions or concerns? Yes

## 2020-08-14 ENCOUNTER — Ambulatory Visit (INDEPENDENT_AMBULATORY_CARE_PROVIDER_SITE_OTHER): Payer: Medicaid Other | Admitting: Neurology

## 2020-08-14 ENCOUNTER — Encounter: Payer: Self-pay | Admitting: Neurology

## 2020-08-14 VITALS — BP 112/69 | HR 95 | Ht 65.0 in | Wt 210.0 lb

## 2020-08-14 DIAGNOSIS — G43711 Chronic migraine without aura, intractable, with status migrainosus: Secondary | ICD-10-CM

## 2020-08-14 DIAGNOSIS — M0579 Rheumatoid arthritis with rheumatoid factor of multiple sites without organ or systems involvement: Secondary | ICD-10-CM | POA: Diagnosis not present

## 2020-08-14 DIAGNOSIS — M329 Systemic lupus erythematosus, unspecified: Secondary | ICD-10-CM | POA: Diagnosis not present

## 2020-08-14 DIAGNOSIS — R2990 Unspecified symptoms and signs involving the nervous system: Secondary | ICD-10-CM

## 2020-08-14 DIAGNOSIS — H539 Unspecified visual disturbance: Secondary | ICD-10-CM

## 2020-08-14 DIAGNOSIS — G441 Vascular headache, not elsewhere classified: Secondary | ICD-10-CM

## 2020-08-14 DIAGNOSIS — R519 Headache, unspecified: Secondary | ICD-10-CM

## 2020-08-14 DIAGNOSIS — R29898 Other symptoms and signs involving the musculoskeletal system: Secondary | ICD-10-CM

## 2020-08-14 DIAGNOSIS — R51 Headache with orthostatic component, not elsewhere classified: Secondary | ICD-10-CM | POA: Diagnosis not present

## 2020-08-14 MED ORDER — NORTRIPTYLINE HCL 25 MG PO CAPS: ORAL_CAPSULE | ORAL | 3 refills | Status: DC

## 2020-08-14 MED ORDER — ONDANSETRON 4 MG PO TBDP: 4.0000 mg | ORAL_TABLET | Freq: Three times a day (TID) | ORAL | 3 refills | Status: DC | PRN

## 2020-08-14 MED ORDER — RIZATRIPTAN BENZOATE 10 MG PO TBDP: 10.0000 mg | ORAL_TABLET | ORAL | 11 refills | Status: DC | PRN

## 2020-08-14 NOTE — Patient Instructions (Addendum)
MRI of the brain Emergency/Acute: Rizatriptan: Please take one tablet at the onset of your headache. If it does not improve the symptoms please take one additional tablet. Do not take more then 2 tablets in 24hrs. Do not take use more then 2 to 3 times in a week. Take with ondansetron together.  Prevention: Try Nortriptyline. May help with mood as well. Effexor is another option but if this does not help I would try Topamax next and if still suffering would go to [facility] (I would check with Rheumatologist)  Ondansetron oral dissolving tablet What is this medicine? ONDANSETRON (on DAN se tron) is used to treat nausea and vomiting caused by chemotherapy. It is also used to prevent or treat nausea and vomiting after surgery. This medicine may be used for other purposes; ask your health care provider or pharmacist if you have questions. COMMON BRAND NAME(S): Zofran ODT What should I tell my health care provider before I take this medicine? They need to know if you have any of these conditions:  heart disease  history of irregular heartbeat  liver disease  low levels of magnesium or potassium in the blood  an unusual or allergic reaction to ondansetron, granisetron, other medicines, foods, dyes, or preservatives  pregnant or trying to get pregnant  breast-feeding How should I use this medicine? These tablets are made to dissolve in the mouth. Do not try to push the tablet through the foil backing. With dry hands, peel away the foil backing and gently remove the tablet. Place the tablet in the mouth and allow it to dissolve, then swallow. While you may take these tablets with water, it is not necessary to do so. Talk to your pediatrician regarding the use of this medicine in children. Special care may be needed. Overdosage: If you think you have taken too much of this medicine contact a poison control center or emergency room at once. NOTE: This medicine is only for you. Do not share this  medicine with others. What if I miss a dose? If you miss a dose, take it as soon as you can. If it is almost time for your next dose, take only that dose. Do not take double or extra doses. What may interact with this medicine? Do not take this medicine with any of the following medications:  apomorphine  certain medicines for fungal infections like fluconazole, itraconazole, ketoconazole, posaconazole, voriconazole  cisapride  dronedarone  pimozide  thioridazine This medicine may also interact with the following medications:  carbamazepine  certain medicines for depression, anxiety, or psychotic disturbances  fentanyl  linezolid  MAOIs like Carbex, Eldepryl, Marplan, Nardil, and Parnate  methylene blue (injected into a vein)  other medicines that prolong the QT interval (cause an abnormal heart rhythm) like dofetilide, ziprasidone  phenytoin  rifampicin  tramadol This list may not describe all possible interactions. Give your health care provider a list of all the medicines, herbs, non-prescription drugs, or dietary supplements you use. Also tell them if you smoke, drink alcohol, or use illegal drugs. Some items may interact with your medicine. What should I watch for while using this medicine? Check with your doctor or health care professional as soon as you can if you have any sign of an allergic reaction. What side effects may I notice from receiving this medicine? Side effects that you should report to your doctor or health care professional as soon as possible:  allergic reactions like skin rash, itching or hives, swelling of the face, lips,  or tongue  breathing problems  confusion  dizziness  fast or irregular heartbeat  feeling faint or lightheaded, falls  fever and chills  loss of balance or coordination  seizures  sweating  swelling of the hands and feet  tightness in the chest  tremors  unusually weak or tired Side effects that usually  do not require medical attention (report to your doctor or health care professional if they continue or are bothersome):  constipation or diarrhea  headache This list may not describe all possible side effects. Call your doctor for medical advice about side effects. You may report side effects to FDA at 1-800-FDA-1088. Where should I keep my medicine? Keep out of the reach of children. Store between 2 and 30 degrees C (36 and 86 degrees F). Throw away any unused medicine after the expiration date. NOTE: This sheet is a summary. It may not cover all possible information. If you have questions about this medicine, talk to your doctor, pharmacist, or health care provider.  2021 Elsevier/Gold Standard (2018-04-07 07:14:10) Rizatriptan disintegrating tablets What is this medicine? RIZATRIPTAN (rye za TRIP tan) is used to treat migraines with or without aura. An aura is a strange feeling or visual disturbance that warns you of an attack. It is not used to prevent migraines. This medicine may be used for other purposes; ask your health care provider or pharmacist if you have questions. COMMON BRAND NAME(S): Maxalt-MLT What should I tell my health care provider before I take this medicine? They need to know if you have any of these conditions:  cigarette smoker  circulation problems in fingers and toes  diabetes  heart disease  high blood pressure  high cholesterol  history of irregular heartbeat  history of stroke  kidney disease  liver disease  stomach or intestine problems  an unusual or allergic reaction to rizatriptan, other medicines, foods, dyes, or preservatives  pregnant or trying to get pregnant  breast-feeding How should I use this medicine? Take this medicine by mouth. Follow the directions on the prescription label. Leave the tablet in the sealed blister pack until you are ready to take it. With dry hands, open the blister and gently remove the tablet. If the  tablet breaks or crumbles, throw it away and take a new tablet out of the blister pack. Place the tablet in the mouth and allow it to dissolve, and then swallow. Do not cut, crush, or chew this medicine. You do not need water to take this medicine. Do not take it more often than directed. Talk to your pediatrician regarding the use of this medicine in children. While this drug may be prescribed for children as young as 6 years for selected conditions, precautions do apply. Overdosage: If you think you have taken too much of this medicine contact a poison control center or emergency room at once. NOTE: This medicine is only for you. Do not share this medicine with others. What if I miss a dose? This does not apply. This medicine is not for regular use. What may interact with this medicine? Do not take this medicine with any of the following medicines:  certain medicines for migraine headache like almotriptan, eletriptan, frovatriptan, naratriptan, rizatriptan, sumatriptan, zolmitriptan  ergot alkaloids like dihydroergotamine, ergonovine, ergotamine, methylergonovine  MAOIs like Carbex, Eldepryl, Marplan, Nardil, and Parnate This medicine may also interact with the following medications:  certain medicines for depression, anxiety, or psychotic disorders  propranolol This list may not describe all possible interactions. Give your health  care provider a list of all the medicines, herbs, non-prescription drugs, or dietary supplements you use. Also tell them if you smoke, drink alcohol, or use illegal drugs. Some items may interact with your medicine. What should I watch for while using this medicine? Visit your healthcare professional for regular checks on your progress. Tell your healthcare professional if your symptoms do not start to get better or if they get worse. You may get drowsy or dizzy. Do not drive, use machinery, or do anything that needs mental alertness until you know how this  medicine affects you. Do not stand up or sit up quickly, especially if you are an older patient. This reduces the risk of dizzy or fainting spells. Alcohol may interfere with the effect of this medicine. Your mouth may get dry. Chewing sugarless gum or sucking hard candy and drinking plenty of water may help. Contact your healthcare professional if the problem does not go away or is severe. If you take migraine medicines for 10 or more days a month, your migraines may get worse. Keep a diary of headache days and medicine use. Contact your healthcare professional if your migraine attacks occur more frequently. What side effects may I notice from receiving this medicine? Side effects that you should report to your doctor or health care professional as soon as possible:  allergic reactions like skin rash, itching or hives, swelling of the face, lips, or tongue  chest pain or chest tightness  signs and symptoms of a dangerous change in heartbeat or heart rhythm like chest pain; dizziness; fast, irregular heartbeat; palpitations; feeling faint or lightheaded; falls; breathing problems  signs and symptoms of a stroke like changes in vision; confusion; trouble speaking or understanding; severe headaches; sudden numbness or weakness of the face, arm or leg; trouble walking; dizziness; loss of balance or coordination  signs and symptoms of serotonin syndrome like irritable; confusion; diarrhea; fast or irregular heartbeat; muscle twitching; stiff muscles; trouble walking; sweating; high fever; seizures; chills; vomiting Side effects that usually do not require medical attention (report to your doctor or health care professional if they continue or are bothersome):  diarrhea  dizziness  drowsiness  dry mouth  headache  nausea, vomiting  pain, tingling, numbness in the hands or feet  stomach pain This list may not describe all possible side effects. Call your doctor for medical advice about side  effects. You may report side effects to FDA at 1-800-FDA-1088. Where should I keep my medicine? Keep out of the reach of children. Store at room temperature between 15 and 30 degrees C (59 and 86 degrees F). Protect from light and moisture. Throw away any unused medicine after the expiration date. NOTE: This sheet is a summary. It may not cover all possible information. If you have questions about this medicine, talk to your doctor, pharmacist, or health care provider.  2021 Elsevier/Gold Standard (2017-10-28 14:58:08)

## 2020-08-14 NOTE — Progress Notes (Signed)
[facility]    Provider:  Dr Jaynee Eagles Requesting Provider: [name], NP Primary Care Provider:  [name], NP  CC:  migraines  HPI:  Rhonda Mcgee is a 35 y.o. female here as requested by [name], NP for migraines. PMHx RA,Trichiasis of lower lid, discoid lupus erythematosus of right eyelid,  lupus, She has had migraines since she was young, she had an LP but this was when she was young. She has headaches every day, migraines at least 15 days of the month for over a year or longer, pulsating/pounding/throbbing, light and sound sensitivity, unilateral or both sides depends, behind the eyes mostly,can last 24-72 hours, nausea, vomiting, wakes with headaches and can be more severe supine. She used to see Rheumatology at [facility], she is eeing a Academic librarian, was on Plaquenil, seeing Dr. Benjamine Mola tomorrow Harrell Gave. She sees an ophthalmologist every 6 months. No other focal neurologic deficits, associated symptoms, inciting events or modifiable factors.  Reviewed notes, labs and imaging from outside physicians, which showed:  I reviewed ophthalmology notes from June 07, 2020 which showed no eye findings associated with worsening headaches: She presented with dry eye evaluation, flashes of light, headache and at that time was on Plaquenil, no signs of Plaquenil toxicity, visual acuity of 20/20 in each eye corrected, pupils equally round react to light, visual fields full, extraocular movements full, funduscopic exam showed normal right and left discs no papilledema in either eye, normal macula bilaterally, periphery normal bilaterally.  MRI brain 09/2010: [facility], reviewed reportThere is no acute infarct by diffusion weighted imaging. There no significant T2 abnormalities in the brain or brain stem to suggest  hemorrhage, tumor, infection, inflammation or demyelination. Ventricles, sulci and white matter tracks are normal. Posterior fossa is  normal.  Pituitary gland, cerebellar tonsils, corpus callosum other midline  structures are normal.   Minimal T1 signal in the superior sagittal sinus likely from in-plane  flow. The flow-voids in the major cerebral vessels and dural venous  sinuses appear patent. Orbits, paranasal sinuses and temporal bones are  normal.   IMPRESSION: Normal study. No acute intracranial abnormality. Negative for  hemorrhage , tumor or acute infarct.    From a thorough review of records, medications tried that can be used for migraines include: tylenol, ibuprofen,   decadron, reglan, sumatriptan("[place]", zofran, phenergan, blood pressure medications are contrainindicated due to low blood pressure.    Review of Systems: Patient complains of symptoms per HPI as well as the following symptoms: headaches. Pertinent negatives and positives per HPI. All others negative.   Social History   Socioeconomic History  . Marital status: Single    Spouse name: Not on file  . Number of children: 2  . Years of education: Not on file  . Highest education level: Some college, no degree  Occupational History  . Not on file  Tobacco Use  . Smoking status: Current Some Day Smoker    Types: Cigarettes  . Smokeless tobacco: Never Used  . Tobacco comment: trying to quit, no smoking for last 2 weeks  Vaping Use  . Vaping Use: Never used  Substance and Sexual Activity  . Alcohol use: Yes    Comment: occasional  . Drug use: No  . Sexual activity: Yes    Birth control/protection: None, Surgical  Other Topics Concern  . Not on file  Social History Narrative   Lives at home with her children   Right handed   Caffeine: 32 oz  sweet tea/day   Social Determinants of Health   Financial Resource Strain: Not on file  Food Insecurity: Not on file  Transportation Needs: Not on file  Physical Activity: Not on file  Stress: Not on file  Social Connections: Not on file  Intimate Partner Violence: Not on file     Family History  Adopted: Yes  Problem Relation Age of Onset  . Diabetes Mother   . Hypertension Mother   . Diabetes Sister   . Hypertension Sister   . Migraines Sister        with menstrual cycle, had vision loss with migraines  . Lupus Maternal Grandmother   . Esophageal cancer Neg Hx   . Rectal cancer Neg Hx   . Stomach cancer Neg Hx   . Colon cancer Neg Hx     Past Medical History:  Diagnosis Date  . Chlamydia   . GERD (gastroesophageal reflux disease)   . Gonorrhea   . Herpes genitalia   . Lupus ([place])   . Rheumatoid arthritis(714.0)     Patient Active Problem List   Diagnosis Date Noted  . Chronic migraine without aura, with intractable migraine, so stated, with status migrainosus 08/14/2020  . Abdominal pain, epigastric 12/04/2018  . GERD (gastroesophageal reflux disease) 11/10/2018  . Gastritis 11/10/2018  . NSAID induced gastritis 11/10/2018  . Intractable nausea and vomiting 11/09/2018  . Lupus ([place])   . Hypokalemia   . Vomiting     Past Surgical History:  Procedure Laterality Date  . ADENOIDECTOMY    . CHOLECYSTECTOMY    . DILATION AND CURETTAGE OF UTERUS     elective ab  . GALLBLADDER SURGERY    . TONSILLECTOMY    . TUBAL LIGATION      Current Outpatient Medications  Medication Sig Dispense Refill  . carboxymethylcellul-glycerin (REFRESH OPTIVE) 0.5-0.9 % ophthalmic solution Place 1 drop into both eyes 4 (four) times daily as needed for dry eyes. 15 mL 5  . LO LOESTRIN FE 1 MG-10 MCG / 10 MCG tablet Take 1 tablet by mouth daily. 90 tablet 3  . nortriptyline (PAMELOR) 25 MG capsule Start with 25mg  at bedtime and in 2 weeks increase to 50mg  at bedtime 180 capsule 3  . ondansetron (ZOFRAN-ODT) 4 MG disintegrating tablet Take 1-2 tablets (4-8 mg total) by mouth every 8 (eight) hours as needed. 30 tablet 3  . pantoprazole (PROTONIX) 40 MG tablet Take by mouth.    . rizatriptan (MAXALT-MLT) 10 MG disintegrating tablet Take 1 tablet (10 mg total) by  mouth as needed for migraine. May repeat in 2 hours if needed 9 tablet 11  . SUMAtriptan (IMITREX) 25 MG tablet Take 1 tablet (25 mg total) by mouth every 2 (two) hours as needed for migraine. May repeat in 2 hours if headache persists or recurs. 10 tablet 0   No current facility-administered medications for this visit.    Allergies as of 08/14/2020 - Review Complete 08/14/2020  Allergen Reaction Noted  . Cephalexin Hives 05/22/2011  . Keflex [cephalexin] Hives 01/03/2019    Vitals: BP 112/69 (BP Location: Right Arm, Patient Position: Sitting)   Pulse 95   Ht 5\' 5"  (1.651 m)   Wt 210 lb (95.3 kg)   LMP 07/23/2020 (Approximate)   BMI 34.95 kg/m  Last Weight:  Wt Readings from Last 1 Encounters:  08/14/20 210 lb (95.3 kg)   Last Height:   Ht Readings from Last 1 Encounters:  08/14/20 5\' 5"  (1.651 m)  Physical exam: Exam: Gen: NAD, conversant, well nourised, obese, well groomed                     CV: RRR, no MRG. No Carotid Bruits. No peripheral edema, warm, nontender Eyes: Conjunctivae clear without exudates or hemorrhage  Neuro: Detailed Neurologic Exam  Speech:    Speech is normal; fluent and spontaneous with normal comprehension.  Cognition:    The patient is oriented to person, place, and time;     recent and remote memory intact;     language fluent;     normal attention, concentration,     fund of knowledge Cranial Nerves:    The pupils are equal, round, and reactive to light. The fundi are normal and spontaneous venous pulsations are present. Visual fields are full to finger confrontation. Extraocular movements are intact. Trigeminal sensation is intact and the muscles of mastication are normal. The face is symmetric. The palate elevates in the midline. Hearing intact. Voice is normal. Shoulder shrug is normal. The tongue has normal motion without fasciculations.   Coordination:    Normal finger to nose and heel to shin. Normal rapid alternating movements.    Gait:    Heel-toe and tandem gait are normal.   Motor Observation:    No asymmetry, no atrophy, and no involuntary movements noted. Tone:    Normal muscle tone.    Posture:    Posture is normal. normal erect    Strength: right leg proximal weakness 4/5 otherwise strength is V/V in the upper and lower limbs.      Sensation: intact to LT     Reflex Exam:  DTR's:    Deep tendon reflexes in the upper and lower extremities are normal bilaterally.   Toes:    The toes are downgoing bilaterally.   Clonus:    Clonus is absent.    Assessment/Plan:  35 year old with daily intractable headaches and migraines.  MRI and MRA of the head to rule out causes of headaches due to concerning symptoms vision changes, positional/morning headaches, intractable continuous severe worsening headaches, she has Lupus need to check for vasculitis or venous thrombosis(focal weakness right leg) or other sequelae of Lupus that can cause headaches.   Emergency: Rizatriptan (failed sumatriptan). Could try some other triptans or then go to nurtec or ubrelvy.  Preventative: Try Nortriptyline. May help with mood as well. Effexor is another option but if this does not help I would try Topamax next and if still suffering would go to [employer]. (I would check with Rheumatologist)  Orders Placed This Encounter  Procedures  . MR BRAIN W WO CONTRAST  . MR ANGIO HEAD WO CONTRAST   Meds ordered this encounter  Medications  . nortriptyline (PAMELOR) 25 MG capsule    Sig: Start with 25mg  at bedtime and in 2 weeks increase to 50mg  at bedtime    Dispense:  180 capsule    Refill:  3  . rizatriptan (MAXALT-MLT) 10 MG disintegrating tablet    Sig: Take 1 tablet (10 mg total) by mouth as needed for migraine. May repeat in 2 hours if needed    Dispense:  9 tablet    Refill:  11  . ondansetron (ZOFRAN-ODT) 4 MG disintegrating tablet    Sig: Take 1-2 tablets (4-8 mg total) by mouth every 8 (eight) hours as needed.     Dispense:  30 tablet    Refill:  3   Discussed: To prevent or relieve headaches, try  the following: Cool Compress. Lie down and place a cool compress on your head.  Avoid headache triggers. If certain foods or odors seem to have triggered your migraines in the past, avoid them. A headache diary might help you identify triggers.  Include physical activity in your daily routine. Try a daily walk or other moderate aerobic exercise.  Manage stress. Find healthy ways to cope with the stressors, such as delegating tasks on your to-do list.  Practice relaxation techniques. Try deep breathing, yoga, massage and visualization.  Eat regularly. Eating regularly scheduled meals and maintaining a healthy diet might help prevent headaches. Also, drink plenty of fluids.  Follow a regular sleep schedule. Sleep deprivation might contribute to headaches Consider biofeedback. With this mind-body technique, you learn to control certain bodily functions -- such as muscle tension, heart rate and blood pressure -- to prevent headaches or reduce headache pain.    Proceed to emergency room if you experience new or worsening symptoms or symptoms do not resolve, if you have new neurologic symptoms or if headache is severe, or for any concerning symptom.   Provided education and documentation from American headache Society toolbox including articles on: chronic migraine medication overuse headache, chronic migraines, prevention of migraines, behavioral and other nonpharmacologic treatments for headache.   Cc: [name], NP,  [name], NP  Sarina Ill, MD  [facility] [address] [place], [place] [address]  Phone [phone] Fax [phone]

## 2020-08-15 ENCOUNTER — Telehealth: Payer: Self-pay

## 2020-08-15 ENCOUNTER — Encounter: Payer: Self-pay | Admitting: Internal Medicine

## 2020-08-15 ENCOUNTER — Other Ambulatory Visit: Payer: Self-pay

## 2020-08-15 ENCOUNTER — Ambulatory Visit (INDEPENDENT_AMBULATORY_CARE_PROVIDER_SITE_OTHER): Payer: BC Managed Care – PPO | Admitting: Internal Medicine

## 2020-08-15 ENCOUNTER — Telehealth: Payer: Self-pay | Admitting: Neurology

## 2020-08-15 VITALS — BP 114/76 | HR 89 | Ht 65.5 in | Wt 210.6 lb

## 2020-08-15 DIAGNOSIS — Z79899 Other long term (current) drug therapy: Secondary | ICD-10-CM | POA: Diagnosis not present

## 2020-08-15 DIAGNOSIS — H539 Unspecified visual disturbance: Secondary | ICD-10-CM | POA: Diagnosis not present

## 2020-08-15 DIAGNOSIS — M329 Systemic lupus erythematosus, unspecified: Secondary | ICD-10-CM | POA: Diagnosis not present

## 2020-08-15 DIAGNOSIS — H01129 Discoid lupus erythematosus of unspecified eye, unspecified eyelid: Secondary | ICD-10-CM | POA: Diagnosis not present

## 2020-08-15 MED ORDER — HYDROXYCHLOROQUINE SULFATE 200 MG PO TABS: 200.0000 mg | ORAL_TABLET | Freq: Every day | ORAL | 0 refills | Status: DC

## 2020-08-15 NOTE — Patient Instructions (Signed)
Plan is to restart hydroxychloroquine 200 mg (1 tablet) daily at this time.  I see no problem with use of steroid or ACTHAR injection into skin lesions for the discoid lupus inflammation, but I do not personally preform injections at this location.  I recommend taking a daily vitamin D supplement of1000 units (20 mcgs).  Try to limit sun UV light exposure this can contribute to skin disease activity. Sunglasses, hats, sleeves, or SPF 50+ sunscreen are all recommended.  Tobacco smoking reduction is also recommended to reduce discoid lupus activity.   Hydroxychloroquine tablets What is this medicine? HYDROXYCHLOROQUINE (hye drox ee KLOR oh kwin) is used to treat rheumatoid arthritis and systemic lupus erythematosus. It is also used to treat malaria. This medicine may be used for other purposes; ask your health care provider or pharmacist if you have questions. COMMON BRAND NAME(S): Plaquenil, Quineprox What should I tell my health care provider before I take this medicine? They need to know if you have any of these conditions:  diabetes  eye disease, vision problems  G6PD deficiency  heart disease  history of irregular heartbeat  if you often drink alcohol  kidney disease  liver disease  porphyria  psoriasis  an unusual or allergic reaction to chloroquine, hydroxychloroquine, other medicines, foods, dyes, or preservatives  pregnant or trying to get pregnant  breast-feeding How should I use this medicine? Take this medicine by mouth with a glass of water. Take it as directed on the prescription label. Do not cut, crush or chew this medicine. Swallow the tablets whole. Take it with food. Do not take it more than directed. Take all of this medicine unless your health care provider tells you to stop it early. Keep taking it even if you think you are better. Take products with antacids in them at a different time of day than this medicine. Take this medicine 4 hours before or 4  hours after antacids. Talk to your health care provider if you have questions. Talk to your pediatrician regarding the use of this medicine in children. While this drug may be prescribed for selected conditions, precautions do apply. Overdosage: If you think you have taken too much of this medicine contact a poison control center or emergency room at once. NOTE: This medicine is only for you. Do not share this medicine with others. What if I miss a dose? If you miss a dose, take it as soon as you can. If it is almost time for your next dose, take only that dose. Do not take double or extra doses. What may interact with this medicine? Do not take this medicine with any of the following medications:  cisapride  dronedarone  pimozide  thioridazine This medicine may also interact with the following medications:  ampicillin  antacids  cimetidine  cyclosporine  digoxin  kaolin  medicines for diabetes, like insulin, glipizide, glyburide  medicines for seizures like carbamazepine, phenobarbital, phenytoin  mefloquine  methotrexate  other medicines that prolong the QT interval (cause an abnormal heart rhythm)  praziquantel This list may not describe all possible interactions. Give your health care provider a list of all the medicines, herbs, non-prescription drugs, or dietary supplements you use. Also tell them if you smoke, drink alcohol, or use illegal drugs. Some items may interact with your medicine. What should I watch for while using this medicine? Visit your health care provider for regular checks on your progress. Tell your health care provider if your symptoms do not start to get better  or if they get worse. You may need blood work done while you are taking this medicine. If you take other medicines that can affect heart rhythm, you may need more testing. Talk to your health care provider if you have questions. Your vision may be tested before and during use of this  medicine. Tell your health care provider right away if you have any change in your eyesight. This medicine may cause serious skin reactions. They can happen weeks to months after starting the medicine. Contact your health care provider right away if you notice fevers or flu-like symptoms with a rash. The rash may be red or purple and then turn into blisters or peeling of the skin. Or, you might notice a red rash with swelling of the face, lips or lymph nodes in your neck or under your arms. If you or your family notice any changes in your behavior, such as new or worsening depression, thoughts of harming yourself, anxiety, or other unusual or disturbing thoughts, or memory loss, call your health care provider right away. What side effects may I notice from receiving this medicine? Side effects that you should report to your doctor or health care professional as soon as possible:  allergic reactions (skin rash, itching or hives; swelling of the face, lips, or tongue)  changes in vision  decreased hearing, ringing in the ears  heartbeat rhythm changes (trouble breathing; chest pain; dizziness; fast, irregular heartbeat; feeling faint or lightheaded, falls)  liver injury (dark yellow or brown urine; general ill feeling or flu-like symptoms; loss of appetite, right upper belly pain; unusually weak or tired, yellowing of the eyes or skin)  low blood sugar (feeling anxious; confusion; dizziness; increased hunger; unusually weak or tired; increased sweating; shakiness; cold, clammy skin; irritable; headache; blurred vision; fast heartbeat; loss of consciousness)  low red blood cell counts (trouble breathing; feeling faint; lightheaded, falls; unusually weak or tired)  muscle weakness  pain, tingling, numbness in the hands or feet  rash, fever, and swollen lymph nodes  redness, blistering, peeling or loosening of the skin, including inside the mouth  suicidal thoughts, mood  changes  uncontrollable head, mouth, neck, arm, or leg movements  unusual bruising or bleeding Side effects that usually do not require medical attention (report to your doctor or health care professional if they continue or are bothersome):  diarrhea  hair loss  irritable This list may not describe all possible side effects. Call your doctor for medical advice about side effects. You may report side effects to FDA at 1-800-FDA-1088. Where should I keep my medicine? Keep out of the reach of children and pets. Store at room temperature up to 30 degrees C (86 degrees F). Protect from light. Get rid of any unused medicine after the expiration date. To get rid of medicines that are no longer needed or have expired:  Take the medicine to a medicine take-back program. Check with your pharmacy or law enforcement to find a location.  If you cannot return the medicine, check the label or package insert to see if the medicine should be thrown out in the garbage or flushed down the toilet. If you are not sure, ask your health care provider. If it is safe to put it in the trash, empty the medicine out of the container. Mix the medicine with cat litter, dirt, coffee grounds, or other unwanted substance. Seal the mixture in a bag or container. Put it in the trash. NOTE: This sheet is a summary. It  may not cover all possible information. If you have questions about this medicine, talk to your doctor, pharmacist, or health care provider.  2021 Elsevier/Gold Standard (2019-10-04 15:07:49)

## 2020-08-15 NOTE — Telephone Encounter (Signed)
Dillon Bjork: [id number] (exp. 08/15/20 to 02/10/21) & [facility] pending faxed notes

## 2020-08-15 NOTE — Progress Notes (Signed)
Office Visit Note  Patient: Rhonda Mcgee             Date of Birth: February 21, 1986           MRN: [id number]             PCP: [name], NP Referring: [name], NP Visit Date: 08/15/2020 Occupation: CNA  Subjective:  New Patient (Initial Visit) (Patient was previously on PLQ for RA. Patient quit taking PLQ within the last year. Previously patient was Rxed PLQ by the emergency department because she did not have health insurance. Patient quit taking the medication because she could no longer get Rx. When patient first began taking the medication she was having blindness, she decreased the dose and noticed improvement in side effects. )   History of Present Illness: Rhonda Mcgee is a 35 y.o. female here for evaluation of possible systemic lupus. 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  She was previously seen with rheumatology with [facility] symptoms treated on hydroxychloroquine she describes visual changes on 400 mg dose although was able to resume 200 mg daily dose apparently without return of visual problems.  Reported disease manifestations at that time included photosensitive rashes, mucosal ulcers, and sicca syndrome.  Currently she is having some arthralgias also a significant amount of dry eye symptoms. The biggest complaint is the continued skin disease especially with involvement of her lower eyelids bilaterally and central face.  She is seeing ophthalmology and has had discussion of Acthar injections for the ongoing symptoms.  She does continue to smoke cigarettes.  Labs reviewed Urinalysis 08/06/20 negative for protein or blood  Imaging reviewed CXR 01/2020 negative for airspace disease  Activities of Daily Living:  Patient reports morning stiffness for several hours.   Patient Reports nocturnal pain.  Difficulty dressing/grooming: Denies Difficulty climbing stairs: Reports Difficulty getting out of  chair: Denies Difficulty using hands for taps, buttons, cutlery, and/or writing: Reports  Review of Systems  Constitutional: Positive for fatigue.  HENT: Positive for mouth dryness. Negative for mouth sores and nose dryness.   Eyes: Positive for pain, itching, visual disturbance and dryness.  Respiratory: Positive for cough, hemoptysis, shortness of breath and difficulty breathing.   Cardiovascular: Positive for chest pain, palpitations and swelling in legs/feet.  Gastrointestinal: Positive for abdominal pain and diarrhea. Negative for blood in stool and constipation.  Endocrine: Negative for increased urination.  Genitourinary: Negative for painful urination.  Musculoskeletal: Positive for arthralgias, joint pain, joint swelling, myalgias, muscle weakness, morning stiffness, muscle tenderness and myalgias.  Skin: Positive for color change, rash and redness.  Allergic/Immunologic: Positive for susceptible to infections.  Neurological: Positive for dizziness, numbness, headaches, memory loss and weakness.  Hematological: Positive for swollen glands.  Psychiatric/Behavioral: Positive for sleep disturbance. Negative for confusion.    PMFS History:  Patient Active Problem List   Diagnosis Date Noted  . High risk medication use 08/15/2020  . Chronic migraine without aura, with intractable migraine, so stated, with status migrainosus 08/14/2020  . 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 Abdominal pain, epigastric 12/04/2018  . GERD (gastroesophageal reflux disease) 11/10/2018  . Gastritis 11/10/2018  . NSAID induced gastritis 11/10/2018  . Intractable nausea and vomiting 11/09/2018  . Lupus ([place])   . Hypokalemia   . Vomiting   . 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 Genital herpes 11/02/2013  . 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 Visual disturbances  12/06/2010    Past Medical History:  Diagnosis Date  . Chlamydia   . GERD  (gastroesophageal reflux disease)   . Gonorrhea   . Herpes genitalia   . Lupus ([place])   . Rheumatoid arthritis(714.0)     Family History  Adopted: Yes  Problem Relation Age of Onset  . Diabetes Mother   . Hypertension Mother   . Diabetes Sister   . Hypertension Sister   . Migraines Sister        with menstrual cycle, had vision loss with migraines  . Lupus Maternal Grandmother   . Esophageal cancer Neg Hx   . Rectal cancer Neg Hx   . Stomach cancer Neg Hx   . Colon cancer Neg Hx    Past Surgical History:  Procedure Laterality Date  . ADENOIDECTOMY    . CHOLECYSTECTOMY    . DILATION AND CURETTAGE OF UTERUS     elective ab  . GALLBLADDER SURGERY    . TONSILLECTOMY    . TUBAL LIGATION     Social History   Social History Narrative   Lives at home with her children   Right handed   Caffeine: 32 oz sweet tea/day   Immunization History  Administered Date(s) Administered  . PFIZER(Purple Top)SARS-COV-2 Vaccination 07/22/2019, 08/17/2019  . PPD Test 06/24/2014, 07/12/2014     Objective: Vital Signs: BP 114/76 (BP Location: Right Arm, Patient Position: Sitting, Cuff Size: Normal)   Pulse 89   Ht 5' 5.5" (1.664 m)   Wt 210 lb 9.6 oz (95.5 kg)   LMP 07/23/2020 (Approximate)   BMI 34.51 kg/m    Physical Exam HENT:     Mouth/Throat:     Mouth: Mucous membranes are moist.     Pharynx: Oropharynx is clear.  Eyes:     Comments: Sclera appear normal Mixed hypo and hyperpigmented rash skin changes over lower eyelids bilaterally, central facial clearing except small hyperpigmented patches  Skin:    General: Skin is warm and dry.     Comments: Area of hyperpigmented skin on upper arm No digital pitting  Neurological:     General: No focal deficit present.     Mental Status: She is alert.  Psychiatric:        Mood and Affect: Mood normal.     Musculoskeletal Exam:  Neck full ROM no tenderness Shoulders full ROM no tenderness or swelling Elbows full ROM no  tenderness or swelling Wrists full ROM no tenderness or swelling Fingers full ROM no tenderness or swelling No paraspinal tenderness to palpation over upper and lower back Hip normal internal and external rotation without pain, no tenderness to lateral hip palpation Knees full ROM no tenderness or swelling Ankles full ROM no tenderness or swelling MTPs full ROM no tenderness or swelling  Investigation: No additional findings.  Imaging: No results found.  Recent Labs: Lab Results  Component Value Date   WBC 4.1 08/15/2020   HGB 11.5 (L) 08/15/2020   PLT 333 08/15/2020   NA 138 08/15/2020   K 4.4 08/15/2020   CL 107 08/15/2020   CO2 23 08/15/2020   GLUCOSE 80 08/15/2020   BUN 10 08/15/2020   CREATININE 0.86 08/15/2020   BILITOT 0.5 08/15/2020   ALKPHOS 75 05/22/2020   AST 17 08/15/2020   ALT 14 08/15/2020   PROT 7.7 08/15/2020   ALBUMIN 3.5 05/22/2020   CALCIUM 9.3 08/15/2020   GFRAA 103 08/15/2020    Speciality Comments: No specialty comments available.  Procedures:  No  procedures performed Allergies: Cephalexin and Keflex [cephalexin]   Assessment / Plan:     Visit Diagnoses: Lupus ([place]) - Plan: Anti-DNA antibody, double-stranded, C3 and C4, Rheumatoid factor, Cyclic citrul peptide antibody, IgG, Sedimentation rate, CBC with Differential/Platelet, COMPLETE METABOLIC PANEL WITH GFR, hydroxychloroquine (PLAQUENIL) 200 MG tablet  Lupus clinical history and previous pathology consistent with chronic cutaneous lupus we discussed the most commonly does not progress to involve systemic lupus limited to the distribution that she currently has.  Checking for dsDNA, complements also sedimentation rate, CBC, CMP.  Arthralgias not clear if related since less commonly with cutaneous disease.  So checking rheumatoid factor and CCP antibodies.  Plan to restart Plaquenil at 200 mg daily.  High risk medication use - Plan: CBC with Differential/Platelet, COMPLETE METABOLIC PANEL WITH  GFR  Checking baseline CBC and CMP for restarting hydroxychloroquine.  Also discussed importance for annual ophthalmology exam for retinal toxicity, she already has ongoing care with ophthalmology for her other symptoms.  Orders: Orders Placed This Encounter  Procedures  . Anti-DNA antibody, double-stranded  . C3 and C4  . Rheumatoid factor  . Cyclic citrul peptide antibody, IgG  . Sedimentation rate  . CBC with Differential/Platelet  . COMPLETE METABOLIC PANEL WITH GFR   Meds ordered this encounter  Medications  . hydroxychloroquine (PLAQUENIL) 200 MG tablet    Sig: Take 1 tablet (200 mg total) by mouth daily.    Dispense:  90 tablet    Refill:  0    Follow-Up Instructions: Return in about 2 months (around 10/15/2020) for Lupus on HCQ restart 8wks f/u.   Collier Salina, MD  Note - This record has been created using [employer].  Chart creation errors have been sought, but may not always  have been located. Such creation errors do not reflect on  the standard of medical care.

## 2020-08-15 NOTE — Telephone Encounter (Signed)
I have completed a PA for rizatriptan 10mg  on CMM, Key: [id number].  Your information has been submitted to [employer]. [employer] is reviewing the PA request and you will receive an electronic response.

## 2020-08-16 ENCOUNTER — Other Ambulatory Visit: Payer: Self-pay | Admitting: Obstetrics and Gynecology

## 2020-08-16 LAB — CBC WITH DIFFERENTIAL/PLATELET
Absolute Monocytes: 328 cells/uL (ref 200–950)
Basophils Absolute: 41 cells/uL (ref 0–200)
Basophils Relative: 1 %
Eosinophils Absolute: 90 cells/uL (ref 15–500)
Eosinophils Relative: 2.2 %
HCT: 37.5 % (ref 35.0–45.0)
Hemoglobin: 11.5 g/dL — ABNORMAL LOW (ref 11.7–15.5)
Lymphs Abs: 2308 cells/uL (ref 850–3900)
MCH: 24 pg — ABNORMAL LOW (ref 27.0–33.0)
MCHC: 30.7 g/dL — ABNORMAL LOW (ref 32.0–36.0)
MCV: 78.3 fL — ABNORMAL LOW (ref 80.0–100.0)
MPV: 10.1 fL (ref 7.5–12.5)
Monocytes Relative: 8 %
Neutro Abs: 1333 cells/uL — ABNORMAL LOW (ref 1500–7800)
Neutrophils Relative %: 32.5 %
Platelets: 333 10*3/uL (ref 140–400)
RBC: 4.79 10*6/uL (ref 3.80–5.10)
RDW: 16.4 % — ABNORMAL HIGH (ref 11.0–15.0)
Total Lymphocyte: 56.3 %
WBC: 4.1 10*3/uL (ref 3.8–10.8)

## 2020-08-16 LAB — COMPLETE METABOLIC PANEL WITH GFR
AG Ratio: 1.3 (calc) (ref 1.0–2.5)
ALT: 14 U/L (ref 6–29)
AST: 17 U/L (ref 10–30)
Albumin: 4.4 g/dL (ref 3.6–5.1)
Alkaline phosphatase (APISO): 85 U/L (ref 31–125)
BUN: 10 mg/dL (ref 7–25)
CO2: 23 mmol/L (ref 20–32)
Calcium: 9.3 mg/dL (ref 8.6–10.2)
Chloride: 107 mmol/L (ref 98–110)
Creat: 0.86 mg/dL (ref 0.50–1.10)
GFR, Est African American: 103 mL/min/{1.73_m2} (ref >=60)
GFR, Est Non African American: 89 mL/min/{1.73_m2} (ref >=60)
Globulin: 3.3 g/dL (calc) (ref 1.9–3.7)
Glucose, Bld: 80 mg/dL (ref 65–99)
Potassium: 4.4 mmol/L (ref 3.5–5.3)
Sodium: 138 mmol/L (ref 135–146)
Total Bilirubin: 0.5 mg/dL (ref 0.2–1.2)
Total Protein: 7.7 g/dL (ref 6.1–8.1)

## 2020-08-16 LAB — RHEUMATOID FACTOR: Rheumatoid fact SerPl-aCnc: <14 IU/mL (ref <14)

## 2020-08-16 LAB — CYCLIC CITRUL PEPTIDE ANTIBODY, IGG: Cyclic Citrullin Peptide Ab: <16 UNITS

## 2020-08-16 LAB — SEDIMENTATION RATE: Sed Rate: 41 mm/h — ABNORMAL HIGH (ref 0–20)

## 2020-08-16 LAB — C3 AND C4
C3 Complement: 160 mg/dL (ref 83–193)
C4 Complement: 25 mg/dL (ref 15–57)

## 2020-08-16 LAB — ANTI-DNA ANTIBODY, DOUBLE-STRANDED: ds DNA Ab: <1 IU/mL

## 2020-08-16 NOTE — Patient Outreach (Signed)
Care Coordination  08/16/2020  Rhonda Mcgee 1985-09-25 [id number]    Medicaid Managed Care   Unsuccessful Outreach Note  08/16/2020 Name: Rhonda Mcgee MRN: [id number] DOB: 01/25/1986  Referred by: [name], NP Reason for referral : High Risk Managed Medicaid (Unsuccessful telephone outreach)   An unsuccessful telephone outreach was attempted today. The patient was referred to the case management team for assistance with care management and care coordination.   Follow Up Plan: A member of the Managed Medicaid  care management team will reach out to the patient again over the next 7 days.   Aida Raider RN, BSN [place]  [facility] - Managed Medicaid High Risk [phone].

## 2020-08-16 NOTE — Patient Instructions (Signed)
Hi Ms. Rhonda Mcgee, sorry I missed you today- as a part of your Medicaid benefit, you are eligible for care management and care coordination services at no cost or copay. I was unable to reach you by phone today but would be happy to help you with your health related needs. Please feel free to call me at [phone].  A member of the Managed Medicaid care management team will reach out to you again over the next 7 days.   Aida Raider RN, BSN [place]  [facility] - Managed Medicaid High Risk [phone].

## 2020-08-18 NOTE — Progress Notes (Signed)
Lab results show markers for inflammation are increased. She has mild anemia possibly low iron related. There is no evidence of kidney or liver dysfunction. I recommend going back onto the low dose hydroxychloroquine for now like we discussed and see how this does.

## 2020-08-21 NOTE — Telephone Encounter (Signed)
MCD wellcare auth: Brain auth: [id number] (exp. 08/15/20 to 10/14/20) & MRA Head auth: [id number] (exp. 08/15/20 to 10/14/20) [facility] are who is in in network with both insurance plans. They will reach out to the patient to schedule.

## 2020-08-21 NOTE — Telephone Encounter (Signed)
schedule for 5/4 930AM

## 2020-08-23 ENCOUNTER — Other Ambulatory Visit: Payer: Self-pay

## 2020-08-23 ENCOUNTER — Ambulatory Visit (INDEPENDENT_AMBULATORY_CARE_PROVIDER_SITE_OTHER): Payer: BC Managed Care – PPO | Admitting: Nurse Practitioner

## 2020-08-23 ENCOUNTER — Encounter: Payer: Self-pay | Admitting: Nurse Practitioner

## 2020-08-23 VITALS — BP 132/90 | HR 80 | Temp 97.4°F | Ht 65.5 in | Wt 213.0 lb

## 2020-08-23 DIAGNOSIS — D509 Iron deficiency anemia, unspecified: Secondary | ICD-10-CM | POA: Diagnosis not present

## 2020-08-23 DIAGNOSIS — H01129 Discoid lupus erythematosus of unspecified eye, unspecified eyelid: Secondary | ICD-10-CM | POA: Diagnosis not present

## 2020-08-23 NOTE — Patient Instructions (Signed)
[employer] medicine (25th ed., pp. [phone]). [place], [place]: Elsevier.">  Anemia  Anemia is a condition in which there is not enough red blood cells or hemoglobin in the blood. Hemoglobin is a substance in red blood cells that carries oxygen. When you do not have enough red blood cells or hemoglobin (are anemic), your body cannot get enough oxygen and your organs may not work properly. As a result, you may feel very tired or have other problems. What are the causes? Common causes of anemia include:  Excessive bleeding. Anemia can be caused by excessive bleeding inside or outside the body, including bleeding from the intestines or from heavy menstrual periods in females.  Poor nutrition.  Long-lasting (chronic) kidney, thyroid, and liver disease.  Bone marrow disorders, spleen problems, and blood disorders.  Cancer and treatments for cancer.  HIV (human immunodeficiency virus) and AIDS (acquired immunodeficiency syndrome).  Infections, medicines, and autoimmune disorders that destroy red blood cells. What are the signs or symptoms? Symptoms of this condition include:  Minor weakness.  Dizziness.  Headache, or difficulties concentrating and sleeping.  Heartbeats that feel irregular or faster than normal (palpitations).  Shortness of breath, especially with exercise.  Pale skin, lips, and nails, or cold hands and feet.  Indigestion and nausea. Symptoms may occur suddenly or develop slowly. If your anemia is mild, you may not have symptoms. How is this diagnosed? This condition is diagnosed based on blood tests, your medical history, and a physical exam. In some cases, a test may be needed in which cells are removed from the soft tissue inside of a bone and looked at under a microscope (bone marrow biopsy). Your health care provider may also check your stool (feces) for blood and may do additional testing to look for the cause of your bleeding. Other tests may  include:  Imaging tests, such as a CT scan or MRI.  A procedure to see inside your esophagus and stomach (endoscopy).  A procedure to see inside your colon and rectum (colonoscopy). How is this treated? Treatment for this condition depends on the cause. If you continue to lose a lot of blood, you may need to be treated at a hospital. Treatment may include:  Taking supplements of iron, vitamin Q68, or folic acid.  Taking a hormone medicine (erythropoietin) that can help to stimulate red blood cell growth.  Having a blood transfusion. This may be needed if you lose a lot of blood.  Making changes to your diet.  Having surgery to remove your spleen. Follow these instructions at home:  Take over-the-counter and prescription medicines only as told by your health care provider.  Take supplements only as told by your health care provider.  Follow any diet instructions that you were given by your health care provider.  Keep all follow-up visits as told by your health care provider. This is important. Contact a health care provider if:  You develop new bleeding anywhere in the body. Get help right away if:  You are very weak.  You are short of breath.  You have pain in your abdomen or chest.  You are dizzy or feel faint.  You have trouble concentrating.  You have bloody stools, black stools, or tarry stools.  You vomit repeatedly or you vomit up blood. These symptoms may represent a serious problem that is an emergency. Do not wait to see if the symptoms will go away. Get medical help right away. Call your local emergency services (911 in the [place] Do not  drive yourself to the hospital. Summary  Anemia is a condition in which you do not have enough red blood cells or enough of a substance in your red blood cells that carries oxygen (hemoglobin).  Symptoms may occur suddenly or develop slowly.  If your anemia is mild, you may not have symptoms.  This condition is  diagnosed with blood tests, a medical history, and a physical exam. Other tests may be needed.  Treatment for this condition depends on the cause of the anemia. This information is not intended to replace advice given to you by your health care provider. Make sure you discuss any questions you have with your health care provider. Document Revised: 03/23/2019 Document Reviewed: 03/23/2019 Elsevier Patient Education  2021 Elsevier Inc.  

## 2020-08-23 NOTE — Progress Notes (Signed)
[place] [place], [place]  [address] Phone:  [phone]   Fax:  [phone]   Established Patient Office Visit  Subjective:  Patient ID: Rhonda Mcgee, female    DOB: 1985/07/25  Age: 35 y.o. MRN: [id number]  CC:  Chief Complaint  Patient presents with  . Follow-up    Follow up , pt has no new concerns     HPI Rhonda Mcgee presents for follow up. She  has a past medical history of  Lupus ([place]), Rheumatoid arthritis(714.0). GERD (gastroesophageal reflux disease), Gonorrhea, and  Herpes genitalia,   She was seen by neuotology and GYN.  She is currently taking oral contraceptive Lo Loestrin plus iron.  She is status post tubal ligation.  She would prefer not to take this as a form of treatment for her iron deficiency. She denies any concerns today.  She would like a referral to dermatology for evaluation.   Past Medical History:  Diagnosis Date  . Chlamydia   . GERD (gastroesophageal reflux disease)   . Gonorrhea   . Herpes genitalia   . Lupus ([place])   . Rheumatoid arthritis(714.0)     Past Surgical History:  Procedure Laterality Date  . ADENOIDECTOMY    . CHOLECYSTECTOMY    . DILATION AND CURETTAGE OF UTERUS     elective ab  . GALLBLADDER SURGERY    . TONSILLECTOMY    . TUBAL LIGATION      Family History  Adopted: Yes  Problem Relation Age of Onset  . Diabetes Mother   . Hypertension Mother   . Diabetes Sister   . Hypertension Sister   . Migraines Sister        with menstrual cycle, had vision loss with migraines  . Lupus Maternal Grandmother   . Esophageal cancer Neg Hx   . Rectal cancer Neg Hx   . Stomach cancer Neg Hx   . Colon cancer Neg Hx     Social History   Socioeconomic History  . Marital status: Single    Spouse name: Not on file  . Number of children: 2  . Years of education: Not on file  . Highest education level: Some college, no degree  Occupational History  . Not on file  Tobacco Use   . Smoking status: Current Some Day Smoker    Types: Cigarettes  . Smokeless tobacco: Never Used  . Tobacco comment: trying to quit, no smoking for last 2 weeks  Vaping Use  . Vaping Use: Never used  Substance and Sexual Activity  . Alcohol use: Yes    Comment: occasional  . Drug use: No  . Sexual activity: Yes    Birth control/protection: None, Surgical  Other Topics Concern  . Not on file  Social History Narrative   Lives at home with her children   Right handed   Caffeine: 32 oz sweet tea/day   Social Determinants of Health   Financial Resource Strain: Not on file  Food Insecurity: Not on file  Transportation Needs: Not on file  Physical Activity: Not on file  Stress: Not on file  Social Connections: Not on file  Intimate Partner Violence: Not on file    Outpatient Medications Prior to Visit  Medication Sig Dispense Refill  . azithromycin (AZASITE) 1 % ophthalmic solution Apply to eye.    . hydroxychloroquine (PLAQUENIL) 200 MG tablet Take 1 tablet (200 mg total) by mouth daily. 90 tablet 0  . ketoconazole (NIZORAL) 2 %  shampoo Apply topically daily.    . LO LOESTRIN FE 1 MG-10 MCG / 10 MCG tablet Take 1 tablet by mouth daily. 90 tablet 3  . nortriptyline (PAMELOR) 25 MG capsule Start with 25mg  at bedtime and in 2 weeks increase to 50mg  at bedtime 180 capsule 3  . ondansetron (ZOFRAN-ODT) 4 MG disintegrating tablet Take 1-2 tablets (4-8 mg total) by mouth every 8 (eight) hours as needed. 30 tablet 3  . pantoprazole (PROTONIX) 40 MG tablet Take by mouth.    . chlorhexidine (PERIDEX) 0.12 % solution SMARTSIG:By Mouth    . rizatriptan (MAXALT-MLT) 10 MG disintegrating tablet Take 1 tablet (10 mg total) by mouth as needed for migraine. May repeat in 2 hours if needed 9 tablet 11  . SUMAtriptan (IMITREX) 25 MG tablet Take 1 tablet (25 mg total) by mouth every 2 (two) hours as needed for migraine. May repeat in 2 hours if headache persists or recurs. 10 tablet 0   No  facility-administered medications prior to visit.    Allergies  Allergen Reactions  . Cephalexin Hives  . Keflex [Cephalexin] Hives    ROS Review of Systems    Objective:    Physical Exam Constitutional:      General: She is not in acute distress.    Appearance: She is not ill-appearing, toxic-appearing or diaphoretic.  HENT:     Head: Normocephalic and atraumatic.     Nose: Nose normal.     Mouth/Throat:     Mouth: Mucous membranes are moist.  Cardiovascular:     Rate and Rhythm: Normal rate and regular rhythm.     Pulses: Normal pulses.     Heart sounds: Normal heart sounds.  Pulmonary:     Effort: Pulmonary effort is normal.     Breath sounds: Normal breath sounds.  Abdominal:     Palpations: Abdomen is soft.  Musculoskeletal:        General: Normal range of motion.     Cervical back: Normal range of motion.  Skin:    General: Skin is warm.     Capillary Refill: Capillary refill takes less than 2 seconds.     Findings: Rash present.  Neurological:     General: No focal deficit present.     Mental Status: She is alert and oriented to person, place, and time.  Psychiatric:        Mood and Affect: Mood normal.        Behavior: Behavior normal.        Thought Content: Thought content normal.        Judgment: Judgment normal.     BP 132/90 (BP Location: Left Arm, Patient Position: Sitting, Cuff Size: Large)   Pulse 80   Temp (!) 97.4 F (36.3 C) (Temporal)   Ht 5' 5.5" (1.664 m)   Wt 213 lb (96.6 kg)   LMP 08/17/2020   SpO2 99%   BMI 34.91 kg/m  Wt Readings from Last 3 Encounters:  08/23/20 213 lb (96.6 kg)  08/15/20 210 lb 9.6 oz (95.5 kg)  08/14/20 210 lb (95.3 kg)     There are no preventive care reminders to display for this patient.  There are no preventive care reminders to display for this patient.  No results found for: TSH Lab Results  Component Value Date   WBC 4.1 08/15/2020   HGB 11.5 (L) 08/15/2020   HCT 37.5 08/15/2020   MCV  78.3 (L) 08/15/2020   PLT 333 08/15/2020   Lab  Results  Component Value Date   NA 138 08/15/2020   K 4.4 08/15/2020   CO2 23 08/15/2020   GLUCOSE 80 08/15/2020   BUN 10 08/15/2020   CREATININE 0.86 08/15/2020   BILITOT 0.5 08/15/2020   ALKPHOS 75 05/22/2020   AST 17 08/15/2020   ALT 14 08/15/2020   PROT 7.7 08/15/2020   ALBUMIN 3.5 05/22/2020   CALCIUM 9.3 08/15/2020   ANIONGAP 11 05/22/2020   No results found for: CHOL No results found for: HDL No results found for: LDLCALC No results found for: TRIG No results found for: CHOLHDL Lab Results  Component Value Date   HGBA1C 5.2 12/03/2018      Assessment & Plan:   Problem List Items Addressed This Visit      Musculoskeletal and Integument   Discoid lupus erythematosus of eyelid - Primary Stable on current regimen continue to follow-up with specialist as scheduled   Relevant Orders   Ambulatory referral to Dermatology    Other Visit Diagnoses    Iron deficiency anemia, unspecified iron deficiency anemia type Pending further evaluation with iron panel with possible change to iron only treatment versus oral contraceptive with iron; per patient's desire/request   Relevant Orders   Iron, TIBC and Ferritin Panel (Completed)      No orders of the defined types were placed in this encounter.   Follow-up: No follow-ups on file.    [name], NP

## 2020-08-24 LAB — IRON,TIBC AND FERRITIN PANEL
Ferritin: 8 ng/mL — ABNORMAL LOW (ref 15–150)
Iron Saturation: 6 % — CL (ref 15–55)
Iron: 27 ug/dL (ref 27–159)
Total Iron Binding Capacity: 443 ug/dL (ref 250–450)
UIBC: 416 ug/dL (ref 131–425)

## 2020-08-25 ENCOUNTER — Other Ambulatory Visit: Payer: Self-pay | Admitting: Nurse Practitioner

## 2020-08-25 MED ORDER — FERROUS FUMARATE 150 MG PO TABS: 1.0000 | ORAL_TABLET | Freq: Every day | ORAL | 3 refills | Status: DC

## 2020-08-30 DIAGNOSIS — G43909 Migraine, unspecified, not intractable, without status migrainosus: Secondary | ICD-10-CM | POA: Diagnosis not present

## 2020-08-30 DIAGNOSIS — R519 Headache, unspecified: Secondary | ICD-10-CM | POA: Diagnosis not present

## 2020-08-30 DIAGNOSIS — H539 Unspecified visual disturbance: Secondary | ICD-10-CM | POA: Diagnosis not present

## 2020-09-03 ENCOUNTER — Telehealth: Payer: Self-pay | Admitting: Neurology

## 2020-09-03 NOTE — Telephone Encounter (Signed)
Please let patient know MRI of the brain and MRA of the head were normal thank you.

## 2020-09-04 ENCOUNTER — Other Ambulatory Visit: Payer: Self-pay | Admitting: Obstetrics and Gynecology

## 2020-09-04 NOTE — Patient Instructions (Signed)
Hi Ms. Lope, sorry we missed you today  - as a part of your Medicaid benefit, you are eligible for care management and care coordination services at no cost or copay. I was unable to reach you by phone today but would be happy to help you with your health related needs. Please feel free to call me at [phone].  A member of the Managed Medicaid care management team will reach out to you again over the next 7 days.   Aida Raider RN, BSN [place]  [facility] - Managed Medicaid High Risk [phone].

## 2020-09-04 NOTE — Patient Outreach (Signed)
Care Coordination  09/04/2020  Rhonda Mcgee 1985/08/24 [id number]    Medicaid Managed Care   Unsuccessful Outreach Note  09/04/2020 Name: Rhonda Mcgee MRN: [id number] DOB: [birth year]  Referred by: [name], NP Reason for referral : High Risk Managed Medicaid (Unsuccessful telephone outreach)   A second unsuccessful telephone outreach was attempted today. The patient was referred to the case management team for assistance with care management and care coordination.   Follow Up Plan: A member of the Managed Medicaid  care management team will reach out to the patient again over the next 7 days.   Aida Raider RN, BSN [place]  [facility] - Managed Medicaid High Risk [phone]

## 2020-09-04 NOTE — Telephone Encounter (Addendum)
I called the patient.  Her voicemail was full.  If she calls back, please let her know that the MRI of the brain and the MRA of the head were both normal.

## 2020-09-05 NOTE — Telephone Encounter (Signed)
I tried to reach the patient again this morning. Her VM was full again. If she calls back, please give her the message below noted in bold.

## 2020-09-06 ENCOUNTER — Encounter: Payer: Self-pay | Admitting: *Deleted

## 2020-09-06 IMAGING — CR DG CHEST 2V
2 series · 2 of 2 positions shown · non-contrast
Comparison: 03/06/2018

CLINICAL DATA: Cough and fever. Lupus and rheumatoid arthritis.
Smoker.

EXAM:
CHEST - 2 VIEW

[chest pa]
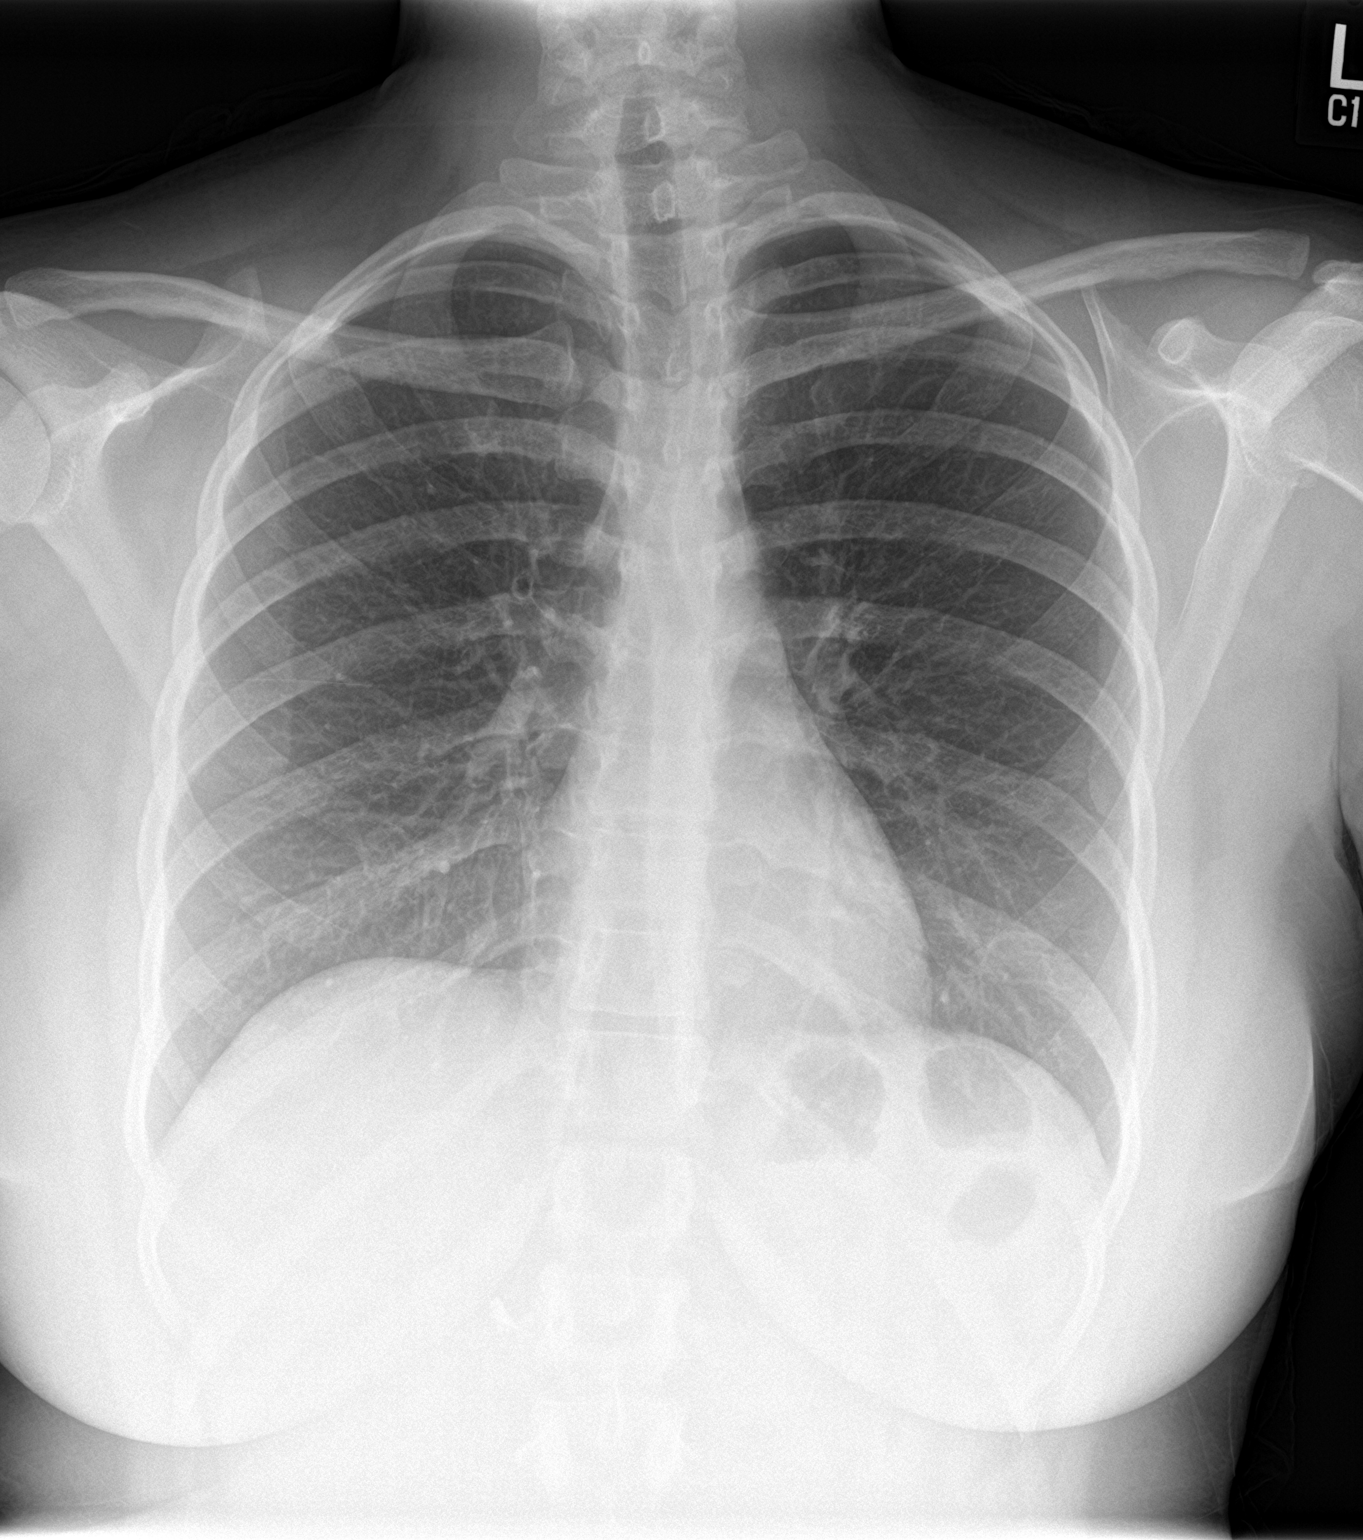

[chest lat]
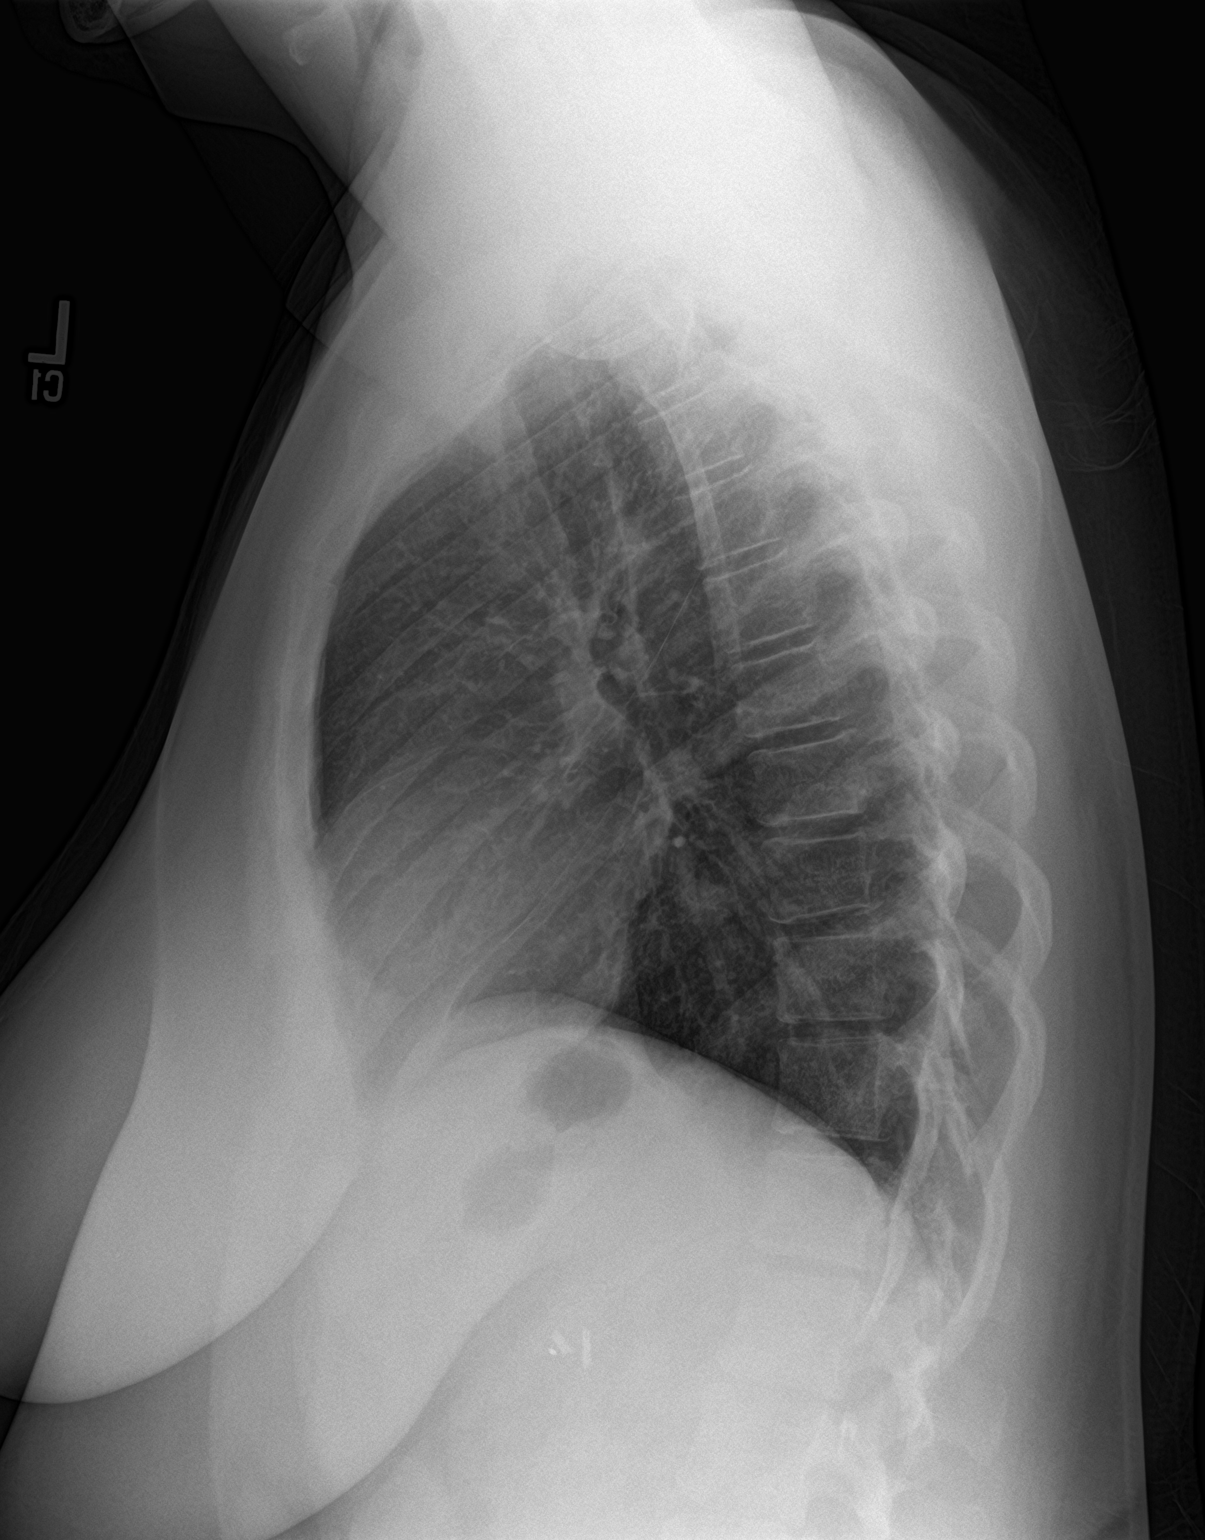

[2 of 2 positions shown; findings below may reference images not displayed]

FINDINGS: The heart size and mediastinal contours are within normal limits.
Both lungs are clear. The visualized skeletal structures are
unremarkable.
IMPRESSION: Negative.  No active cardiopulmonary disease.

## 2020-09-06 NOTE — Telephone Encounter (Signed)
I tried to reach the patient again this morning. Her VM was full again.  Letter mailed to pt and mychart message sent.

## 2020-09-07 ENCOUNTER — Other Ambulatory Visit: Payer: Self-pay

## 2020-09-07 ENCOUNTER — Emergency Department (HOSPITAL_COMMUNITY)
Admission: EM | Admit: 2020-09-07 | Discharge: 2020-09-08 | Discharge status: Against Medical Advice | Payer: Medicaid Other | Attending: Emergency Medicine | Admitting: Emergency Medicine

## 2020-09-07 DIAGNOSIS — N898 Other specified noninflammatory disorders of vagina: Secondary | ICD-10-CM

## 2020-09-07 DIAGNOSIS — Z5321 Procedure and treatment not carried out due to patient leaving prior to being seen by health care provider: Secondary | ICD-10-CM | POA: Insufficient documentation

## 2020-09-07 DIAGNOSIS — N760 Acute vaginitis: Secondary | ICD-10-CM | POA: Insufficient documentation

## 2020-09-07 DIAGNOSIS — F17210 Nicotine dependence, cigarettes, uncomplicated: Secondary | ICD-10-CM | POA: Diagnosis not present

## 2020-09-07 DIAGNOSIS — B9689 Other specified bacterial agents as the cause of diseases classified elsewhere: Secondary | ICD-10-CM | POA: Diagnosis not present

## 2020-09-07 NOTE — ED Provider Notes (Signed)
  Emergency Medicine Provider in Triage Note   MSE was initiated and I personally evaluated the patient  10:42 PM on Sep 07, 2020 as provider in triage.   Chief Complaint: vaginal irritation.   HPI  Patient is a 35 y.o. who presets to the ED with complaints of persistent vaginal irritation. Having white discharge that is pruritic and vaginal pain. Sexually active with 1 partner, no concern for STI per patient. Feels like prior BV.   Review of Systems  Positive: Vaginal irritation/discharge Negative: Nausea, vomiting, fevers   Physical Exam  BP (!) 124/91 (BP Location: Left Arm)   Pulse 100   Temp 98.3 F (36.8 C) (Oral)   Resp 16   LMP 08/17/2020   SpO2 100%    Gen:   Awake, no distress   HEENT:  Atraumatic  Resp:  Normal effort  Cardiac:  Normal rate  Abd:   Nondistended, nontender  MSK:   Moves extremities without difficulty  Neuro:  Speech clear   Medical Decision Making   Initiation of care has begun. The patient has been counseled on the process, plan, and necessity for staying for the completion/evaluation, informed that the remainder of the evaluation will be completed by another provider, this initial triage assessment does not replace that evaluation, and the importance of remaining in the ED until their evaluation is complete.  Clinical Impression  Vaginal irritation.        Leafy Kindle 09/07/20 2249    Varney Biles, MD 09/08/20 1541

## 2020-09-07 NOTE — ED Triage Notes (Signed)
Pt c/o vaginal itching. Pt states she was supposed to get flagyl, but prescription has not been sent. Pt has had clindamycin. Pt is having pain and green discharge.

## 2020-09-08 ENCOUNTER — Other Ambulatory Visit: Payer: Self-pay

## 2020-09-08 ENCOUNTER — Encounter (HOSPITAL_COMMUNITY): Payer: Self-pay

## 2020-09-08 ENCOUNTER — Emergency Department (HOSPITAL_COMMUNITY)
Admission: EM | Admit: 2020-09-08 | Discharge: 2020-09-08 | Disposition: A | Discharge status: Home / Self Care | Payer: Medicaid Other | Source: Home / Self Care | Attending: Emergency Medicine | Admitting: Emergency Medicine

## 2020-09-08 DIAGNOSIS — N760 Acute vaginitis: Secondary | ICD-10-CM

## 2020-09-08 DIAGNOSIS — B9689 Other specified bacterial agents as the cause of diseases classified elsewhere: Secondary | ICD-10-CM | POA: Insufficient documentation

## 2020-09-08 DIAGNOSIS — F17210 Nicotine dependence, cigarettes, uncomplicated: Secondary | ICD-10-CM | POA: Insufficient documentation

## 2020-09-08 DIAGNOSIS — N898 Other specified noninflammatory disorders of vagina: Secondary | ICD-10-CM

## 2020-09-08 MED ORDER — METRONIDAZOLE 500 MG PO TABS
500.0000 mg | ORAL_TABLET | Freq: Once | ORAL | Status: AC
  Administered 2020-09-08: 500 mg via ORAL
  Filled 2020-09-08: qty 1

## 2020-09-08 MED ORDER — METRONIDAZOLE 500 MG PO TABS: 500.0000 mg | ORAL_TABLET | Freq: Two times a day (BID) | ORAL | 0 refills | Status: DC

## 2020-09-08 NOTE — ED Triage Notes (Signed)
Pt to ED by POV from home with c/o vaginal itching. Pt has been seen at [facility] twice for the same thing, she has been taking clindamycin but that she was supposed to be taking flagyl, however the prescription has not been sent. Arrives A+O, VSS, NADN.

## 2020-09-08 NOTE — Discharge Instructions (Signed)
1. Medications: flagyl, usual home medications 2. Treatment: rest, drink plenty of fluids,  3. Follow Up: Please followup with your primary doctor in 3-5 days for discussion of your diagnoses and further evaluation after today's visit; if you do not have a primary care doctor use the resource guide provided to find one; Please return to the ER for worsening symptoms, fever, chills, abd pain, nausea or vomiting.

## 2020-09-08 NOTE — ED Provider Notes (Signed)
[place] DEPT Provider Note   CSN: [id number] Arrival date & time: 09/08/20  0000     History Chief Complaint  Patient presents with  . Vaginal Itching    Rhonda Mcgee is a 35 y.o. female.complaints of persistent vaginal irritation for the last month. She reports she was given clindamycin PO for a labial folicculitis, but was never prescribed flagyl. Pt reports she has a thin, milky malodorous discharge consistent with prior episodes of BV.  She has used monistat without relief. pt reports she has been scratching her labia as well.  No dysuria.  No abdomina pain, no N/V/D, no fevers.  Sexually active with 1 partner, no concern for STI per patient.   Pt is establishing with OB/GYN in [place].    The history is provided by the patient and medical records. No language interpreter was used.       Past Medical History:  Diagnosis Date  . Chlamydia   . GERD (gastroesophageal reflux disease)   . Gonorrhea   . Herpes genitalia   . Lupus ([place])   . Rheumatoid arthritis(714.0)     Patient Active Problem List   Diagnosis Date Noted  . High risk medication use 08/15/2020  . Chronic migraine without aura, with intractable migraine, so stated, with status migrainosus 08/14/2020  . 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 Abdominal pain, epigastric 12/04/2018  . GERD (gastroesophageal reflux disease) 11/10/2018  . Gastritis 11/10/2018  . NSAID induced gastritis 11/10/2018  . Intractable nausea and vomiting 11/09/2018  . Lupus ([place])   . Hypokalemia   . Vomiting   . 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 Genital herpes 11/02/2013  . 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 Visual disturbances 12/06/2010    Past Surgical History:  Procedure Laterality Date  . ADENOIDECTOMY    . CHOLECYSTECTOMY    . DILATION AND CURETTAGE OF UTERUS     elective ab  . GALLBLADDER SURGERY    . TONSILLECTOMY    . TUBAL  LIGATION       OB History    Gravida  4   Para      Term      Preterm      AB  2   Living  2     SAB  1   IAB  1   Ectopic      Multiple      Live Births  2        Obstetric Comments  SVD x 2. SAB x 1. EAB x 1        Family History  Adopted: Yes  Problem Relation Age of Onset  . Diabetes Mother   . Hypertension Mother   . Diabetes Sister   . Hypertension Sister   . Migraines Sister        with menstrual cycle, had vision loss with migraines  . Lupus Maternal Grandmother   . Esophageal cancer Neg Hx   . Rectal cancer Neg Hx   . Stomach cancer Neg Hx   . Colon cancer Neg Hx     Social History   Tobacco Use  . Smoking status: Current Some Day Smoker    Types: Cigarettes  . Smokeless tobacco: Never Used  . Tobacco comment: trying to quit, no smoking for last 2 weeks  Vaping Use  . Vaping Use: Never used  Substance Use Topics  . Alcohol use: Yes    Comment: occasional  . Drug  use: No    Home Medications Prior to Admission medications   Medication Sig Start Date End Date Taking? Authorizing Provider  metroNIDAZOLE (FLAGYL) 500 MG tablet Take 1 tablet (500 mg total) by mouth 2 (two) times daily. 09/08/20  Yes Jowanda Heeg, Jarrett Soho, PA-C  azithromycin (AZASITE) 1 % ophthalmic solution Apply to eye. 05/24/20   [provider]  chlorhexidine (PERIDEX) 0.12 % solution SMARTSIG:By Mouth 08/18/20   [provider]  Ferrous Fumarate 150 MG TABS Take 1 tablet (150 mg total) by mouth daily. 08/25/20 08/25/21  [name], NP  hydroxychloroquine (PLAQUENIL) 200 MG tablet Take 1 tablet (200 mg total) by mouth daily. 08/15/20   Rice, Resa Miner, MD  ketoconazole (NIZORAL) 2 % shampoo Apply topically daily. 06/07/20   [provider]  LO LOESTRIN FE 1 MG-10 MCG / 10 MCG tablet Take 1 tablet by mouth daily. 06/13/20   Lajean Manes, CNM  nortriptyline (PAMELOR) 25 MG capsule Start with 25mg  at bedtime and in 2 weeks increase to 50mg   at bedtime 08/14/20   Melvenia Beam, MD  ondansetron (ZOFRAN-ODT) 4 MG disintegrating tablet Take 1-2 tablets (4-8 mg total) by mouth every 8 (eight) hours as needed. 08/14/20   Melvenia Beam, MD  pantoprazole (PROTONIX) 40 MG tablet Take by mouth. 03/04/20   [provider]    Allergies    Cephalexin and Keflex [cephalexin]  Review of Systems   Review of Systems  Constitutional: Negative for appetite change, diaphoresis, fatigue, fever and unexpected weight change.  HENT: Negative for mouth sores.   Eyes: Negative for visual disturbance.  Respiratory: Negative for cough, chest tightness, shortness of breath and wheezing.   Cardiovascular: Negative for chest pain.  Gastrointestinal: Negative for abdominal pain, constipation, diarrhea, nausea and vomiting.  Endocrine: Negative for polydipsia, polyphagia and polyuria.  Genitourinary: Positive for vaginal discharge. Negative for dysuria, frequency, hematuria and urgency.  Musculoskeletal: Negative for back pain and neck stiffness.  Skin: Negative for rash.  Allergic/Immunologic: Negative for immunocompromised state.  Neurological: Negative for syncope, light-headedness and headaches.  Hematological: Does not bruise/bleed easily.  Psychiatric/Behavioral: Negative for sleep disturbance. The patient is not nervous/anxious.     Physical Exam Updated Vital Signs BP (!) 136/91   Pulse 83   Temp 98.3 F (36.8 C)   Resp 16   Ht 5\' 5"  (1.651 m)   Wt 93 kg   LMP 08/17/2020   SpO2 100%   BMI 34.11 kg/m   Physical Exam Vitals and nursing note reviewed.  Constitutional:      General: She is not in acute distress.    Appearance: She is well-developed.  HENT:     Head: Normocephalic.  Eyes:     General: No scleral icterus.    Conjunctiva/sclera: Conjunctivae normal.  Cardiovascular:     Rate and Rhythm: Normal rate.  Pulmonary:     Effort: Pulmonary effort is normal.  Abdominal:     General: There is no distension.      Palpations: Abdomen is soft.     Tenderness: There is no abdominal tenderness.  Genitourinary:    Comments: Pt declines exam Musculoskeletal:        General: Normal range of motion.     Cervical back: Normal range of motion.  Skin:    General: Skin is warm and dry.  Neurological:     Mental Status: She is alert.     ED Results / Procedures / Treatments   Labs (all labs ordered  are listed, but only abnormal results are displayed) Labs Reviewed - No data to display  EKG None  Radiology No results found.  Procedures Procedures   Medications Ordered in ED Medications  metroNIDAZOLE (FLAGYL) tablet 500 mg (has no administration in time range)    ED Course  I have reviewed the triage vital signs and the nursing notes.  Pertinent labs & imaging results that were available during my care of the patient were reviewed by me and considered in my medical decision making (see chart for details).    MDM Rules/Calculators/A&P                           Patient presents with persistent vaginal itching for the last month.  She has had multiple work-ups for this including wet prep that was positive for BV.  She reports this feels like previous BV.  She does not wish to have another pelvic exam today but wishes to have a prescription for Flagyl.  She reports that she was supposed to be prescribed this medication several times in the past but has never shown up at her pharmacy.  She has a primary care and is establishing with an OB/GYN this week.  She denies abdominal pain, nausea, vomiting, diarrhea, dysuria, hematuria.  Denies risk for STD.  Wishes only for prescription today.  Discussed reasons to return immediately to the emergency department.   Final Clinical Impression(s) / ED Diagnoses Final diagnoses:  BV (bacterial vaginosis)  Vaginal itching    Rx / DC Orders ED Discharge Orders         Ordered    metroNIDAZOLE (FLAGYL) 500 MG tablet  2 times daily        09/08/20 0308            Morrison Mcbryar, Jarrett Soho, PA-C 09/08/20 7867    Ripley Fraise, MD 09/08/20 [phone]

## 2020-09-11 ENCOUNTER — Telehealth: Payer: Self-pay

## 2020-09-11 NOTE — Telephone Encounter (Signed)
Transition Care Management Follow-up Telephone Call  Date of discharge and from where: 09/08/2020 from [place]  How have you been since you were released from the hospital? Pt stated that she is still feeling poorly. Pt states that her symptoms are dark urine, back pain, nausea and fatigue. Pt stated that she was going to go back to the ED. I did suggest to reach out to her PCP to see if they have any opening today. Pt agreed and will reach out for an appt.   Any questions or concerns? No  Items Reviewed:  Did the pt receive and understand the discharge instructions provided? Yes   Medications obtained and verified? Yes   Other? No   Any new allergies since your discharge? No   Dietary orders reviewed? n/a  Do you have support at home? No   Functional Questionnaire: (I = Independent and D = Dependent) ADLs: I  Bathing/Dressing- I  Meal Prep- I  Eating- I  Maintaining continence- I  Transferring/Ambulation- I  Managing Meds- I   Follow up appointments reviewed:   PCP Hospital f/u appt confirmed? No    Specialist Hospital f/u appt confirmed? No    Are transportation arrangements needed? No   If their condition worsens, is the pt aware to call PCP or go to the Emergency Dept.? Yes  Was the patient provided with contact information for the PCP's office or ED? Yes  Was to pt encouraged to call back with questions or concerns? Yes

## 2020-10-10 ENCOUNTER — Ambulatory Visit: Payer: BC Managed Care – PPO | Admitting: Internal Medicine

## 2020-10-10 NOTE — Progress Notes (Deleted)
Office Visit Note  Patient: Rhonda Mcgee             Date of Birth: [birth year]           MRN: [id number]             PCP: [name], NP Referring: [name], NP Visit Date: 10/10/2020   Subjective:  No chief complaint on file.   History of Present Illness: Rhonda Mcgee is a 35 y.o. female here for follow up ***     No Rheumatology ROS completed.   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   Genital herpes 11/02/2013   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   Visual disturbances 12/06/2010    Past Medical History:  Diagnosis Date   Chlamydia    GERD (gastroesophageal reflux disease)    Gonorrhea    Herpes genitalia    Lupus ([place])    Rheumatoid arthritis(714.0)     Family History  Adopted: Yes  Problem Relation Age of Onset   Diabetes Mother    Hypertension Mother    Diabetes Sister    Hypertension Sister    Migraines Sister        with menstrual cycle, had vision loss with migraines   Lupus Maternal Grandmother    Esophageal cancer Neg Hx    Rectal cancer Neg Hx    Stomach cancer Neg Hx    Colon cancer Neg Hx    Past Surgical History:  Procedure Laterality Date   ADENOIDECTOMY     CHOLECYSTECTOMY     DILATION AND CURETTAGE OF UTERUS     elective ab   [place]     Social History   Social History Narrative   Lives at home with her children   Right handed    Caffeine: 32 oz sweet tea/day   Immunization History  Administered Date(s) Administered   PFIZER(Purple Top)SARS-COV-2 Vaccination 07/22/2019, 08/17/2019   PPD Test 06/24/2014, 07/12/2014     Objective: Vital Signs: There were no vitals taken for this visit.   Physical Exam   Musculoskeletal Exam:   CDAI Exam: CDAI Score: -- Patient Global: --; Provider Global: -- Swollen: --; Tender: -- Joint Exam 10/10/2020   No joint exam has been documented for this visit   There is currently no information documented on the homunculus. Go to the Rheumatology activity and complete the homunculus joint exam.  Investigation: No additional findings.  Imaging: No results found.  Recent Labs: Lab Results  Component Value Date   WBC 4.1 08/15/2020   HGB 11.5 (L) 08/15/2020   PLT 333 08/15/2020   NA 138 08/15/2020   K 4.4 08/15/2020   CL 107 08/15/2020   CO2 23 08/15/2020   GLUCOSE 80 08/15/2020   BUN 10 08/15/2020   CREATININE 0.86 08/15/2020   BILITOT 0.5 08/15/2020   ALKPHOS 75 05/22/2020   AST 17 08/15/2020   ALT 14 08/15/2020  PROT 7.7 08/15/2020   ALBUMIN 3.5 05/22/2020   CALCIUM 9.3 08/15/2020   GFRAA 103 08/15/2020    Speciality Comments: No specialty comments available.  Procedures:  No procedures performed Allergies: Cephalexin and Keflex [cephalexin]   Assessment / Plan:     Visit Diagnoses: No diagnosis found.  ***  Orders: No orders of the defined types were placed in this encounter.  No orders of the defined types were placed in this encounter.    Follow-Up Instructions: No follow-ups on file.   Collier Salina, MD  Note - This record has been created using [employer].  Chart creation errors have been sought, but may not always  have been located. Such creation errors do not reflect on  the standard of medical care.

## 2020-10-19 ENCOUNTER — Ambulatory Visit: Payer: BC Managed Care – PPO | Admitting: Internal Medicine

## 2020-10-19 NOTE — Progress Notes (Deleted)
Office Visit Note  Patient: Rhonda Mcgee             Date of Birth: 03-09-1986           MRN: [id number]             PCP: [name], NP Referring: [name], NP Visit Date: 10/19/2020   Subjective:  No chief complaint on file.   History of Present Illness: Rhonda Mcgee is a 35 y.o. female here for follow up for discoid lupus after restarting hydroxychloroquine 200 mg daily.***     No Rheumatology ROS completed.   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   Genital herpes 11/02/2013   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   Visual disturbances 12/06/2010    Past Medical History:  Diagnosis Date   Chlamydia    GERD (gastroesophageal reflux disease)    Gonorrhea    Herpes genitalia    Lupus ([place])    Rheumatoid arthritis(714.0)     Family History  Adopted: Yes  Problem Relation Age of Onset   Diabetes Mother    Hypertension Mother    Diabetes Sister    Hypertension Sister    Migraines Sister        with menstrual cycle, had vision loss with migraines   Lupus Maternal Grandmother    Esophageal cancer Neg Hx    Rectal cancer Neg Hx    Stomach cancer Neg Hx    Colon cancer Neg Hx    Past Surgical History:  Procedure Laterality Date   ADENOIDECTOMY     CHOLECYSTECTOMY     DILATION AND CURETTAGE OF UTERUS     elective ab   [place]     Social History   Social History  Narrative   Lives at home with her children   Right handed   Caffeine: 32 oz sweet tea/day   Immunization History  Administered Date(s) Administered   PFIZER(Purple Top)SARS-COV-2 Vaccination 07/22/2019, 08/17/2019   PPD Test 06/24/2014, 07/12/2014     Objective: Vital Signs: There were no vitals taken for this visit.   Physical Exam   Musculoskeletal Exam:   CDAI Exam: CDAI Score: -- Patient Global: --; Provider Global: -- Swollen: --; Tender: -- Joint Exam 10/19/2020   No joint exam has been documented for this visit   There is currently no information documented on the homunculus. Go to the Rheumatology activity and complete the homunculus joint exam.  Investigation: No additional findings.  Imaging: No results found.  Recent Labs: Lab Results  Component Value Date   WBC 4.1 08/15/2020   HGB 11.5 (L) 08/15/2020   PLT 333 08/15/2020   NA 138 08/15/2020   K 4.4 08/15/2020   CL 107 08/15/2020   CO2 23 08/15/2020   GLUCOSE 80 08/15/2020   BUN 10 08/15/2020   CREATININE 0.86 08/15/2020   BILITOT 0.5 08/15/2020   ALKPHOS 75 05/22/2020  AST 17 08/15/2020   ALT 14 08/15/2020   PROT 7.7 08/15/2020   ALBUMIN 3.5 05/22/2020   CALCIUM 9.3 08/15/2020   GFRAA 103 08/15/2020    Speciality Comments: No specialty comments available.  Procedures:  No procedures performed Allergies: Cephalexin and Keflex [cephalexin]   Assessment / Plan:     Visit Diagnoses: Discoid lupus erythematosus of eyelid, unspecified laterality  High risk medication use  ***  Orders: No orders of the defined types were placed in this encounter.  No orders of the defined types were placed in this encounter.    Follow-Up Instructions: No follow-ups on file.   Collier Salina, MD  Note - This record has been created using [employer].  Chart creation errors have been sought, but may not always  have been located. Such creation errors do not reflect on  the standard of  medical care.

## 2020-10-23 ENCOUNTER — Telehealth (INDEPENDENT_AMBULATORY_CARE_PROVIDER_SITE_OTHER): Payer: BC Managed Care – PPO | Admitting: Neurology

## 2020-10-23 DIAGNOSIS — Z5329 Procedure and treatment not carried out because of patient's decision for other reasons: Secondary | ICD-10-CM

## 2020-10-23 NOTE — Progress Notes (Signed)
   Complete physical exam  Patient: Rhonda Mcgee   DOB: 02/16/1998   35 y.o. Female  MRN: [id number]  Subjective:    No chief complaint on file.   Rhonda Mcgee is a 35 y.o. female who presents today for a complete physical exam. She reports consuming a {diet types:17450} diet. {types:19826} She generally feels {DESC; WELL/FAIRLY WELL/POORLY:18703}. She reports sleeping {DESC; WELL/FAIRLY WELL/POORLY:18703}. She {does/does not:200015} have additional problems to discuss today.    Most recent fall risk assessment:    10/24/2021   10:42 AM  Fall Risk   Falls in the past year? 0  Number falls in past yr: 0  Injury with Fall? 0  Risk for fall due to : No Fall Risks  Follow up Falls evaluation completed     Most recent depression screenings:    10/24/2021   10:42 AM 09/14/2020   10:46 AM  PHQ 2/9 Scores  PHQ - 2 Score 0 0  PHQ- 9 Score 5     {VISON DENTAL STD PSA (Optional):27386}  {History (Optional):23778}  Patient Care Team: Jessup, Joy, NP as PCP - General (Nurse Practitioner)   Outpatient Medications Prior to Visit  Medication Sig   fluticasone (FLONASE) 50 MCG/ACT nasal spray Place 2 sprays into both nostrils in the morning and at bedtime. After 7 days, reduce to once daily.   norgestimate-ethinyl estradiol (SPRINTEC 28) 0.25-35 MG-MCG tablet Take 1 tablet by mouth daily.   Nystatin POWD Apply liberally to affected area 2 times per day   spironolactone (ALDACTONE) 100 MG tablet Take 1 tablet (100 mg total) by mouth daily.   No facility-administered medications prior to visit.    ROS        Objective:     There were no vitals taken for this visit. {Vitals History (Optional):23777}  Physical Exam   No results found for any visits on 11/29/21. {Show previous labs (optional):23779}    Assessment & Plan:    Routine Health Maintenance and Physical Exam  Immunization History  Administered Date(s) Administered   DTaP 05/02/1999, 06/28/1999,  09/06/1999, 05/22/2000, 12/06/2003   Hepatitis A 10/02/2007, 10/07/2008   Hepatitis B 02/17/1999, 03/27/1999, 09/06/1999   HiB (PRP-OMP) 05/02/1999, 06/28/1999, 09/06/1999, 05/22/2000   IPV 05/02/1999, 06/28/1999, 02/25/2000, 12/06/2003   Influenza,inj,Quad PF,6+ Mos 01/07/2014   Influenza-Unspecified 04/08/2012   MMR 02/24/2001, 12/06/2003   Meningococcal Polysaccharide 10/07/2011   Pneumococcal Conjugate-13 05/22/2000   Pneumococcal-Unspecified 09/06/1999, 11/20/1999   Tdap 10/07/2011   Varicella 02/25/2000, 10/02/2007    Health Maintenance  Topic Date Due   HIV Screening  Never done   Hepatitis C Screening  Never done   INFLUENZA VACCINE  11/27/2021   PAP-Cervical Cytology Screening  11/29/2021 (Originally 02/16/2020)   PAP SMEAR-Modifier  11/29/2021 (Originally 02/16/2020)   TETANUS/TDAP  11/29/2021 (Originally 10/06/2021)   HPV VACCINES  Discontinued   COVID-19 Vaccine  Discontinued    Discussed health benefits of physical activity, and encouraged her to engage in regular exercise appropriate for her age and condition.  Problem List Items Addressed This Visit   None Visit Diagnoses     Annual physical exam    -  Primary   Cervical cancer screening       Need for Tdap vaccination          No follow-ups on file.     Joy Jessup, NP   

## 2020-11-07 ENCOUNTER — Ambulatory Visit: Payer: BC Managed Care – PPO | Admitting: Internal Medicine

## 2020-11-07 NOTE — Progress Notes (Deleted)
Office Visit Note  Patient: Rhonda Mcgee             Date of Birth: 09-Jul-1985           MRN: [id number]             PCP: Rhonda Francois, NP Referring: Rhonda Francois, NP Visit Date: 11/07/2020   Subjective:  No chief complaint on file.   History of Present Illness: Rhonda Mcgee is a 34 y.o. 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 female here for evaluation of possible systemic lupus. 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  She was previously seen with rheumatology with [facility] symptoms treated on hydroxychloroquine she describes visual changes on 400 mg dose although was able to resume 200 mg daily dose apparently without return of visual problems.  Reported disease manifestations at that time included photosensitive rashes, mucosal ulcers, and sicca syndrome.  Currently she is having some arthralgias also a significant amount of dry eye symptoms. The biggest complaint is the continued skin disease especially with involvement of her lower eyelids bilaterally and central face.  She is seeing ophthalmology and has had discussion of Acthar injections for the ongoing symptoms.  She does continue to smoke cigarettes.   Labs reviewed Urinalysis 08/06/20 negative for protein or blood   Imaging reviewed CXR 01/2020 negative for airspace disease   No Rheumatology ROS completed.   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 06/04/2019   Abdominal pain, epigastric 12/04/2018   GERD (gastroesophageal reflux disease)  11/10/2018   Gastritis 11/10/2018   NSAID induced gastritis 11/10/2018   Intractable nausea and vomiting 11/09/2018   Lupus (HCC)    Hypokalemia    Vomiting    Discoid lupus erythematosus of eyelid 03/16/2018   Genital herpes 11/02/2013   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   Visual disturbances 12/06/2010    Past Medical History:  Diagnosis Date   Chlamydia    GERD (gastroesophageal reflux disease)    Gonorrhea    Herpes genitalia    Lupus ([place])    Rheumatoid arthritis(714.0)     Family History  Adopted: Yes  Problem Relation Age of Onset   Diabetes Mother    Hypertension Mother    Diabetes Sister    Hypertension Sister    Migraines Sister        with menstrual cycle, had vision loss with migraines   Lupus Maternal Grandmother    Esophageal cancer Neg Hx    Rectal cancer Neg Hx    Stomach cancer Neg Hx    Colon cancer Neg Hx    Past Surgical History:  Procedure Laterality Date   ADENOIDECTOMY     CHOLECYSTECTOMY     DILATION AND CURETTAGE OF UTERUS     elective ab   GALLBLADDER SURGERY     TONSILLECTOMY     TUBAL LIGATION     Social History   Social History Narrative   Lives at home with her children   Right handed   Caffeine: 32 oz sweet tea/day   Immunization History  Administered  Date(s) Administered   PFIZER(Purple Top)SARS-COV-2 Vaccination 07/22/2019, 08/17/2019   PPD Test 06/24/2014, 07/12/2014     Objective: Vital Signs: There were no vitals taken for this visit.   Physical Exam   Musculoskeletal Exam: ***  CDAI Exam: CDAI Score: -- Patient Global: --; Provider Global: -- Swollen: --; Tender: -- Joint Exam 11/07/2020   No joint exam has been documented for this visit   There is currently no information documented on the homunculus. Go to the Rheumatology activity and complete the homunculus joint exam.  Investigation: No additional findings.  Imaging: No results found.  Recent Labs: Lab Results  Component Value Date   WBC 4.1 08/15/2020    HGB 11.5 (L) 08/15/2020   PLT 333 08/15/2020   NA 138 08/15/2020   K 4.4 08/15/2020   CL 107 08/15/2020   CO2 23 08/15/2020   GLUCOSE 80 08/15/2020   BUN 10 08/15/2020   CREATININE 0.86 08/15/2020   BILITOT 0.5 08/15/2020   ALKPHOS 75 05/22/2020   AST 17 08/15/2020   ALT 14 08/15/2020   PROT 7.7 08/15/2020   ALBUMIN 3.5 05/22/2020   CALCIUM 9.3 08/15/2020   GFRAA 103 08/15/2020    Speciality Comments: No specialty comments available.  Procedures:  No procedures performed Allergies: Cephalexin and Keflex [cephalexin]   Assessment / Plan:     Visit Diagnoses: No diagnosis found.  ***  Orders: No orders of the defined types were placed in this encounter.  No orders of the defined types were placed in this encounter.    Follow-Up Instructions: No follow-ups on file.   Collier Salina, MD  Note - This record has been created using [employer].  Chart creation errors have been sought, but may not always  have been located. Such creation errors do not reflect on  the standard of medical care.

## 2020-11-21 DIAGNOSIS — Z3202 Encounter for pregnancy test, result negative: Secondary | ICD-10-CM | POA: Diagnosis not present

## 2020-11-21 DIAGNOSIS — R001 Bradycardia, unspecified: Secondary | ICD-10-CM | POA: Diagnosis not present

## 2020-11-21 DIAGNOSIS — Z9049 Acquired absence of other specified parts of digestive tract: Secondary | ICD-10-CM | POA: Diagnosis not present

## 2020-11-21 DIAGNOSIS — R42 Dizziness and giddiness: Secondary | ICD-10-CM | POA: Diagnosis not present

## 2020-11-21 DIAGNOSIS — R519 Headache, unspecified: Secondary | ICD-10-CM | POA: Diagnosis not present

## 2020-11-21 DIAGNOSIS — F17210 Nicotine dependence, cigarettes, uncomplicated: Secondary | ICD-10-CM | POA: Diagnosis not present

## 2020-11-21 DIAGNOSIS — U071 COVID-19: Secondary | ICD-10-CM | POA: Diagnosis not present

## 2020-11-21 DIAGNOSIS — A084 Viral intestinal infection, unspecified: Secondary | ICD-10-CM | POA: Diagnosis not present

## 2020-11-21 DIAGNOSIS — Z20822 Contact with and (suspected) exposure to covid-19: Secondary | ICD-10-CM | POA: Diagnosis not present

## 2020-11-21 DIAGNOSIS — G43809 Other migraine, not intractable, without status migrainosus: Secondary | ICD-10-CM | POA: Diagnosis not present

## 2020-11-21 DIAGNOSIS — R109 Unspecified abdominal pain: Secondary | ICD-10-CM | POA: Diagnosis not present

## 2020-11-22 ENCOUNTER — Other Ambulatory Visit: Payer: Self-pay | Admitting: Nurse Practitioner

## 2020-11-22 ENCOUNTER — Telehealth: Payer: Self-pay

## 2020-11-22 ENCOUNTER — Encounter: Payer: Self-pay | Admitting: Nurse Practitioner

## 2020-11-22 ENCOUNTER — Other Ambulatory Visit: Payer: Self-pay

## 2020-11-22 ENCOUNTER — Ambulatory Visit (INDEPENDENT_AMBULATORY_CARE_PROVIDER_SITE_OTHER): Payer: Medicaid Other | Admitting: Nurse Practitioner

## 2020-11-22 VITALS — BP 115/67 | HR 87 | Temp 98.4°F | Ht 65.5 in | Wt 215.1 lb

## 2020-11-22 DIAGNOSIS — R5383 Other fatigue: Secondary | ICD-10-CM | POA: Diagnosis not present

## 2020-11-22 DIAGNOSIS — D509 Iron deficiency anemia, unspecified: Secondary | ICD-10-CM

## 2020-11-22 DIAGNOSIS — Z1329 Encounter for screening for other suspected endocrine disorder: Secondary | ICD-10-CM | POA: Diagnosis not present

## 2020-11-22 DIAGNOSIS — R001 Bradycardia, unspecified: Secondary | ICD-10-CM | POA: Diagnosis not present

## 2020-11-22 DIAGNOSIS — R059 Cough, unspecified: Secondary | ICD-10-CM

## 2020-11-22 DIAGNOSIS — J069 Acute upper respiratory infection, unspecified: Secondary | ICD-10-CM

## 2020-11-22 MED ORDER — AZITHROMYCIN 250 MG PO TABS: ORAL_TABLET | ORAL | 0 refills | Status: AC

## 2020-11-22 MED ORDER — NICOTINE 21 MG/24HR TD PT24: 21.0000 mg | MEDICATED_PATCH | Freq: Every day | TRANSDERMAL | 2 refills | Status: DC

## 2020-11-22 MED ORDER — ALBUTEROL SULFATE HFA 108 (90 BASE) MCG/ACT IN AERS: 1.0000 | INHALATION_SPRAY | Freq: Four times a day (QID) | RESPIRATORY_TRACT | 1 refills | Status: DC | PRN

## 2020-11-22 MED ORDER — BENZONATATE 100 MG PO CAPS: 100.0000 mg | ORAL_CAPSULE | Freq: Three times a day (TID) | ORAL | 0 refills | Status: DC | PRN

## 2020-11-22 MED ORDER — AZITHROMYCIN 1 % OP SOLN: 1.0000 [drp] | Freq: Two times a day (BID) | OPHTHALMIC | 0 refills | Status: AC

## 2020-11-22 NOTE — Patient Instructions (Addendum)
Iron Deficiency Anemia, Adult Iron deficiency anemia is when you do not have enough red blood cells or hemoglobin in your blood. This happens because you have too little iron in your body. Hemoglobin carries oxygen to parts of the body. Anemia can cause yourbody to not get enough oxygen. What are the causes? Not eating enough foods that have iron in them. The body not being able to take in iron well. Needing more iron due to pregnancy or heavy menstrual periods, for females. Cancer. Bleeding in the bowels. Many blood draws. What increases the risk? Being pregnant. Being a teenage girl going through a growth spurt. What are the signs or symptoms? Pale skin, lips, and nails. Weakness, dizziness, and getting tired easily. Headache. Feeling like you cannot breathe well when moving (shortness of breath). Cold hands and feet. Fast heartbeat or a heartbeat that is not regular. Feeling grouchy (irritable) or breathing fast. These are more common in very bad anemia. Mild anemia may not cause any symptoms. How is this treated? This condition is treated by finding out why you do not have enough iron and then getting more iron. It may include: Adding foods to your diet that have a lot of iron. Taking iron pills (supplements). If you are pregnant or breastfeeding, you may need to take extra iron. Your diet often does not provide the amount of iron that you need. Getting more vitamin C in your diet. Vitamin C helps your body take in iron. You may need to take iron pills with a glass of orange juice or vitamin C pills. Medicines to make heavy menstrual periods lighter. Surgery. You may need blood tests to see if treatment is working. If the treatment doesnot seem to be working, you may need more tests. Follow these instructions at home: Medicines Take over-the-counter and prescription medicines only as told by your doctor. This includes iron pills and vitamins. Take iron pills when your stomach is  empty. If you cannot handle this, take them with food. Do not drink milk or take antacids at the same time as your iron pills. Iron pills may turn your poop (stool)black. If you cannot handle taking iron pills by mouth, ask your doctor about getting iron through: An IV tube. A shot (injection) into a muscle. Eating and drinking  Talk with your doctor before changing the foods you eat. He or she may tell you to eat foods that have a lot of iron, such as: Liver. Low-fat (lean) beef. Breads and cereals that have iron added to them. Eggs. Dried fruit. Dark green, leafy vegetables. Eat fresh fruits and vegetables that are high in vitamin C. They help your body use iron. Foods with a lot of vitamin C include: Oranges. Peppers. Tomatoes. Mangoes. Drink enough fluid to keep your pee (urine) pale yellow.  Managing constipation If you are taking iron pills, they may cause trouble pooping (constipation). To prevent or treat trouble pooping, you may need to: Take over-the-counter or prescription medicines. Eat foods that are high in fiber. These include beans, whole grains, and fresh fruits and vegetables. Limit foods that are high in fat and sugar. These include fried or sweet foods. General instructions Return to your normal activities as told by your doctor. Ask your doctor what activities are safe for you. Keep yourself clean, and keep things clean around you. Keep all follow-up visits as told by your doctor. This is important. Contact a doctor if: You feel like you may vomit (nauseous), or you vomit. You feel  weak. You are sweating for no reason. You have trouble pooping, such as: Pooping less than 3 times a week. Straining to poop. Having poop that is hard, dry, or larger than normal. Feeling full or bloated. Pain in the lower belly. Not feeling better after pooping. Get help right away if: You pass out (faint). You have chest pain. You have trouble breathing that: Is very  bad. Gets worse with physical activity. You have a fast heartbeat, or a heartbeat that does not feel regular. You get light-headed when getting up from sitting or lying down. These symptoms may be an emergency. Do not wait to see if the symptoms will go away. Get medical help right away. Call your local emergency services (911 in the [place] Do not drive yourself to the hospital. Summary Iron deficiency anemia is when you have too little iron in your body. This condition is treated by finding out why you do not have enough iron in your body and then getting more iron. Take over-the-counter and prescription medicines only as told by your doctor. Eat fresh fruits and vegetables that are high in vitamin C. Get help right away if you cannot breathe well. This information is not intended to replace advice given to you by your health care provider. Make sure you discuss any questions you have with your healthcare provider. Document Revised: 12/22/2018 Document Reviewed: 12/22/2018 Elsevier Patient Education  [place]. Fatigue If you have fatigue, you feel tired all the time and have a lack of energy or a lack of motivation. Fatigue may make it difficult to start or complete tasks because of exhaustion. In general, occasional or mild fatigue is often a normal response to activity or life. However, long-lasting (chronic) or extreme fatigue may be a symptom of a medical condition. Follow these instructions at home: General instructions Watch your fatigue for any changes. Go to bed and get up at the same time every day. Avoid fatigue by pacing yourself during the day and getting enough sleep at night. Maintain a healthy weight. Medicines Take over-the-counter and prescription medicines only as told by your health care provider. Take a multivitamin, if told by your health care provider.  Do not use herbal or dietary supplements unless they are approved by your health care  provider. Activity  Exercise regularly, as told by your health care provider. Use or practice techniques to help you relax, such as yoga, tai chi, meditation, or massage therapy.  Eating and drinking  Avoid heavy meals in the evening. Eat a well-balanced diet, which includes lean proteins, whole grains, plenty of fruits and vegetables, and low-fat dairy products. Avoid consuming too much caffeine. Avoid the use of alcohol. Drink enough fluid to keep your urine pale yellow.  Lifestyle Change situations that cause you stress. Try to keep your work and personal schedule in balance. Do not use any products that contain nicotine or tobacco, such as cigarettes and e-cigarettes. If you need help quitting, ask your health care provider. Do not use drugs. Contact a health care provider if: Your fatigue does not get better. You have a fever. You suddenly lose or gain weight. You have headaches. You have trouble falling asleep or sleeping through the night. You feel angry, guilty, anxious, or sad. You are unable to have a bowel movement (constipation). Your skin is dry. You have swelling in your legs or another part of your body. Get help right away if: You feel confused. Your vision is blurry. You feel faint or  you pass out. You have a severe headache. You have severe pain in your abdomen, your back, or the area between your waist and hips (pelvis). You have chest pain, shortness of breath, or an irregular or fast heartbeat. You are unable to urinate, or you urinate less than normal. You have abnormal bleeding, such as bleeding from the rectum, vagina, nose, lungs, or nipples. You vomit blood. You have thoughts about hurting yourself or others. If you ever feel like you may hurt yourself or others, or have thoughts about taking your own life, get help right away. You can go to your nearest emergency department or call: Your local emergency services (911 in the [place] A suicide crisis  helpline, such as the [place] at (984)326-4991. This is open 24 hours a day. Summary If you have fatigue, you feel tired all the time and have a lack of energy or a lack of motivation. Fatigue may make it difficult to start or complete tasks because of exhaustion. Long-lasting (chronic) or extreme fatigue may be a symptom of a medical condition. Exercise regularly, as told by your health care provider. Change situations that cause you stress. Try to keep your work and personal schedule in balance. This information is not intended to replace advice given to you by your health care provider. Make sure you discuss any questions you have with your healthcare provider. Document Revised: 02/24/2020 Document Reviewed: 02/24/2020 Elsevier Patient Education  2022 [employer].

## 2020-11-22 NOTE — Progress Notes (Signed)
[address], [place]  [address] Phone:  [phone]   Fax:  [phone]    Established Patient Office Visit  Subjective:  Patient ID: Rhonda Mcgee, female    DOB: [birth year]  Age: 35 y.o. MRN: [id number]  CC:  Chief Complaint  Patient presents with   Follow-up    3 month follow up; was seen at ED yesterday vomiting and headaches missed neurology appointment this morning.     HPI Columbiana Saefong presents for follow up. She was seen in the ED on yesterday provided with IVF. She reports CT Scan but no results or orders are visible in the EMR.   Fatigue Patient complains of fatigue. Symptoms began several months ago. This has gotten worse since Friday. The patient feels the fatigue began with: exercise intolerance, a fever,  chest pain, shortness of breath and nausea and vomiting with headaches. Symptoms of her fatigue have been general malaise and headaches. Patient describes the following psychological symptoms: stress in the family. Patient denies cold intolerance, constipation, excessive menstrual bleeding, GI blood loss, significant change in weight, unusual rashes, and witnessed or suspected sleep apnea. Symptoms have gradually worsened. Symptom severity: struggles to carry out day to day responsibilities.. Previous visits for this problem:  IDA was given iron supplement however the pharmacy did not provide this and pt has used OTC supplements  .   Past Medical History:  Diagnosis Date   Chlamydia    GERD (gastroesophageal reflux disease)    Gonorrhea    Herpes genitalia    Lupus ([place])    Rheumatoid arthritis(714.0)     Past Surgical History:  Procedure Laterality Date   ADENOIDECTOMY     CHOLECYSTECTOMY     DILATION AND CURETTAGE OF UTERUS     elective ab   GALLBLADDER SURGERY     TONSILLECTOMY     TUBAL LIGATION      Family History  Adopted: Yes  Problem Relation Age of Onset   Diabetes Mother    Hypertension  Mother    Diabetes Sister    Hypertension Sister    Migraines Sister        with menstrual cycle, had vision loss with migraines   Lupus Maternal Grandmother    Esophageal cancer Neg Hx    Rectal cancer Neg Hx    Stomach cancer Neg Hx    Colon cancer Neg Hx     Social History   Socioeconomic History   Marital status: Single    Spouse name: Not on file   Number of children: 2   Years of education: Not on file   Highest education level: Some college, no degree  Occupational History   Not on file  Tobacco Use   Smoking status: Some Days    Types: Cigarettes   Smokeless tobacco: Never   Tobacco comments:    trying to quit, no smoking for last 2 weeks  Vaping Use   Vaping Use: Never used  Substance and Sexual Activity   Alcohol use: Yes    Comment: occasional   Drug use: No   Sexual activity: Yes    Birth control/protection: None, Surgical  Other Topics Concern   Not on file  Social History Narrative   Lives at home with her children   Right handed   Caffeine: 32 oz sweet tea/day   Social Determinants of Health   Financial Resource Strain: Not on file  Food Insecurity: Not on file  Transportation  Needs: Not on file  Physical Activity: Not on file  Stress: Not on file  Social Connections: Not on file  Intimate Partner Violence: Not on file    Outpatient Medications Prior to Visit  Medication Sig Dispense Refill   chlorhexidine (PERIDEX) 0.12 % solution SMARTSIG:By Mouth     hydroxychloroquine (PLAQUENIL) 200 MG tablet Take 1 tablet (200 mg total) by mouth daily. 90 tablet 0   ketoconazole (NIZORAL) 2 % shampoo Apply topically daily.     nortriptyline (PAMELOR) 25 MG capsule Start with '25mg'$  at bedtime and in 2 weeks increase to '50mg'$  at bedtime 180 capsule 3   ondansetron (ZOFRAN-ODT) 4 MG disintegrating tablet Take 1-2 tablets (4-8 mg total) by mouth every 8 (eight) hours as needed. 30 tablet 3   pantoprazole (PROTONIX) 40 MG tablet Take by mouth.      azithromycin (AZASITE) 1 % ophthalmic solution Apply to eye.     Ferrous Fumarate 150 MG TABS Take 1 tablet (150 mg total) by mouth daily. 90 tablet 3   LO LOESTRIN FE 1 MG-10 MCG / 10 MCG tablet Take 1 tablet by mouth daily. 90 tablet 3   metroNIDAZOLE (FLAGYL) 500 MG tablet Take 1 tablet (500 mg total) by mouth 2 (two) times daily. 14 tablet 0   No facility-administered medications prior to visit.    Allergies  Allergen Reactions   Cephalexin Hives   Keflex [Cephalexin] Hives    ROS Review of Systems    Objective:    Physical Exam Constitutional:      Appearance: She is obese.  HENT:     Head: Normocephalic and atraumatic.     Nose: Nose normal.     Mouth/Throat:     Mouth: Mucous membranes are moist.  Cardiovascular:     Rate and Rhythm: Normal rate and regular rhythm.     Pulses: Normal pulses.     Heart sounds: Normal heart sounds.  Pulmonary:     Effort: Pulmonary effort is normal.     Breath sounds: Normal breath sounds.  Musculoskeletal:        General: Normal range of motion.  Skin:    General: Skin is warm and dry.     Capillary Refill: Capillary refill takes less than 2 seconds.  Neurological:     General: No focal deficit present.     Mental Status: She is alert and oriented to person, place, and time.  Psychiatric:        Mood and Affect: Mood normal.        Behavior: Behavior normal.        Thought Content: Thought content normal.        Judgment: Judgment normal.    BP 115/67 (BP Location: Right Arm, Patient Position: Sitting)   Pulse 87   Temp 98.4 F (36.9 C)   Ht 5' 5.5" (1.664 m)   Wt 215 lb 0.8 oz (97.5 kg)   LMP 11/08/2020   SpO2 98%   BMI 35.24 kg/m  Wt Readings from Last 3 Encounters:  11/22/20 215 lb 0.8 oz (97.5 kg)  09/08/20 205 lb (93 kg)  08/23/20 213 lb (96.6 kg)     There are no preventive care reminders to display for this patient.   There are no preventive care reminders to display for this patient.  No results  found for: TSH Lab Results  Component Value Date   WBC 4.1 08/15/2020   HGB 11.5 (L) 08/15/2020   HCT 37.5 08/15/2020  MCV 78.3 (L) 08/15/2020   PLT 333 08/15/2020   Lab Results  Component Value Date   NA 138 08/15/2020   K 4.4 08/15/2020   CO2 23 08/15/2020   GLUCOSE 80 08/15/2020   BUN 10 08/15/2020   CREATININE 0.86 08/15/2020   BILITOT 0.5 08/15/2020   ALKPHOS 75 05/22/2020   AST 17 08/15/2020   ALT 14 08/15/2020   PROT 7.7 08/15/2020   ALBUMIN 3.5 05/22/2020   CALCIUM 9.3 08/15/2020   ANIONGAP 11 05/22/2020   No results found for: CHOL No results found for: HDL No results found for: LDLCALC No results found for: TRIG No results found for: CHOLHDL Lab Results  Component Value Date   HGBA1C 5.2 12/03/2018      Assessment & Plan:   Problem List Items Addressed This Visit   None Visit Diagnoses     Iron deficiency anemia, unspecified iron deficiency anemia type    -  Primary Fereheme infusion x 2 weeks   Relevant Orders   Iron, TIBC and Ferritin Panel   Fatigue, unspecified type       Relevant Orders   TSH   Cough       Upper respiratory tract infection, unspecified type       Relevant Medications   azithromycin (ZITHROMAX) 250 MG tablet       Meds ordered this encounter  Medications   azithromycin (AZASITE) 1 % ophthalmic solution    Sig: Place 1 drop into both eyes 2 (two) times daily.    Dispense:  2.5 mL    Refill:  0    Order Specific Question:   Supervising Provider    Answer:   Tresa Garter [id number]   nicotine (NICODERM CQ) 21 mg/24hr patch    Sig: Place 1 patch (21 mg total) onto the skin daily.    Dispense:  28 patch    Refill:  2    Order Specific Question:   Supervising Provider    Answer:   Tresa Garter [id number]   albuterol (PROAIR HFA) 108 (90 Base) MCG/ACT inhaler    Sig: Inhale 1-2 puffs into the lungs every 6 (six) hours as needed for wheezing or shortness of breath.    Dispense:  8 g    Refill:  1     Order Specific Question:   Supervising Provider    Answer:   Tresa Garter [id number]   benzonatate (TESSALON) 100 MG capsule    Sig: Take 1 capsule (100 mg total) by mouth 3 (three) times daily as needed for up to 10 days for cough. Never suck or chew on a benzonatate capsule.    Dispense:  30 capsule    Refill:  0    Do not place medication on "Automatic Refill". Please provide patient with medication counseling and recommendations.    Order Specific Question:   Supervising Provider    Answer:   Tresa Garter [id number]   azithromycin (ZITHROMAX) 250 MG tablet    Sig: Take 2 tablets on day 1, then 1 tablet daily on days 2 through 5    Dispense:  6 tablet    Refill:  0    Order Specific Question:   Supervising Provider    Answer:   Tresa Garter [id number]    Follow-up: Return for feraheme infusion tomorrow at 1000 thanks 3 mon fu [id number] thanks.    [name], NP

## 2020-11-22 NOTE — Telephone Encounter (Signed)
Transition Care Management Follow-up Telephone Call Date of discharge and from where: 11/21/2020-[facility]  How have you been since you were released from the hospital? Patient stated she is doing fine.  Any questions or concerns? No  Items Reviewed: Did the pt receive and understand the discharge instructions provided? Yes  Medications obtained and verified?  No medications given at discharge  Other? No  Any new allergies since your discharge? No  Dietary orders reviewed? N/A Do you have support at home? Yes   Home Care and Equipment/Supplies: Were home health services ordered? not applicable If so, what is the name of the agency? N/A  Has the agency set up a time to come to the patient's home? not applicable Were any new equipment or medical supplies ordered?  No What is the name of the medical supply agency? N/A Were you able to get the supplies/equipment? not applicable Do you have any questions related to the use of the equipment or supplies? No  Functional Questionnaire: (I = Independent and D = Dependent) ADLs: I  Bathing/Dressing- I  Meal Prep- I  Eating- I  Maintaining continence- I  Transferring/Ambulation- I  Managing Meds- I  Follow up appointments reviewed:  PCP Hospital f/u appt confirmed? Yes  Scheduled to see Dionisio David, NP on 11/22/2020 @ 10:20 am. [facility] f/u appt confirmed? No   Are transportation arrangements needed? No  If their condition worsens, is the pt aware to call PCP or go to the Emergency Dept.? Yes Was the patient provided with contact information for the PCP's office or ED? Yes Was to pt encouraged to call back with questions or concerns? Yes

## 2020-11-23 ENCOUNTER — Non-Acute Institutional Stay (HOSPITAL_COMMUNITY)
Admission: RE | Admit: 2020-11-23 | Discharge: 2020-11-23 | Disposition: A | Discharge status: Home / Self Care | Payer: Medicaid Other | Source: Ambulatory Visit | Attending: Internal Medicine | Admitting: Internal Medicine

## 2020-11-23 ENCOUNTER — Other Ambulatory Visit: Payer: Self-pay | Admitting: Nurse Practitioner

## 2020-11-23 DIAGNOSIS — D509 Iron deficiency anemia, unspecified: Secondary | ICD-10-CM | POA: Diagnosis not present

## 2020-11-23 LAB — TSH: TSH: 0.249 u[IU]/mL — ABNORMAL LOW (ref 0.450–4.500)

## 2020-11-23 LAB — IRON,TIBC AND FERRITIN PANEL
Ferritin: 17 ng/mL (ref 15–150)
Iron Saturation: 8 % — CL (ref 15–55)
Iron: 32 ug/dL (ref 27–159)
Total Iron Binding Capacity: 422 ug/dL (ref 250–450)
UIBC: 390 ug/dL (ref 131–425)

## 2020-11-23 MED ORDER — FERROUS FUMARATE 150 MG PO TABS: 1.0000 | ORAL_TABLET | Freq: Every day | ORAL | 3 refills | Status: DC

## 2020-11-23 MED ORDER — SODIUM CHLORIDE 0.9 % IV SOLN
INTRAVENOUS | Status: DC | PRN
Start: 2020-11-23 — End: 2020-11-24
  Administered 2020-11-23: 250 mL via INTRAVENOUS

## 2020-11-23 MED ORDER — SODIUM CHLORIDE 0.9% FLUSH: 10.0000 mL | INTRAVENOUS | Status: DC | PRN

## 2020-11-23 MED ORDER — SODIUM CHLORIDE 0.9 % IV SOLN
510.0000 mg | INTRAVENOUS | Status: DC
  Administered 2020-11-23: 510 mg via INTRAVENOUS
  Filled 2020-11-23: qty 510

## 2020-11-23 NOTE — Progress Notes (Signed)
PATIENT CARE CENTER NOTE   Diagnosis: Iron Deficiency Anemia    Provider: Dionisio David, FNP   Procedure: Feraheme infusion    Note:  Patient received Feraheme infusion (1 of 2) via PIV. Tolerated well with no adverse reaction. Observed patient for 30 minutes post infusion. Vital signs stable. Discharge instructions given. Patient to come back on 12/04/20 for second infusion. Alert, oriented and ambulatory at discharge.

## 2020-11-24 ENCOUNTER — Other Ambulatory Visit: Payer: Self-pay | Admitting: Nurse Practitioner

## 2020-11-24 DIAGNOSIS — Z1329 Encounter for screening for other suspected endocrine disorder: Secondary | ICD-10-CM

## 2020-11-24 NOTE — Progress Notes (Unsigned)
   [facility] [address] [place], [place]  [address] Phone:  [phone]   Fax:  [phone] 

## 2020-11-25 LAB — T4, FREE

## 2020-11-25 LAB — T3

## 2020-11-25 LAB — SPECIMEN STATUS REPORT

## 2020-11-26 NOTE — Hospital Course (Signed)
Iron Deficiency Anemia

## 2020-11-28 ENCOUNTER — Other Ambulatory Visit: Payer: Self-pay

## 2020-11-28 ENCOUNTER — Emergency Department (HOSPITAL_COMMUNITY): Payer: Medicaid Other

## 2020-11-28 ENCOUNTER — Encounter (HOSPITAL_COMMUNITY): Payer: Self-pay | Admitting: Emergency Medicine

## 2020-11-28 ENCOUNTER — Emergency Department (HOSPITAL_COMMUNITY)
Admission: EM | Admit: 2020-11-28 | Discharge: 2020-11-28 | Disposition: A | Discharge status: Home / Self Care | Payer: Medicaid Other | Attending: Emergency Medicine | Admitting: Emergency Medicine

## 2020-11-28 DIAGNOSIS — G43809 Other migraine, not intractable, without status migrainosus: Secondary | ICD-10-CM | POA: Diagnosis not present

## 2020-11-28 DIAGNOSIS — U071 COVID-19: Secondary | ICD-10-CM | POA: Insufficient documentation

## 2020-11-28 DIAGNOSIS — F17210 Nicotine dependence, cigarettes, uncomplicated: Secondary | ICD-10-CM | POA: Diagnosis not present

## 2020-11-28 DIAGNOSIS — R519 Headache, unspecified: Secondary | ICD-10-CM | POA: Diagnosis not present

## 2020-11-28 DIAGNOSIS — R059 Cough, unspecified: Secondary | ICD-10-CM | POA: Diagnosis not present

## 2020-11-28 DIAGNOSIS — R1110 Vomiting, unspecified: Secondary | ICD-10-CM | POA: Diagnosis not present

## 2020-11-28 LAB — COMPREHENSIVE METABOLIC PANEL
ALT: 24 U/L (ref 0–44)
AST: 25 U/L (ref 15–41)
Albumin: 3.6 g/dL (ref 3.5–5.0)
Alkaline Phosphatase: 66 U/L (ref 38–126)
Anion gap: 12 (ref 5–15)
BUN: 7 mg/dL (ref 6–20)
CO2: 18 mmol/L — ABNORMAL LOW (ref 22–32)
Calcium: 9.1 mg/dL (ref 8.9–10.3)
Chloride: 102 mmol/L (ref 98–111)
Creatinine, Ser: 1.24 mg/dL — ABNORMAL HIGH (ref 0.44–1.00)
GFR, Estimated: 59 mL/min — ABNORMAL LOW (ref >60)
Glucose, Bld: 108 mg/dL — ABNORMAL HIGH (ref 70–99)
Potassium: 3.4 mmol/L — ABNORMAL LOW (ref 3.5–5.1)
Sodium: 132 mmol/L — ABNORMAL LOW (ref 135–145)
Total Bilirubin: 0.7 mg/dL (ref 0.3–1.2)
Total Protein: 7.5 g/dL (ref 6.5–8.1)

## 2020-11-28 LAB — CBC WITH DIFFERENTIAL/PLATELET
Abs Immature Granulocytes: 0.02 10*3/uL (ref 0.00–0.07)
Basophils Absolute: 0 10*3/uL (ref 0.0–0.1)
Basophils Relative: 0 %
Eosinophils Absolute: 0 10*3/uL (ref 0.0–0.5)
Eosinophils Relative: 0 %
HCT: 39.3 % (ref 36.0–46.0)
Hemoglobin: 12 g/dL (ref 12.0–15.0)
Immature Granulocytes: 0 %
Lymphocytes Relative: 26 %
Lymphs Abs: 1.2 10*3/uL (ref 0.7–4.0)
MCH: 24.4 pg — ABNORMAL LOW (ref 26.0–34.0)
MCHC: 30.5 g/dL (ref 30.0–36.0)
MCV: 80 fL (ref 80.0–100.0)
Monocytes Absolute: 1 10*3/uL (ref 0.1–1.0)
Monocytes Relative: 22 %
Neutro Abs: 2.4 10*3/uL (ref 1.7–7.7)
Neutrophils Relative %: 52 %
Platelets: 289 10*3/uL (ref 150–400)
RBC: 4.91 MIL/uL (ref 3.87–5.11)
RDW: 20.5 % — ABNORMAL HIGH (ref 11.5–15.5)
WBC: 4.7 10*3/uL (ref 4.0–10.5)
nRBC: 0 % (ref 0.0–0.2)

## 2020-11-28 LAB — I-STAT BETA HCG BLOOD, ED (MC, WL, AP ONLY): I-stat hCG, quantitative: <5 m[IU]/mL (ref <5)

## 2020-11-28 LAB — RESP PANEL BY RT-PCR (FLU A&B, COVID) ARPGX2
Influenza A by PCR: NEGATIVE
Influenza B by PCR: NEGATIVE
SARS Coronavirus 2 by RT PCR: POSITIVE — AB

## 2020-11-28 LAB — LIPASE, BLOOD: Lipase: 29 U/L (ref 11–51)

## 2020-11-28 MED ORDER — SODIUM CHLORIDE 0.9 % IV BOLUS
500.0000 mL | Freq: Once | INTRAVENOUS | Status: AC
Start: 2020-11-28 — End: 2020-11-28
  Administered 2020-11-28: 500 mL via INTRAVENOUS

## 2020-11-28 MED ORDER — DIPHENHYDRAMINE HCL 50 MG/ML IJ SOLN
12.5000 mg | Freq: Once | INTRAMUSCULAR | Status: AC
  Administered 2020-11-28: 12.5 mg via INTRAVENOUS
  Filled 2020-11-28: qty 1

## 2020-11-28 MED ORDER — METOCLOPRAMIDE HCL 5 MG/ML IJ SOLN
10.0000 mg | Freq: Once | INTRAMUSCULAR | Status: AC
  Administered 2020-11-28: 10 mg via INTRAVENOUS
  Filled 2020-11-28: qty 2

## 2020-11-28 MED ORDER — ACETAMINOPHEN 325 MG PO TABS
650.0000 mg | ORAL_TABLET | Freq: Once | ORAL | Status: AC
  Administered 2020-11-28: 650 mg via ORAL
  Filled 2020-11-28: qty 2

## 2020-11-28 MED ORDER — SODIUM CHLORIDE 0.9 % IV BOLUS
1000.0000 mL | Freq: Once | INTRAVENOUS | Status: AC
  Administered 2020-11-28: 1000 mL via INTRAVENOUS

## 2020-11-28 NOTE — ED Notes (Signed)
Pt stated that she threw up the tylenol she was given in triage.

## 2020-11-28 NOTE — ED Triage Notes (Signed)
Pt reports congestion, headache, vomiting mucus, and chills for 3 days.

## 2020-11-28 NOTE — Discharge Instructions (Signed)
Your COVID test was positive here. You are dehydrated based on your lab work to make sure you are drinking plenty of fluids to prevent this from getting worse. Take Tylenol as needed to help with fever or pain. Follow-up with your primary care provider. Return to the ER if you start to experience chest pain, trouble breathing, uncontrollable vomiting or lightheadedness.

## 2020-11-28 NOTE — ED Notes (Signed)
RN reviewed discharge instructions w/ pt. Follow up and symptom management reviewed, pt had no further questions. 

## 2020-11-28 NOTE — ED Provider Notes (Signed)
Emergency Medicine Provider Triage Evaluation Note  Rhonda Mcgee , a 35 y.o. female  was evaluated in triage.  Pt complains of cough, congestion, headache, vomiting.  Review of Systems  Positive: Vomiting, headache, congestion, cough Negative: Abd pain  Physical Exam  BP 106/78 (BP Location: Right Arm)   Pulse (!) 117   Temp 99.8 F (37.7 C) (Oral)   Resp 18   LMP 11/08/2020   SpO2 100%  Gen:   Awake, no distress   Resp:  Normal effort  MSK:   Moves extremities without difficulty   Medical Decision Making  Medically screening exam initiated at 12:57 PM.  Appropriate orders placed.  Dorit Hovis was informed that the remainder of the evaluation will be completed by another provider, this initial triage assessment does not replace that evaluation, and the importance of remaining in the ED until their evaluation is complete.     Bishop Dublin 11/28/20 1258    Wyvonnia Dusky, MD 11/28/20 [phone]

## 2020-11-28 NOTE — ED Provider Notes (Signed)
[facility] EMERGENCY DEPARTMENT Provider Note   CSN: [id number] Arrival date & time: 11/28/20  1228     History Chief Complaint  Patient presents with   Headache   Nasal Congestion    Rhonda Mcgee is a 35 y.o. female with a past medical history of GERD presenting to the ED for 3-day history of several episodes of nonbloody, nonbilious emesis, congestion, cough productive with white mucus, headache with associated photophobia and phonophobia.  She has tried over-the-counter cold and flu medications and Tylenol with only minimal improvement in her symptoms.  No sick contacts with similar symptoms.  States that she typically gets headaches but this feels worse and is not improving.  She denies any trouble breathing.  She was placed on a Z-Pak last week for her cough but was never diagnosed with pneumonia but she feels that this has not improved.  She denies any abdominal pain, injuries or falls, numbness in arms or legs or chest pain.  HPI     Past Medical History:  Diagnosis Date   Chlamydia    GERD (gastroesophageal reflux disease)    Gonorrhea    Herpes genitalia    Lupus ([place])    Rheumatoid arthritis(714.0)     Patient Active Problem List   Diagnosis Date Noted   High risk medication use 08/15/2020   Chronic migraine without aura, with intractable migraine, so stated, with status migrainosus 08/14/2020   Keratoconjunctivitis sicca of both eyes not specified as Sjogren's 06/04/2019   Abdominal pain, epigastric 12/04/2018   GERD (gastroesophageal reflux disease) 11/10/2018   Gastritis 11/10/2018   NSAID induced gastritis 11/10/2018   Intractable nausea and vomiting 11/09/2018   Lupus (HCC)    Hypokalemia    Vomiting    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 SAB x 1. EAB x 1         Family History  Adopted: Yes  Problem Relation Age of Onset   Diabetes Mother    Hypertension Mother    Diabetes Sister    Hypertension Sister    Migraines Sister        with menstrual cycle, had vision loss with migraines   Lupus Maternal Grandmother    Esophageal cancer Neg Hx    Rectal cancer Neg Hx    Stomach cancer Neg Hx    Colon cancer Neg Hx     Social History   Tobacco Use   Smoking status: Some Days    Types: Cigarettes   Smokeless tobacco: Never   Tobacco comments:    trying to quit, no smoking for last 2 weeks  Vaping Use  Vaping Use: Never used  Substance Use Topics   Alcohol use: Yes    Comment: occasional   Drug use: No    Home Medications Prior to Admission medications   Medication Sig Start Date End Date Taking? Authorizing Provider  albuterol (PROAIR HFA) 108 (90 Base) MCG/ACT inhaler Inhale 1-2 puffs into the lungs every 6 (six) hours as needed for wheezing or shortness of breath. 11/22/20 11/22/21 Yes King, Diona Foley, NP  azithromycin (AZASITE) 1 % ophthalmic solution Place 1 drop into both eyes 2 (two) times daily. 11/22/20 11/22/21 Yes [name], NP  hydroxychloroquine (PLAQUENIL) 200 MG tablet Take 1 tablet (200 mg total) by mouth daily. 08/15/20  Yes Rice, Resa Miner, MD  ketoconazole (NIZORAL) 2 % shampoo Apply topically daily. 06/07/20  Yes [provider]  pantoprazole (PROTONIX) 40 MG tablet Take by mouth. 03/04/20  Yes [provider]    Allergies    Cephalexin and Keflex [cephalexin]  Review of Systems   Review of Systems  Constitutional:  Negative for appetite  change, chills and fever.  HENT:  Positive for congestion. Negative for ear pain, rhinorrhea, sneezing and sore throat.   Eyes:  Negative for photophobia and visual disturbance.  Respiratory:  Positive for cough. Negative for chest tightness, shortness of breath and wheezing.   Cardiovascular:  Negative for chest pain and palpitations.  Gastrointestinal:  Positive for nausea and vomiting. Negative for abdominal pain, blood in stool, constipation and diarrhea.  Genitourinary:  Negative for dysuria, hematuria and urgency.  Musculoskeletal:  Negative for myalgias.  Skin:  Negative for rash.  Neurological:  Positive for headaches. Negative for dizziness, weakness and light-headedness.   Physical Exam Updated Vital Signs BP (!) 127/105   Pulse (!) 101   Temp 99.8 F (37.7 C) (Oral)   Resp 20   LMP 11/08/2020   SpO2 100%   Physical Exam Vitals and nursing note reviewed.  Constitutional:      General: She is not in acute distress.    Appearance: She is well-developed.  HENT:     Head: Normocephalic and atraumatic.     Nose: Nose normal.  Eyes:     General: No scleral icterus.       Left eye: No discharge.     Conjunctiva/sclera: Conjunctivae normal.  Cardiovascular:     Rate and Rhythm: Normal rate and regular rhythm.     Heart sounds: Normal heart sounds. No murmur heard.   No friction rub. No gallop.  Pulmonary:     Effort: Pulmonary effort is normal. No respiratory distress.     Breath sounds: Normal breath sounds.  Abdominal:     General: Bowel sounds are normal. There is no distension.     Palpations: Abdomen is soft.     Tenderness: There is no abdominal tenderness. There is no guarding.  Musculoskeletal:        General: Normal range of motion.     Cervical back: Normal range of motion and neck supple.  Skin:    General: Skin is warm and dry.     Findings: No rash.  Neurological:     Mental Status: She is alert and oriented to person, place, and time.     Cranial  Nerves: No cranial nerve deficit.     Sensory: No sensory deficit.     Motor: No weakness or abnormal muscle tone.     Coordination: Coordination normal.    ED Results / Procedures / Treatments   Labs (all  labs ordered are listed, but only abnormal results are displayed) Labs Reviewed  RESP PANEL BY RT-PCR (FLU A&B, COVID) ARPGX2 - Abnormal; Notable for the following components:      Result Value   SARS Coronavirus 2 by RT PCR POSITIVE (*)    All other components within normal limits  CBC WITH DIFFERENTIAL/PLATELET - Abnormal; Notable for the following components:   MCH 24.4 (*)    RDW 20.5 (*)    All other components within normal limits  COMPREHENSIVE METABOLIC PANEL - Abnormal; Notable for the following components:   Sodium 132 (*)    Potassium 3.4 (*)    CO2 18 (*)    Glucose, Bld 108 (*)    Creatinine, Ser 1.24 (*)    GFR, Estimated 59 (*)    All other components within normal limits  LIPASE, BLOOD  I-STAT BETA HCG BLOOD, ED ([facility], [facility], [facility] ONLY)    EKG None  Radiology DG Chest Portable 1 View  Result Date: 11/28/2020 CLINICAL DATA:  Cough, congestion, headache, vomiting. EXAM: PORTABLE CHEST 1 VIEW COMPARISON:  Chest radiograph dated 03/03/2020. FINDINGS: The heart size and mediastinal contours are within normal limits. Both lungs are clear. The visualized skeletal structures are unremarkable. IMPRESSION: No active disease. Electronically Signed   By: Zerita Boers M.D.   On: 11/28/2020 17:31    Procedures Procedures   Medications Ordered in ED Medications  sodium chloride 0.9 % bolus 500 mL (has no administration in time range)  acetaminophen (TYLENOL) tablet 650 mg (650 mg Oral Given 11/28/20 1304)  sodium chloride 0.9 % bolus 1,000 mL (1,000 mLs Intravenous New Bag/Given 11/28/20 1740)  metoCLOPramide (REGLAN) injection 10 mg (10 mg Intravenous Given 11/28/20 1751)  diphenhydrAMINE (BENADRYL) injection 12.5 mg (12.5 mg Intravenous Given 11/28/20 1745)    ED Course  I  have reviewed the triage vital signs and the nursing notes.  Pertinent labs & imaging results that were available during my care of the patient were reviewed by me and considered in my medical decision making (see chart for details).    MDM Rules/Calculators/A&P                           Rhonda Mcgee was evaluated in Emergency Department on 11/28/20 for the symptoms described in the history of present illness. He/she was evaluated in the context of the global COVID-19 pandemic, which necessitated consideration that the patient might be at risk for infection with the SARS-CoV-2 virus that causes COVID-19. Institutional protocols and algorithms that pertain to the evaluation of patients at risk for COVID-19 are in a state of rapid change based on information released by regulatory bodies including the CDC and federal and state organizations. These policies and algorithms were followed during the patient's care in the ED.   35 year old female presenting to the ED for 3-day history of congestion, headache, cough productive with mucus, myalgias.  Minimal improvement noted with over-the-counter cough and cold medication.  Given Z-Pak by her PCP last week but continues to have cough.  Denies any trouble breathing.  She typically gets headaches but this has lasted longer.  On exam patient without any neurological deficits.  Her vital signs are within normal limits.  She has no meningeal signs.  Her abdomen is soft.  She is not vomiting on my exam.  Chest x-ray is unremarkable.  CBC unremarkable.  CMP with slight elevation in creatinine, she was given IV fluids to help with this.  hCG is negative.  COVID test is positive here.  I suspect this is the cause of her symptoms along with a migraine headache. There are no headache characteristics that are lateralizing or concerning for increased ICP, infectious or vascular cause of her symptoms.  We will have her continue home medications, increase hydration and  follow-up with PCP.  Tolerating p.o. intake here.  Patient is agreeable to the plan.  Patient is hemodynamically stable, in NAD, and able to ambulate in the ED. Evaluation does not show pathology that would require ongoing emergent intervention or inpatient treatment. I explained the diagnosis to the patient. Pain has been managed and has no complaints prior to discharge. Patient is comfortable with above plan and is stable for discharge at this time. All questions were answered prior to disposition. Strict return precautions for returning to the ED were discussed. Encouraged follow up with PCP.   An After Visit Summary was printed and given to the patient.   Portions of this note were generated with [employer]. Dictation errors may occur despite best attempts at proofreading.  Final Clinical Impression(s) / ED Diagnoses Final diagnoses:  COVID-19 virus infection  Other migraine without status migrainosus, not intractable    Rx / DC Orders ED Discharge Orders     None        Delia Heady, PA-C 11/28/20 1853    Blanchie Dessert, MD 11/28/20 2141

## 2020-11-29 LAB — T3: T3, Total: 94 ng/dL (ref 71–180)

## 2020-11-29 LAB — T4, FREE: Free T4: 0.96 ng/dL (ref 0.82–1.77)

## 2020-11-29 LAB — SPECIMEN STATUS REPORT

## 2020-12-04 ENCOUNTER — Other Ambulatory Visit: Payer: Self-pay

## 2020-12-04 ENCOUNTER — Non-Acute Institutional Stay (HOSPITAL_COMMUNITY): Admission: RE | Admit: 2020-12-04 | Payer: Medicaid Other | Source: Ambulatory Visit

## 2020-12-05 ENCOUNTER — Ambulatory Visit: Payer: Medicaid Other | Admitting: Internal Medicine

## 2020-12-18 NOTE — Progress Notes (Signed)
Office Visit Note  Patient: Rhonda Mcgee             Date of Birth: Sep 17, 1985           MRN: [id number]             PCP: [name], NP Referring: [name], NP Visit Date: 12/19/2020   Subjective:  Follow-up (Patient is doing fairly well on PLQ. )   History of Present Illness: Rhonda Mcgee is a 35 y.o. female here for follow up for discoid lupus after restarting hydroxychloroquine '200mg'$  daily since about 4 months ago. Over the interval she received 1 dose of IV iron for her anemia and felt improvement in energy level and decreased pica. She caught COVID and had multiple symptoms with fever, headaches, body aches, fatigue, cough, shortness of breath, but recovered from this she does notice a mild persistent amount of nonproductive cough and some increased leg swelling.   Previous HPI 08/15/20 Kemani Pursley is a 35 y.o. female here for evaluation of possible systemic lupus. 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  She was previously seen with rheumatology with [facility] symptoms treated on hydroxychloroquine she describes visual changes on 400 mg dose although was able to resume 200 mg daily dose apparently without return of visual problems.  Reported disease manifestations at that time included photosensitive rashes, mucosal ulcers, and sicca syndrome.  Currently she is having some arthralgias also a significant amount of dry eye symptoms. The biggest complaint is the continued skin disease especially with involvement of her lower eyelids bilaterally and central face.  She is seeing ophthalmology and has had discussion of Acthar injections for the ongoing symptoms.  She does continue to smoke cigarettes.   Labs reviewed Urinalysis 08/06/20 negative for protein or blood   Imaging reviewed CXR 01/2020 negative for airspace disease   Review of Systems  Constitutional:  Positive for fatigue.  HENT:  Negative  for mouth sores, mouth dryness and nose dryness.   Eyes:  Positive for pain, itching and dryness. Negative for visual disturbance.  Respiratory:  Positive for cough, shortness of breath and difficulty breathing. Negative for hemoptysis.   Cardiovascular:  Positive for chest pain, palpitations and swelling in legs/feet.  Gastrointestinal:  Negative for abdominal pain, blood in stool, constipation and diarrhea.  Endocrine: Negative for increased urination.  Genitourinary:  Negative for painful urination.  Musculoskeletal:  Positive for joint pain, joint pain, joint swelling, myalgias, muscle weakness, morning stiffness, muscle tenderness and myalgias.  Skin:  Negative for color change, rash and redness.  Allergic/Immunologic: Negative for susceptible to infections.  Neurological:  Positive for headaches. Negative for dizziness, numbness, memory loss and weakness.  Hematological:  Negative for swollen glands.  Psychiatric/Behavioral:  Positive for sleep disturbance. Negative for confusion.    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   Genital herpes 11/02/2013   Arthropathy  07/14/2013   Visual disturbances 12/06/2010    Past Medical History:  Diagnosis Date   Chlamydia    GERD (gastroesophageal reflux disease)    Gonorrhea    Herpes genitalia    Lupus ([place])    Rheumatoid arthritis(714.0)     Family History  Adopted:  Yes  Problem Relation Age of Onset   Diabetes Mother    Hypertension Mother    Diabetes Sister    Hypertension Sister    Migraines Sister        with menstrual cycle, had vision loss with migraines   Lupus Maternal Grandmother    Esophageal cancer Neg Hx    Rectal cancer Neg Hx    Stomach cancer Neg Hx    Colon cancer Neg Hx    Past Surgical History:  Procedure Laterality Date   ADENOIDECTOMY     CHOLECYSTECTOMY     DILATION AND CURETTAGE OF UTERUS     elective ab   GALLBLADDER SURGERY     TONSILLECTOMY     TUBAL LIGATION     Social History   Social History Narrative   Lives at home with her children   Right handed   Caffeine: 32 oz sweet tea/day   Immunization History  Administered Date(s) Administered   PFIZER(Purple Top)SARS-COV-2 Vaccination 07/22/2019, 08/17/2019   PPD Test 06/24/2014, 07/12/2014     Objective: Vital Signs: BP 117/84 (BP Location: Left Arm, Patient Position: Sitting, Cuff Size: Normal)   Pulse 94   Ht 5' 5.5" (1.664 m)   Wt 214 lb 9.6 oz (97.3 kg)   BMI 35.17 kg/m    Physical Exam Constitutional:      Appearance: She is obese.  Eyes:     Conjunctiva/sclera: Conjunctivae normal.  Skin:    General: Skin is warm and dry.     Comments: Hyperpigmented patches under both eyes, without induration or erythema, with a few scattered flat hyperpigmented patches on face  Neurological:     Mental Status: She is alert.  Psychiatric:        Mood and Affect: Mood normal.     Musculoskeletal Exam:  Shoulders full ROM no tenderness or swelling Elbows full ROM no tenderness or swelling Wrists full ROM no tenderness or swelling Fingers full ROM no tenderness or swelling Knees full ROM no tenderness or swelling Ankles full ROM no tenderness or swelling MTPs full ROM no tenderness or swelling   Investigation: No additional findings.  Imaging: DG Chest Portable 1 View  Result Date: 11/28/2020 CLINICAL DATA:  Cough, congestion, headache,  vomiting. EXAM: PORTABLE CHEST 1 VIEW COMPARISON:  Chest radiograph dated 03/03/2020. FINDINGS: The heart size and mediastinal contours are within normal limits. Both lungs are clear. The visualized skeletal structures are unremarkable. IMPRESSION: No active disease. Electronically Signed   By: Zerita Boers M.D.   On: 11/28/2020 17:31    Recent Labs: Lab Results  Component Value Date   WBC 4.7 11/28/2020   HGB 12.0 11/28/2020   PLT 289 11/28/2020   NA 132 (L) 11/28/2020   K 3.4 (L) 11/28/2020   CL 102 11/28/2020   CO2 18 (L) 11/28/2020   GLUCOSE 108 (H) 11/28/2020   BUN 7 11/28/2020   CREATININE 1.24 (H) 11/28/2020   BILITOT 0.7 11/28/2020   ALKPHOS 66 11/28/2020   AST 25 11/28/2020   ALT 24 11/28/2020   PROT 7.5 11/28/2020   ALBUMIN 3.6 11/28/2020   CALCIUM 9.1 11/28/2020   GFRAA 103 08/15/2020    Speciality Comments:  No specialty comments available.  Procedures:  No procedures performed Allergies: Cephalexin and Keflex [cephalexin]   Assessment / Plan:     Visit Diagnoses: Lupus ([place])  Discoid lupus erythematosus of eyelid, unspecified laterality - Plan: Sedimentation rate, CBC with Differential/Platelet, Protein / creatinine ratio, urine, hydroxychloroquine (PLAQUENIL) 200 MG tablet  History of lupus current manifestations of discoid/chronic cutaneous lupus. Tolerating HCQ treatment and no extension of rashes or systemic inflammation noted, plan to increase does to 400 mg daily. Rechecking sed rate and CBC for monitoring at this time. She has follow up planned with ophthalmology in the winter   Keratoconjunctivitis sicca of both eyes not specified as Sjogren's  Following up with ophthalmology, no specific exacerbation continuing ophthalmic drops.  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  No pitting describe no  current edema or stasis dermatitis changes on exam today.  Considering history of lupus and recent COVID infection will check urine protein although low suspicion for nephrotic syndrome. Discussed leg elevation and use of compression socks as initial management.   Orders: Orders Placed This Encounter  Procedures   Sedimentation rate   CBC with Differential/Platelet   Protein / creatinine ratio, urine    Meds ordered this encounter  Medications   hydroxychloroquine (PLAQUENIL) 200 MG tablet    Sig: Take 2 tablets (400 mg total) by mouth daily.    Dispense:  180 tablet    Refill:  1      Follow-Up Instructions: Return in about 6 months (around 06/21/2021) for Lupus HC 400 mg f/u 97mo.   CCollier Salina MD  Note - This record has been created using [employer]  Chart creation errors have been sought, but may not always  have been located. Such creation errors do not reflect on  the standard of medical care.

## 2020-12-19 ENCOUNTER — Other Ambulatory Visit: Payer: Self-pay

## 2020-12-19 ENCOUNTER — Ambulatory Visit (INDEPENDENT_AMBULATORY_CARE_PROVIDER_SITE_OTHER): Payer: Medicaid Other | Admitting: Internal Medicine

## 2020-12-19 ENCOUNTER — Encounter: Payer: Self-pay | Admitting: Internal Medicine

## 2020-12-19 VITALS — BP 117/84 | HR 94 | Ht 65.5 in | Wt 214.6 lb

## 2020-12-19 DIAGNOSIS — R609 Edema, unspecified: Secondary | ICD-10-CM | POA: Insufficient documentation

## 2020-12-19 DIAGNOSIS — H01129 Discoid lupus erythematosus of unspecified eye, unspecified eyelid: Secondary | ICD-10-CM | POA: Diagnosis not present

## 2020-12-19 DIAGNOSIS — H16223 Keratoconjunctivitis sicca, not specified as Sjogren's, bilateral: Secondary | ICD-10-CM

## 2020-12-19 DIAGNOSIS — M329 Systemic lupus erythematosus, unspecified: Secondary | ICD-10-CM

## 2020-12-19 MED ORDER — HYDROXYCHLOROQUINE SULFATE 200 MG PO TABS
400.0000 mg | ORAL_TABLET | Freq: Every day | ORAL | 1 refills | Status: DC
Start: 2020-12-19 — End: 2021-06-23

## 2020-12-20 ENCOUNTER — Encounter: Payer: Self-pay | Admitting: Radiology

## 2020-12-20 LAB — CBC WITH DIFFERENTIAL/PLATELET
Absolute Monocytes: 409 cells/uL (ref 200–950)
Basophils Absolute: 42 cells/uL (ref 0–200)
Basophils Relative: 0.9 %
Eosinophils Absolute: 122 cells/uL (ref 15–500)
Eosinophils Relative: 2.6 %
HCT: 39.3 % (ref 35.0–45.0)
Hemoglobin: 12.1 g/dL (ref 11.7–15.5)
Lymphs Abs: 2797 cells/uL (ref 850–3900)
MCH: 25.5 pg — ABNORMAL LOW (ref 27.0–33.0)
MCHC: 30.8 g/dL — ABNORMAL LOW (ref 32.0–36.0)
MCV: 82.7 fL (ref 80.0–100.0)
MPV: 10.3 fL (ref 7.5–12.5)
Monocytes Relative: 8.7 %
Neutro Abs: 1330 cells/uL — ABNORMAL LOW (ref 1500–7800)
Neutrophils Relative %: 28.3 %
Platelets: 247 10*3/uL (ref 140–400)
RBC: 4.75 10*6/uL (ref 3.80–5.10)
RDW: 22 % — ABNORMAL HIGH (ref 11.0–15.0)
Total Lymphocyte: 59.5 %
WBC: 4.7 10*3/uL (ref 3.8–10.8)

## 2020-12-20 LAB — PROTEIN / CREATININE RATIO, URINE
Creatinine, Urine: 119 mg/dL (ref 20–275)
Protein/Creat Ratio: 42 mg/g creat (ref 21–161)
Protein/Creatinine Ratio: 0.042 mg/mg creat (ref 0.021–0.161)
Total Protein, Urine: 5 mg/dL (ref 5–24)

## 2020-12-20 LAB — SEDIMENTATION RATE: Sed Rate: 34 mm/h — ABNORMAL HIGH (ref 0–20)

## 2020-12-20 NOTE — Progress Notes (Signed)
Labs look fine for continuing hydroxychloroquine and her blood count is now normal, improved from 4 months ago.

## 2020-12-27 ENCOUNTER — Telehealth: Payer: Self-pay | Admitting: Nurse Practitioner

## 2020-12-27 NOTE — Telephone Encounter (Signed)
..   Medicaid Managed Care   Unsuccessful Outreach Note  12/27/2020 Name: Rhonda Mcgee MRN: [id number] DOB: 12-Jun-1985  Referred by: [name], NP Reason for referral : High Risk Managed Medicaid (I called the patient today to get her phone visit with the MM RNCM rescheduled. I was not able to leave a message,)   An unsuccessful telephone outreach was attempted today. The patient was referred to the case management team for assistance with care management and care coordination.   Follow Up Plan: The care management team will reach out to the patient again over the next 7 days.   Marland Kitchenj

## 2021-02-08 ENCOUNTER — Encounter: Payer: Self-pay | Admitting: Physician Assistant

## 2021-02-08 ENCOUNTER — Other Ambulatory Visit: Payer: Self-pay

## 2021-02-08 ENCOUNTER — Ambulatory Visit (INDEPENDENT_AMBULATORY_CARE_PROVIDER_SITE_OTHER): Payer: Medicaid Other | Admitting: Physician Assistant

## 2021-02-08 DIAGNOSIS — L930 Discoid lupus erythematosus: Secondary | ICD-10-CM | POA: Diagnosis not present

## 2021-02-08 DIAGNOSIS — Z840 Family history of diseases of the skin and subcutaneous tissue: Secondary | ICD-10-CM

## 2021-02-08 MED ORDER — TACROLIMUS 0.1 % EX OINT: TOPICAL_OINTMENT | Freq: Every day | CUTANEOUS | 0 refills | Status: AC

## 2021-02-08 MED ORDER — TRIAMCINOLONE ACETONIDE 0.1 % EX OINT: 1.0000 "application " | TOPICAL_OINTMENT | Freq: Two times a day (BID) | CUTANEOUS | 0 refills | Status: AC | PRN

## 2021-02-08 NOTE — Progress Notes (Signed)
   New Patient   Subjective  Rhonda Mcgee is a 35 y.o. female who presents for the following: New Patient (Initial Visit) (Patient here today for cutaneous lupus erythematosus of eyelids. Biopsy proven by Opthamologist. Patient states that she was dignosied with lupus in 2014. Per patient she's been on several topicals, and eye drops for her eye, patient states that her eyes are painful, she also has lesions on her face that are painful at times. Patient states that this does affect her quality of life. Patient's maternal grandmother also had lupus.) No history of glaucoma or increased eye pressure per patient.   The following portions of the chart were reviewed this encounter and updated as appropriate:  Tobacco  Allergies  Meds  Problems  Med Hx  Surg Hx  Fam Hx      Objective  Well appearing patient in no apparent distress; mood and affect are within normal limits.  A focused examination was performed including face. Relevant physical exam findings are noted in the Assessment and Plan.  Face and eyelids Confluent, hyperpigmented plaques lower eyelids >upper eyelids. Here eyelashes on lower lids do not grow. She also has scattered post inflammatory macules on her face.        Assessment & Plan  Discoid lupus erythematosus Face and eyelids  Strict sun avoidance. Nancy Fetter glasses whenever outdoors. She was provided with mineral sun screen to apply daily.  triamcinolone ointment (KENALOG) 0.1 % - Face and eyelids Apply 1 application topically 2 (two) times daily as needed (Rash). Apply to face twice a day x 7 days. On day 8 use in am only.  tacrolimus (PROTOPIC) 0.1 % ointment - Face and eyelids Apply topically daily on day 8 in pm.  Return to clinic in 2 weeks.   I, Keturah Yerby, PA-C, have reviewed all documentation's for this visit.  The documentation on 02/08/21 for the exam, diagnosis, procedures and orders are all accurate and complete.

## 2021-02-08 NOTE — Patient Instructions (Signed)
Triamcinolone for 7 days then start the tacrolimus after you stop the triamcinolone

## 2021-02-13 ENCOUNTER — Telehealth: Payer: Self-pay | Admitting: Physician Assistant

## 2021-02-13 NOTE — Telephone Encounter (Signed)
[name] Key: [id number] help? Call us at [phone] Status Sent to [facility] Tacrolimus 0.1% cream Form Blue [employer] control surveyor Form (CB   Rhonda Mcgee (Key: [id number])  Your information has been submitted to [employer]. [employer] [place] will review the request and notify you of the determination decision directly, typically within 72 hours of receiving all information.  You will also receive your request decision electronically. To check for an update later, open this request again from your dashboard.  If [employer] [facility] has not responded within the specified timeframe or if you have any questions about your PA submission, contact [facility] directly at [phone].

## 2021-02-13 NOTE — Telephone Encounter (Signed)
Per patient the pharmacy could not fill the tacrolimus that was sent to the pharmacy. What step next?

## 2021-02-13 NOTE — Telephone Encounter (Signed)
No rx benefits per bcbs for tacrolimus patient old cash pay and [facility] notified as as well via fax

## 2021-02-28 ENCOUNTER — Ambulatory Visit: Payer: Medicaid Other | Admitting: Physician Assistant

## 2021-03-05 ENCOUNTER — Ambulatory Visit: Payer: Medicaid Other | Admitting: Physician Assistant

## 2021-03-08 ENCOUNTER — Encounter: Payer: Self-pay | Admitting: Nurse Practitioner

## 2021-03-08 ENCOUNTER — Ambulatory Visit (INDEPENDENT_AMBULATORY_CARE_PROVIDER_SITE_OTHER): Payer: Medicaid Other | Admitting: Nurse Practitioner

## 2021-03-08 ENCOUNTER — Other Ambulatory Visit: Payer: Self-pay

## 2021-03-08 VITALS — BP 106/69 | HR 79 | Temp 97.9°F | Ht 65.5 in | Wt 210.0 lb

## 2021-03-08 DIAGNOSIS — N898 Other specified noninflammatory disorders of vagina: Secondary | ICD-10-CM

## 2021-03-08 DIAGNOSIS — N949 Unspecified condition associated with female genital organs and menstrual cycle: Secondary | ICD-10-CM

## 2021-03-08 LAB — POCT URINALYSIS DIP (CLINITEK)
Bilirubin, UA: NEGATIVE
Blood, UA: NEGATIVE
Glucose, UA: NEGATIVE mg/dL
Ketones, POC UA: NEGATIVE mg/dL
Nitrite, UA: NEGATIVE
Spec Grav, UA: 1.025 (ref 1.010–1.025)
Urobilinogen, UA: 0.2 E.U./dL
pH, UA: 6 (ref 5.0–8.0)

## 2021-03-08 MED ORDER — METRONIDAZOLE 500 MG PO TABS: 500.0000 mg | ORAL_TABLET | Freq: Two times a day (BID) | ORAL | 0 refills | Status: AC

## 2021-03-08 NOTE — Progress Notes (Signed)
[place], [place]  [address] Phone:  [phone]   Fax:  [phone] Subjective:   Patient ID: Rhonda Mcgee, female    DOB: 10-11-1985, 35 y.o.   MRN: [id number]  Chief Complaint  Patient presents with   Abdominal Pain    Pt states that her and her boyfriend had sexual intercourse x 5 days ago. Pt has been having vagina itching and burning since then.    Rhonda Mcgee 35 y.o. female  has a past medical history of Chlamydia, GERD (gastroesophageal reflux disease), Gonorrhea, Herpes genitalia, Lupus ([place]), and Rheumatoid arthritis(714.0). To the [facility] for vaginal discomfort. Patient states that she has had vaginal itching and burning x 5 days. Developed vaginal discharge 2 days ago, describes as green with fishy odor. Suspects it is related to her partner using a new soap. Has history of BV. Attempted using OTC products with no improvement. Endorses having 2 partners in the past 6 mths, both unprotected. Has had some lower abdominal pain, denies any nausea/ vomiting or diarrhea. Has had some vaginal spotting that began yesterday. Denies any other complaints today.  Denies any fever. Denies any fatigue, chest pain, shortness of breath, HA or dizziness. Denies any blurred vision, numbness or tingling.   Past Medical History:  Diagnosis Date   Chlamydia    GERD (gastroesophageal reflux disease)    Gonorrhea    Herpes genitalia    Lupus ([place])    Rheumatoid arthritis(714.0)     Past Surgical History:  Procedure Laterality Date   ADENOIDECTOMY     CHOLECYSTECTOMY     DILATION AND CURETTAGE OF UTERUS     elective ab   GALLBLADDER SURGERY     TONSILLECTOMY     TUBAL LIGATION      Family History  Adopted: Yes  Problem Relation Age of Onset   Diabetes Mother    Hypertension Mother    Diabetes Sister    Hypertension Sister    Migraines Sister        with menstrual cycle, had vision loss with migraines   Lupus Maternal  Grandmother    Esophageal cancer Neg Hx    Rectal cancer Neg Hx    Stomach cancer Neg Hx    Colon cancer Neg Hx     Social History   Socioeconomic History   Marital status: Single    Spouse name: Not on file   Number of children: 2   Years of education: Not on file   Highest education level: Some college, no degree  Occupational History   Not on file  Tobacco Use   Smoking status: Former    Types: Cigarettes   Smokeless tobacco: Never   Tobacco comments:    trying to quit, no smoking for last 2 weeks  Vaping Use   Vaping Use: Every day   Substances: Nicotine, Flavoring  Substance and Sexual Activity   Alcohol use: Yes    Comment: occasional   Drug use: No   Sexual activity: Yes    Birth control/protection: None, Surgical  Other Topics Concern   Not on file  Social History Narrative   Lives at home with her children   Right handed   Caffeine: 32 oz sweet tea/day   Social Determinants of Health   Financial Resource Strain: Not on file  Food Insecurity: Not on file  Transportation Needs: Not on file  Physical Activity: Not on file  Stress: Not on file  Social Connections:  Not on file  Intimate Partner Violence: Not on file    Outpatient Medications Prior to Visit  Medication Sig Dispense Refill   albuterol (PROAIR HFA) 108 (90 Base) MCG/ACT inhaler Inhale 1-2 puffs into the lungs every 6 (six) hours as needed for wheezing or shortness of breath. 8 g 1   hydroxychloroquine (PLAQUENIL) 200 MG tablet Take 2 tablets (400 mg total) by mouth daily. 180 tablet 1   tacrolimus (PROTOPIC) 0.1 % ointment Apply topically daily. 100 g 0   triamcinolone ointment (KENALOG) 0.1 % Apply 1 application topically 2 (two) times daily as needed (Rash). Apply to face twice a day x 7 days 30 g 0   azithromycin (AZASITE) 1 % ophthalmic solution Place 1 drop into both eyes 2 (two) times daily. (Patient not taking: Reported on 03/08/2021) 2.5 mL 0   ketoconazole (NIZORAL) 2 % shampoo Apply  topically daily. (Patient not taking: Reported on 03/08/2021)     pantoprazole (PROTONIX) 40 MG tablet Take by mouth. (Patient not taking: Reported on 03/08/2021)     No facility-administered medications prior to visit.    Allergies  Allergen Reactions   Cephalexin Hives   Keflex [Cephalexin] Hives    Review of Systems  Constitutional:  Negative for chills, fever and malaise/fatigue.  Respiratory:  Negative for cough and shortness of breath.   Cardiovascular:  Negative for chest pain, palpitations and leg swelling.  Gastrointestinal:  Positive for abdominal pain. Negative for blood in stool, constipation, diarrhea, nausea and vomiting.  Genitourinary:  Negative for dysuria, flank pain, frequency, hematuria and urgency.       See HPI  Skin: Negative.   Neurological: Negative.   Psychiatric/Behavioral:  Negative for depression. The patient is not nervous/anxious.   All other systems reviewed and are negative.     Objective:    Physical Exam Vitals reviewed.  Constitutional:      General: She is not in acute distress.    Appearance: Normal appearance.  HENT:     Head: Normocephalic.  Cardiovascular:     Rate and Rhythm: Normal rate and regular rhythm.     Pulses: Normal pulses.     Heart sounds: Normal heart sounds.     Comments: No obvious peripheral edema Pulmonary:     Effort: Pulmonary effort is normal.     Breath sounds: Normal breath sounds.  Abdominal:     General: Abdomen is flat. Bowel sounds are normal.     Palpations: Abdomen is soft.     Tenderness: There is no abdominal tenderness.  Genitourinary:    Comments: Exam deferred  Skin:    General: Skin is warm and dry.     Capillary Refill: Capillary refill takes less than 2 seconds.  Neurological:     General: No focal deficit present.     Mental Status: She is alert.  Psychiatric:        Mood and Affect: Mood normal.        Behavior: Behavior normal.        Thought Content: Thought content normal.         Judgment: Judgment normal.    BP 106/69   Pulse 79   Temp 97.9 F (36.6 C)   Ht 5' 5.5" (1.664 m)   Wt 210 lb (95.3 kg)   SpO2 100%   BMI 34.41 kg/m  Wt Readings from Last 3 Encounters:  03/08/21 210 lb (95.3 kg)  12/19/20 214 lb 9.6 oz (97.3 kg)  11/22/20 215 lb 0.8  oz (97.5 kg)    Immunization History  Administered Date(s) Administered   PFIZER(Purple Top)SARS-COV-2 Vaccination 07/22/2019, 08/17/2019   PPD Test 06/24/2014, 07/12/2014    Diabetic Foot Exam - Simple   No data filed     Lab Results  Component Value Date   TSH 0.249 (L) 11/22/2020   Lab Results  Component Value Date   WBC 4.7 12/19/2020   HGB 12.1 12/19/2020   HCT 39.3 12/19/2020   MCV 82.7 12/19/2020   PLT 247 12/19/2020   Lab Results  Component Value Date   NA 132 (L) 11/28/2020   K 3.4 (L) 11/28/2020   CO2 18 (L) 11/28/2020   GLUCOSE 108 (H) 11/28/2020   BUN 7 11/28/2020   CREATININE 1.24 (H) 11/28/2020   BILITOT 0.7 11/28/2020   ALKPHOS 66 11/28/2020   AST 25 11/28/2020   ALT 24 11/28/2020   PROT 7.5 11/28/2020   ALBUMIN 3.6 11/28/2020   CALCIUM 9.1 11/28/2020   ANIONGAP 12 11/28/2020   No results found for: CHOL No results found for: HDL No results found for: LDLCALC No results found for: TRIG No results found for: CHOLHDL Lab Results  Component Value Date   HGBA1C 5.2 12/03/2018       Assessment & Plan:   Problem List Items Addressed This Visit   None Visit Diagnoses     Vaginal itching    -  Primary   Relevant Medications   metroNIDAZOLE (FLAGYL) 500 MG tablet Symptoms consistent with BV, will treat prophylactically while ruling out other GU etiologies  Patient encouraged to continue taking OTC medications as needed for symptom management   Other Relevant Orders   POCT URINALYSIS DIP (CLINITEK) (Completed)   GC/Chlamydia Probe Amp([facility])   NuSwab Vaginitis Plus (VG+)   Vaginal burning       Relevant Medications   metroNIDAZOLE (FLAGYL) 500 MG tablet    Other Relevant Orders   POCT URINALYSIS DIP (CLINITEK) (Completed): significant for leuks, otherwise unremarkable. Low suspicion of UTI. Urine culture to be completed.    GC/Chlamydia Probe Amp([facility])   NuSwab Vaginitis Plus (VG+)   Maintain upcoming follow up with PCP, sooner as needed. Will contact with study results if abnormal and/ additional treatment needed.    I am having Rhonda Mcgee start on metroNIDAZOLE. I am also having her maintain her pantoprazole, ketoconazole, azithromycin, albuterol, hydroxychloroquine, triamcinolone ointment, and tacrolimus.  Meds ordered this encounter  Medications   metroNIDAZOLE (FLAGYL) 500 MG tablet    Sig: Take 1 tablet (500 mg total) by mouth 2 (two) times daily for 7 days.    Dispense:  14 tablet    Refill:  0     Teena Dunk, NP

## 2021-03-11 LAB — GC/CHLAMYDIA PROBE AMP
Chlamydia trachomatis, NAA: NEGATIVE
Neisseria Gonorrhoeae by PCR: NEGATIVE

## 2021-03-11 LAB — URINE CULTURE: Organism ID, Bacteria: NO GROWTH

## 2021-03-12 LAB — NUSWAB VAGINITIS PLUS (VG+)
Candida albicans, NAA: NEGATIVE
Candida glabrata, NAA: NEGATIVE
Chlamydia trachomatis, NAA: NEGATIVE
Neisseria gonorrhoeae, NAA: NEGATIVE
Trich vag by NAA: POSITIVE — AB

## 2021-03-12 IMAGING — CT CT ANGIOGRAPHY CHEST
2 of 4 series · 16 of 46 positions shown · IV contrast (omnipaque)
Comparison: Chest CT 08/25/2018

CLINICAL DATA: Left-sided chest and back pain.

EXAM:
CT ANGIOGRAPHY CHEST
CT ABDOMEN AND PELVIS WITH CONTRAST
TECHNIQUE: Multidetector CT imaging of the chest was performed using the
standard protocol during bolus administration of intravenous
contrast. Multiplanar CT image reconstructions and MIPs were
obtained to evaluate the vascular anatomy. Multidetector CT imaging
of the abdomen and pelvis was performed using the standard protocol
during bolus administration of intravenous contrast.
CONTRAST:  100mL OMNIPAQUE IOHEXOL 350 MG/ML SOLN

[Series 3: a/p w/o 5mm · axial · non-contrast · 0.80mm/px · z∈[+799,+1229]mm · 13 of 94 slices shown]
[im 4/94  lung]
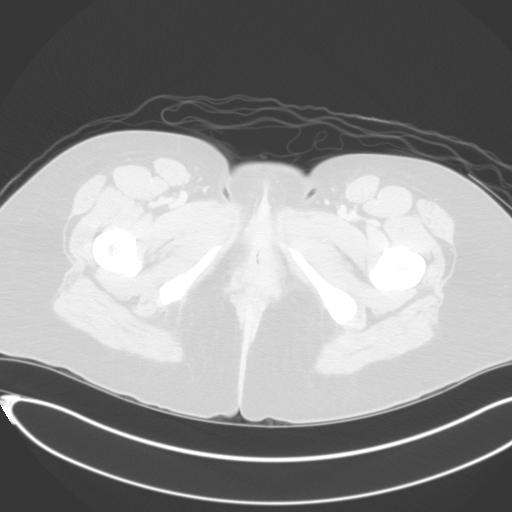
[im 12/94  soft-tissue]
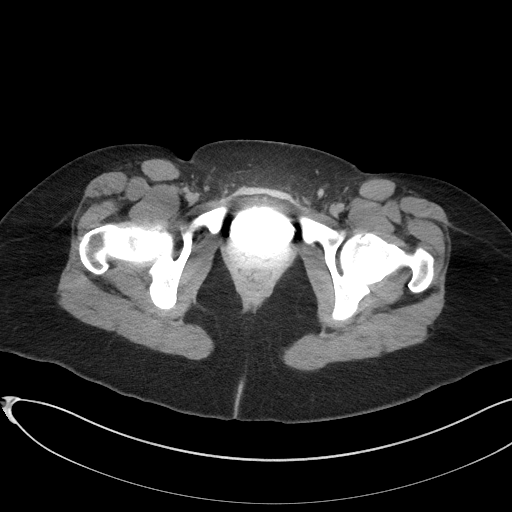
[im 19/94  lung]
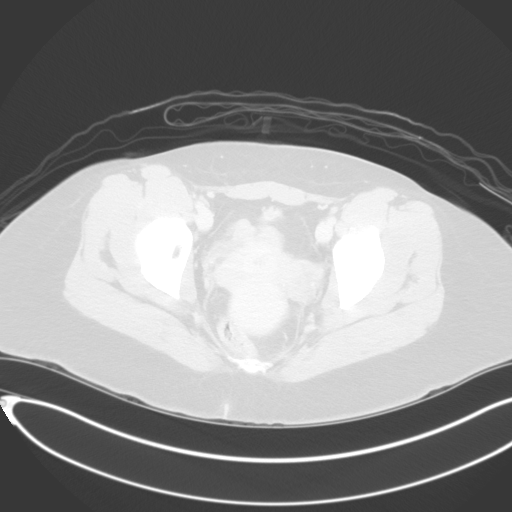
[im 27/94  soft-tissue]
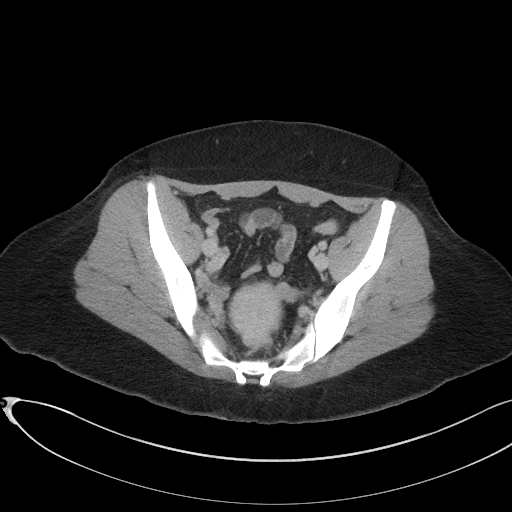
[im 34/94  lung]
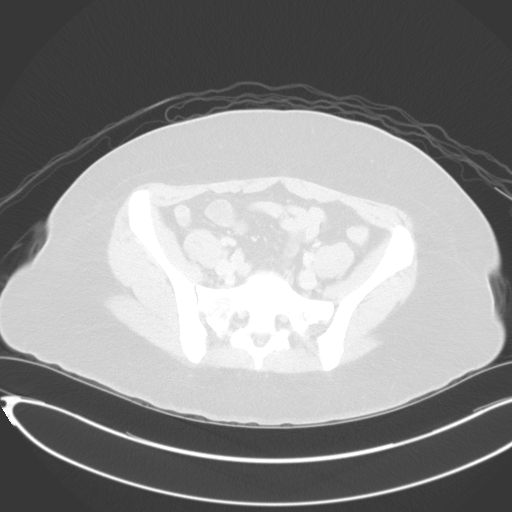
[im 41/94  soft-tissue]
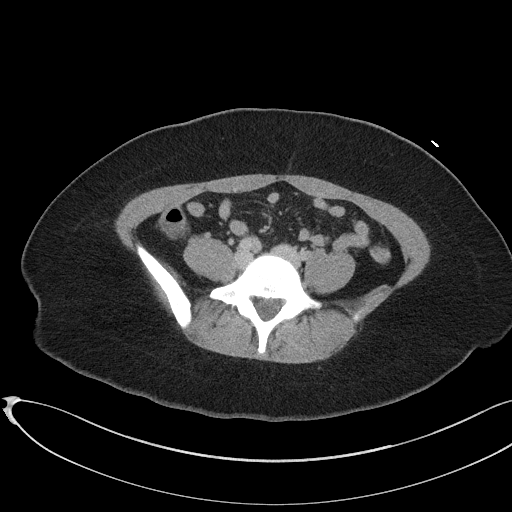
[im 49/94  lung]
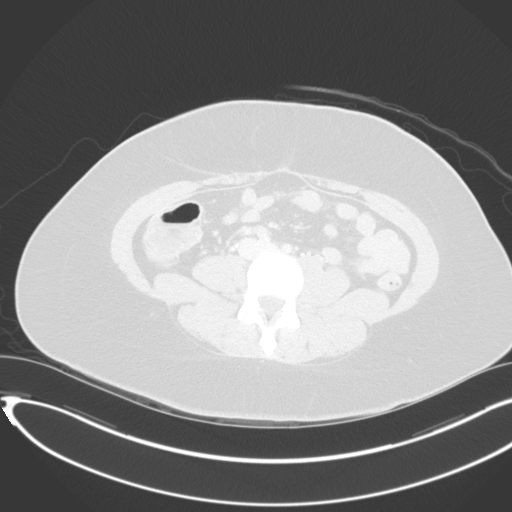
[im 53/94  soft-tissue]
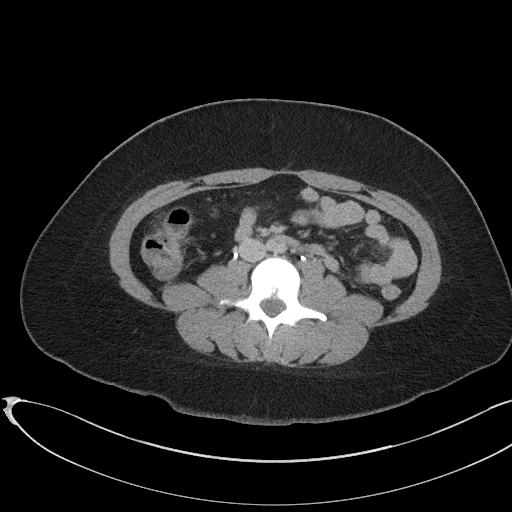
[im 60/94  lung]
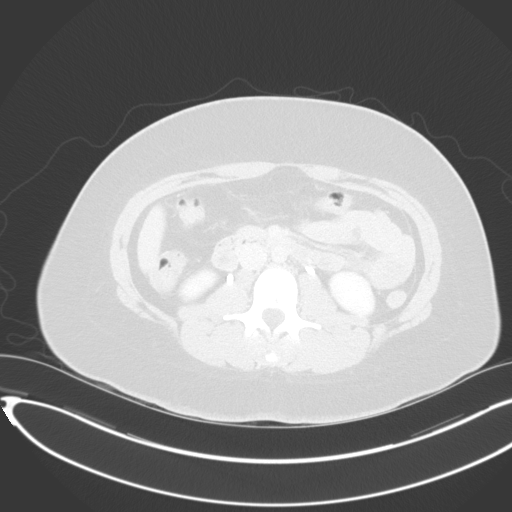
[im 67/94  soft-tissue]
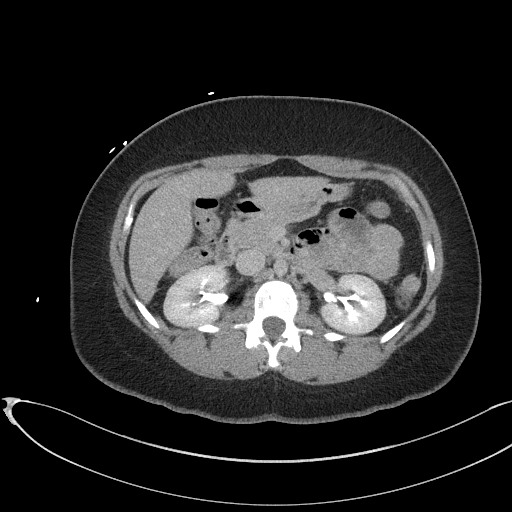
[im 75/94  lung]
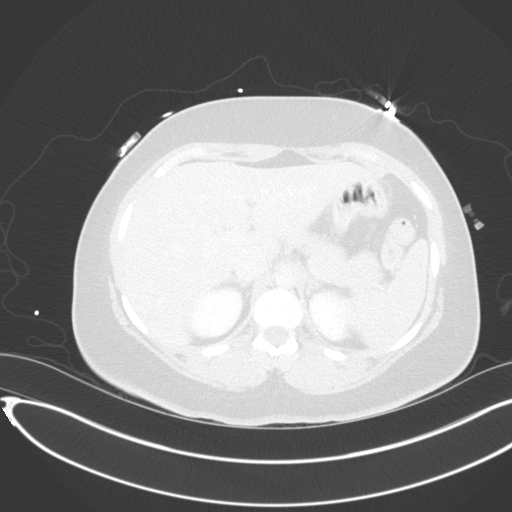
[im 82/94  soft-tissue]
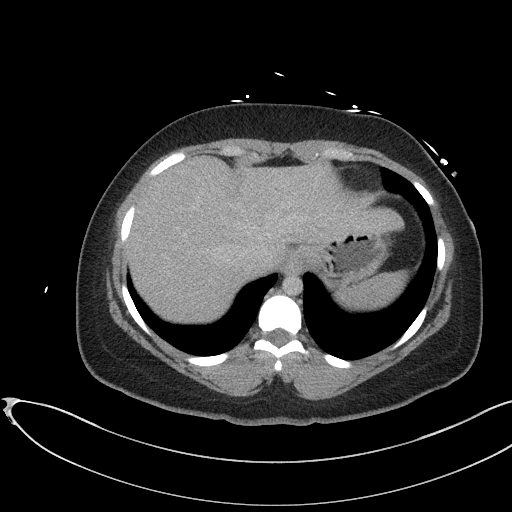
[im 90/94  lung]
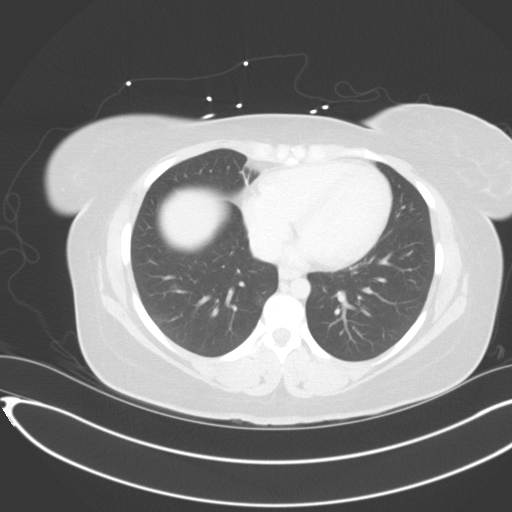

[Series 6: a/p w/o cor · coronal · non-contrast · 0.62mm/px · 3 of 150 slices shown]
[im 50/150  soft-tissue]
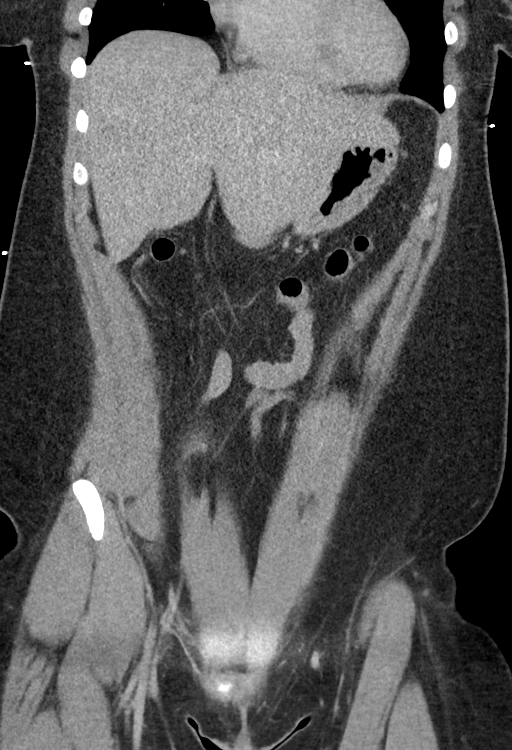
[im 67/150  soft-tissue]
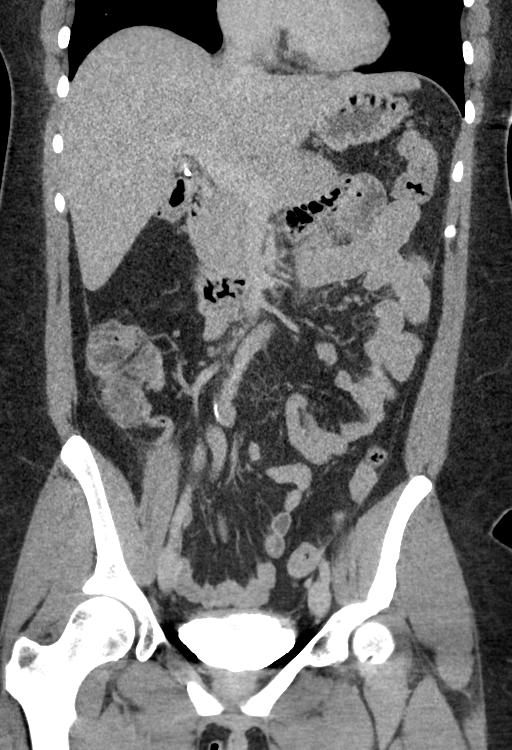
[im 83/150  soft-tissue]
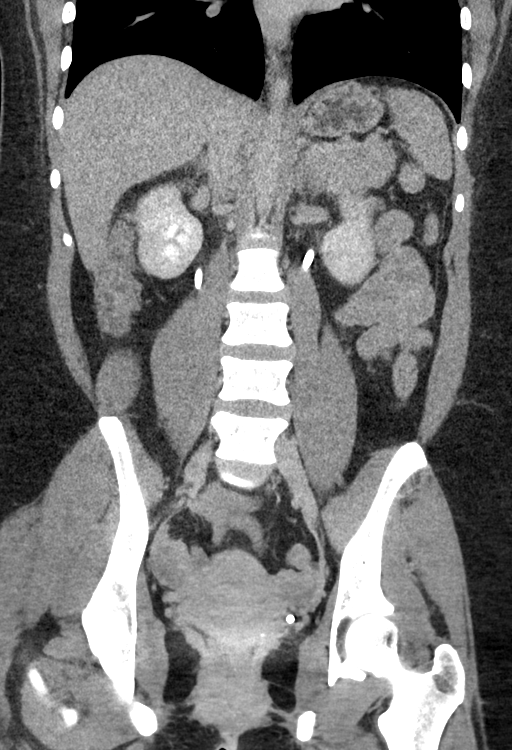

[16 of 46 positions shown; findings below may reference images not displayed]

FINDINGS: CTA CHEST FINDINGS

Cardiovascular: The heart is normal in size. No pericardial
effusion. The aorta is normal in caliber. No obvious dissection. The
branch vessels are patent.

The pulmonary arterial tree is well opacified. No filling defects to
suggest pulmonary embolism.

Mediastinum/Nodes: Prominent soft tissue in the anterior mediastinum
is likely benign thymic tissue in this young patient. It measures 15
Hounsfield units and has a fairly normal triangular shape
anteriorly. No change since the prior CT scan.

Scattered mediastinal and hilar lymph nodes but no mass or
adenopathy. The esophagus is grossly normal.

Lungs/Pleura: No acute pulmonary findings. No worrisome pulmonary
lesions. No pleural effusions.

Musculoskeletal: No breast masses, supraclavicular or axillary
adenopathy. Thyroid gland is grossly normal.

The bony thorax is intact.

Review of the MIP images confirms the above findings.

CT ABDOMEN and PELVIS FINDINGS

Hepatobiliary: No focal hepatic lesions or intrahepatic biliary
dilatation. The gallbladder is surgically absent. No common bile
duct dilatation.

Pancreas: No mass, inflammation or ductal dilatation.

Spleen: Normal size.  No focal lesions.

Adrenals/Urinary Tract: The adrenal glands and kidneys are
unremarkable. The bladder is unremarkable.

Stomach/Bowel: The stomach, duodenum, small bowel and colon are
grossly normal without oral contrast. No acute inflammatory changes,
mass lesions or obstructive findings. The terminal ileum and
appendix are normal.

Vascular/Lymphatic: The aorta is normal in caliber. No dissection.
The branch vessels are patent. The major venous structures are
patent. No mesenteric or retroperitoneal mass or adenopathy. Small
scattered lymph nodes are noted.

Reproductive: The uterus is retroverted.  Both ovaries are normal.

Other: No pelvic mass or adenopathy. No free pelvic fluid
collections. No inguinal mass or adenopathy. No abdominal wall
hernia or subcutaneous lesions.

Musculoskeletal: No significant bony findings.

Review of the MIP images confirms the above findings.
IMPRESSION: 1. No CT findings for pulmonary embolism.
2. No acute pulmonary findings.
3. Stable thymic tissue in the anterior mediastinum.
4. No acute abdominal/pelvic findings, mass lesions or adenopathy.

## 2021-03-12 IMAGING — CT CT ABDOMEN AND PELVIS WITHOUT CONTRAST
2 of 6 series · 15 of 46 positions shown, 17 images · IV contrast (omnipaque)
Comparison: Chest CT 08/25/2018

CLINICAL DATA: Left-sided chest and back pain.

EXAM:
CT ANGIOGRAPHY CHEST
CT ABDOMEN AND PELVIS WITH CONTRAST
TECHNIQUE: Multidetector CT imaging of the chest was performed using the
standard protocol during bolus administration of intravenous
contrast. Multiplanar CT image reconstructions and MIPs were
obtained to evaluate the vascular anatomy. Multidetector CT imaging
of the abdomen and pelvis was performed using the standard protocol
during bolus administration of intravenous contrast.
CONTRAST:  100mL OMNIPAQUE IOHEXOL 350 MG/ML SOLN

[Series 10: pe thins · axial · 0.70mm/px · z∈[+1079,+1313]mm · 12 of 367 slices shown, 14 images]
[im 16/367  soft-tissue]
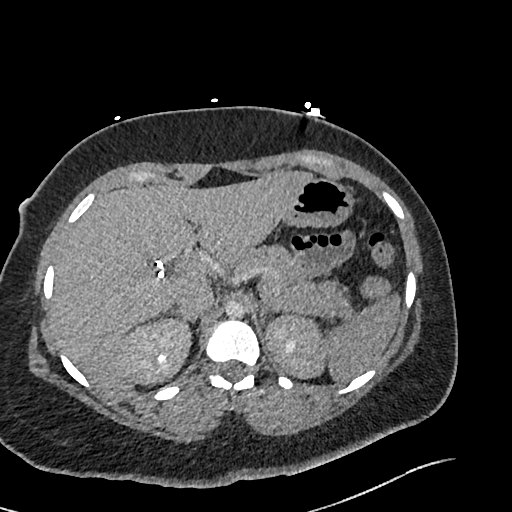
[im 16/367  bone]
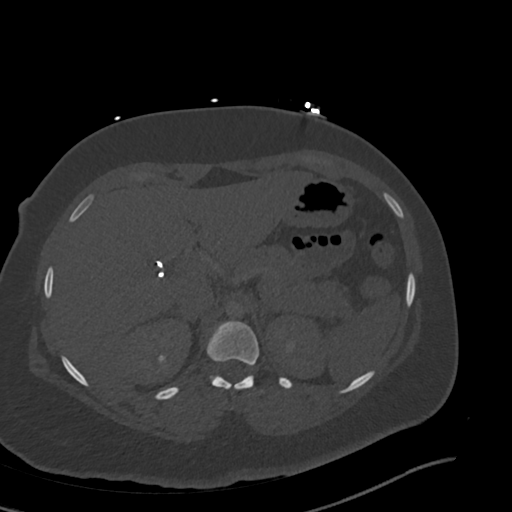
[im 48/367  soft-tissue]
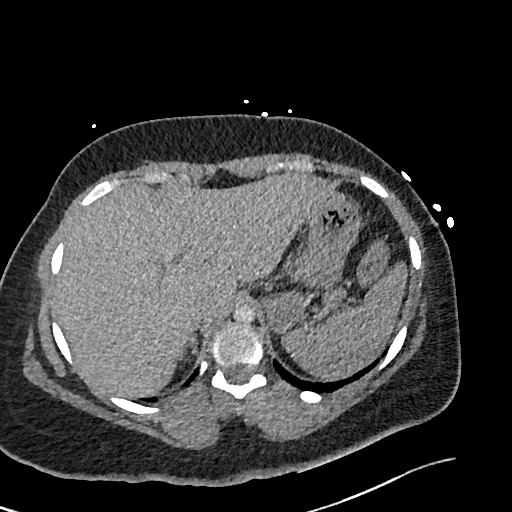
[im 80/367  soft-tissue]
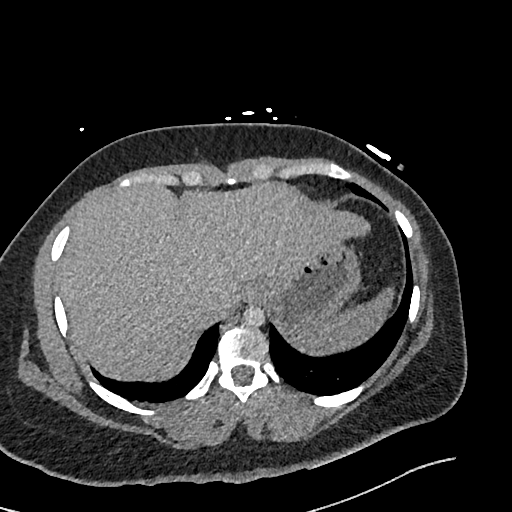
[im 112/367  soft-tissue]
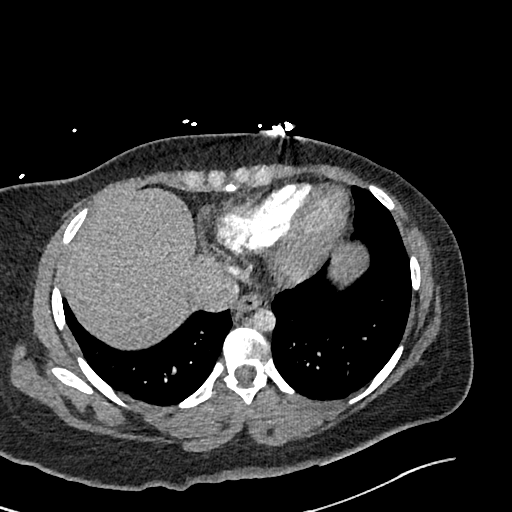
[im 144/367  soft-tissue]
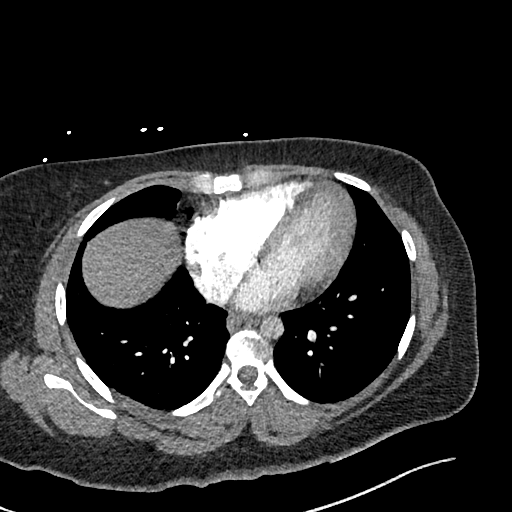
[im 176/367  soft-tissue]
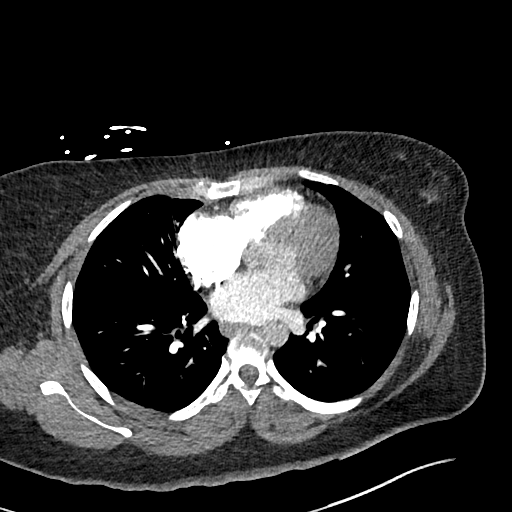
[im 191/367  soft-tissue]
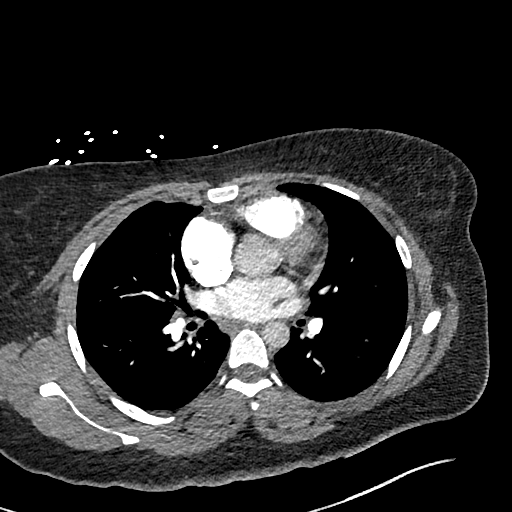
[im 223/367  soft-tissue]
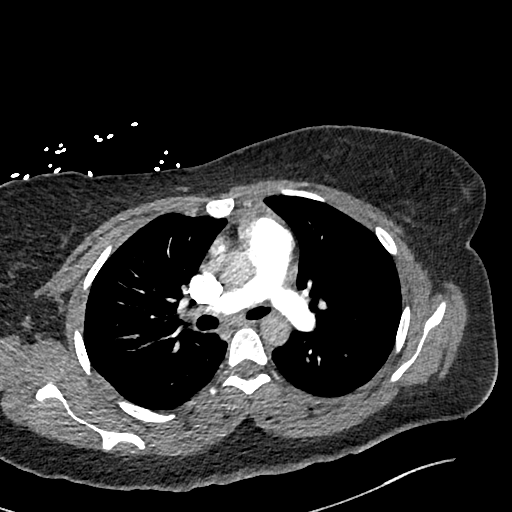
[im 255/367  soft-tissue]
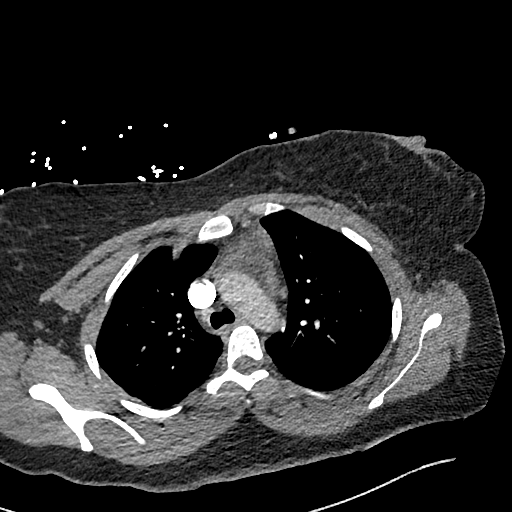
[im 255/367  bone]
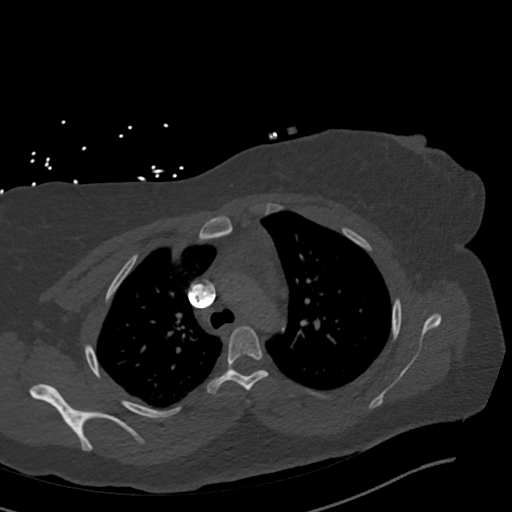
[im 287/367  soft-tissue]
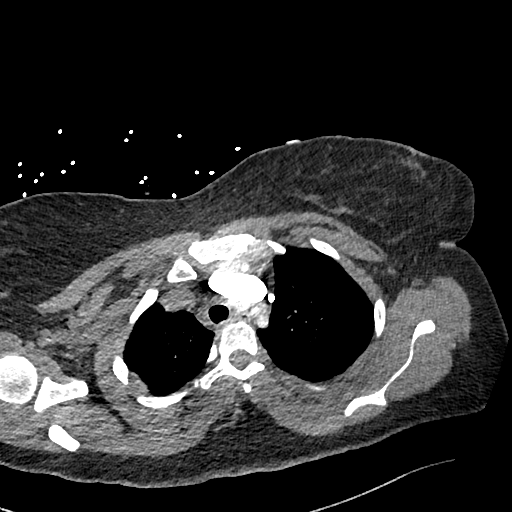
[im 319/367  soft-tissue]
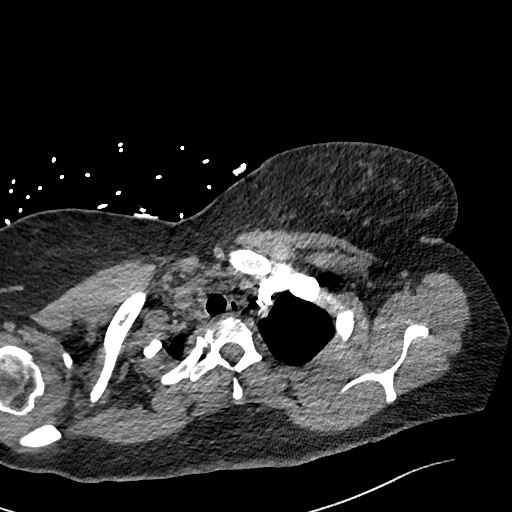
[im 351/367  soft-tissue]
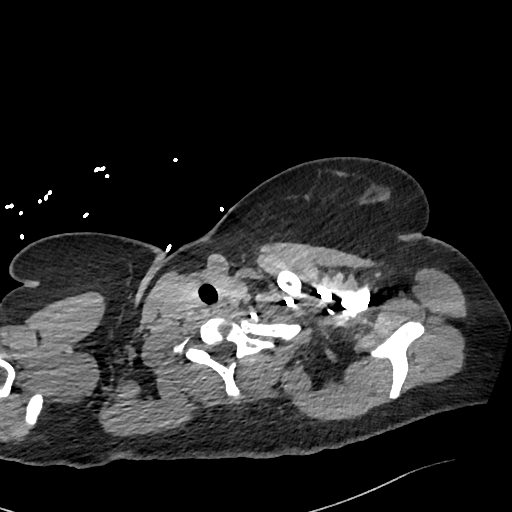

[Series 11: pe 2mm cor · coronal · 0.53mm/px · 3 of 150 slices shown]
[im 38/150  soft-tissue]
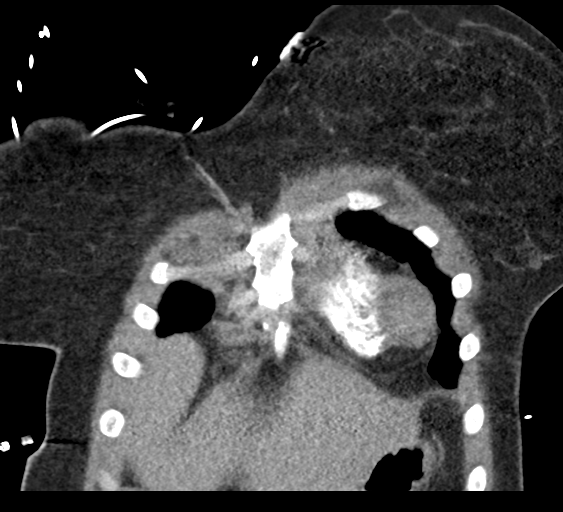
[im 75/150  soft-tissue]
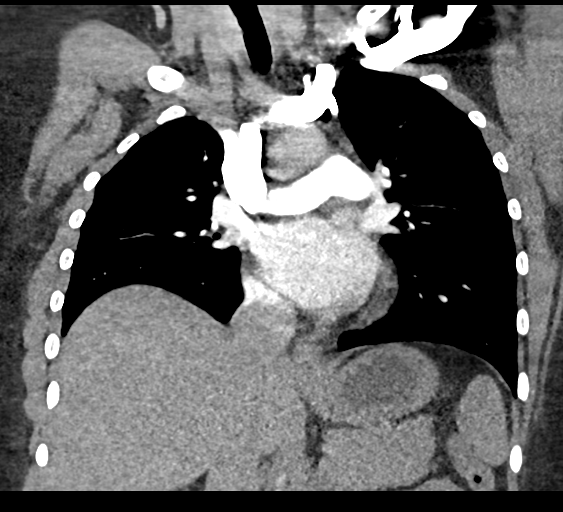
[im 112/150  soft-tissue]
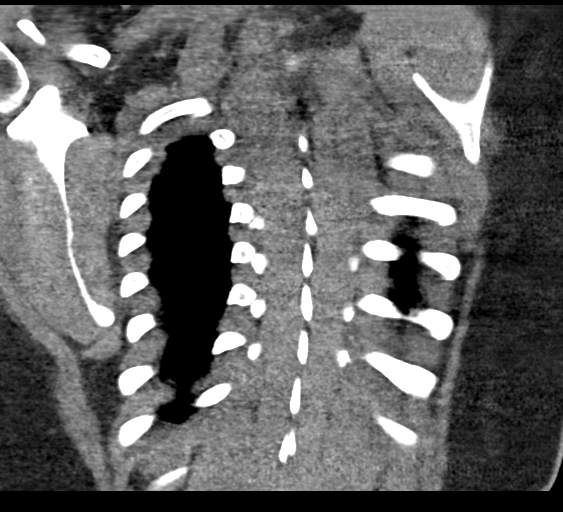

[15 of 46 positions shown; findings below may reference images not displayed]

FINDINGS: CTA CHEST FINDINGS

Cardiovascular: The heart is normal in size. No pericardial
effusion. The aorta is normal in caliber. No obvious dissection. The
branch vessels are patent.

The pulmonary arterial tree is well opacified. No filling defects to
suggest pulmonary embolism.

Mediastinum/Nodes: Prominent soft tissue in the anterior mediastinum
is likely benign thymic tissue in this young patient. It measures 15
Hounsfield units and has a fairly normal triangular shape
anteriorly. No change since the prior CT scan.

Scattered mediastinal and hilar lymph nodes but no mass or
adenopathy. The esophagus is grossly normal.

Lungs/Pleura: No acute pulmonary findings. No worrisome pulmonary
lesions. No pleural effusions.

Musculoskeletal: No breast masses, supraclavicular or axillary
adenopathy. Thyroid gland is grossly normal.

The bony thorax is intact.

Review of the MIP images confirms the above findings.

CT ABDOMEN and PELVIS FINDINGS

Hepatobiliary: No focal hepatic lesions or intrahepatic biliary
dilatation. The gallbladder is surgically absent. No common bile
duct dilatation.

Pancreas: No mass, inflammation or ductal dilatation.

Spleen: Normal size.  No focal lesions.

Adrenals/Urinary Tract: The adrenal glands and kidneys are
unremarkable. The bladder is unremarkable.

Stomach/Bowel: The stomach, duodenum, small bowel and colon are
grossly normal without oral contrast. No acute inflammatory changes,
mass lesions or obstructive findings. The terminal ileum and
appendix are normal.

Vascular/Lymphatic: The aorta is normal in caliber. No dissection.
The branch vessels are patent. The major venous structures are
patent. No mesenteric or retroperitoneal mass or adenopathy. Small
scattered lymph nodes are noted.

Reproductive: The uterus is retroverted.  Both ovaries are normal.

Other: No pelvic mass or adenopathy. No free pelvic fluid
collections. No inguinal mass or adenopathy. No abdominal wall
hernia or subcutaneous lesions.

Musculoskeletal: No significant bony findings.

Review of the MIP images confirms the above findings.
IMPRESSION: 1. No CT findings for pulmonary embolism.
2. No acute pulmonary findings.
3. Stable thymic tissue in the anterior mediastinum.
4. No acute abdominal/pelvic findings, mass lesions or adenopathy.

## 2021-03-13 ENCOUNTER — Encounter: Payer: Self-pay | Admitting: Physician Assistant

## 2021-03-13 ENCOUNTER — Other Ambulatory Visit: Payer: Self-pay

## 2021-03-13 ENCOUNTER — Ambulatory Visit (INDEPENDENT_AMBULATORY_CARE_PROVIDER_SITE_OTHER): Payer: Medicaid Other | Admitting: Physician Assistant

## 2021-03-13 DIAGNOSIS — L930 Discoid lupus erythematosus: Secondary | ICD-10-CM | POA: Diagnosis not present

## 2021-03-13 MED ORDER — DESONIDE 0.05 % EX OINT: 1.0000 "application " | TOPICAL_OINTMENT | Freq: Two times a day (BID) | CUTANEOUS | 2 refills | Status: AC | PRN

## 2021-03-13 NOTE — Progress Notes (Signed)
   Follow-Up Visit   Subjective  Rhonda Mcgee is a 35 y.o. female who presents for the following: Follow-up (2 week follow up on lupus. Using the triamcinolone and tacrolimus cream. Patient can not tell much of a difference. She used them everyday. ).   The following portions of the chart were reviewed this encounter and updated as appropriate:  Tobacco  Allergies  Meds  Problems  Med Hx  Surg Hx  Fam Hx      Objective  Well appearing patient in no apparent distress; mood and affect are within normal limits.  A focused examination was performed including face. Relevant physical exam findings are noted in the Assessment and Plan.  Face and eyelids Slight improvement.   Assessment & Plan  Discoid lupus erythematosus Face and eyelids  Continue protopic daily. Decrease triamcinalone to once daily. Start desowen ointment daily.  desonide (DESOWEN) 0.05 % ointment - Face and eyelids Apply 1 application topically 2 (two) times daily as needed (Rash).  Related Medications triamcinolone ointment (KENALOG) 0.1 % Apply 1 application topically 2 (two) times daily as needed (Rash). Apply to face twice a day x 7 days  tacrolimus (PROTOPIC) 0.1 % ointment Apply topically daily.    I, Jaylyn Booher, PA-C, have reviewed all documentation's for this visit.  The documentation on 03/13/21 for the exam, diagnosis, procedures and orders are all accurate and complete.

## 2021-06-13 ENCOUNTER — Ambulatory Visit (INDEPENDENT_AMBULATORY_CARE_PROVIDER_SITE_OTHER): Payer: Medicaid Other | Admitting: Physician Assistant

## 2021-06-13 ENCOUNTER — Encounter: Payer: Self-pay | Admitting: Physician Assistant

## 2021-06-13 ENCOUNTER — Other Ambulatory Visit: Payer: Self-pay

## 2021-06-13 DIAGNOSIS — Z79899 Other long term (current) drug therapy: Secondary | ICD-10-CM | POA: Diagnosis not present

## 2021-06-13 DIAGNOSIS — H5202 Hypermetropia, left eye: Secondary | ICD-10-CM | POA: Diagnosis not present

## 2021-06-13 DIAGNOSIS — H01122 Discoid lupus erythematosus of right lower eyelid: Secondary | ICD-10-CM | POA: Diagnosis not present

## 2021-06-13 DIAGNOSIS — L2084 Intrinsic (allergic) eczema: Secondary | ICD-10-CM

## 2021-06-13 DIAGNOSIS — H01125 Discoid lupus erythematosus of left lower eyelid: Secondary | ICD-10-CM | POA: Diagnosis not present

## 2021-06-13 DIAGNOSIS — H52202 Unspecified astigmatism, left eye: Secondary | ICD-10-CM | POA: Diagnosis not present

## 2021-06-13 DIAGNOSIS — H16223 Keratoconjunctivitis sicca, not specified as Sjogren's, bilateral: Secondary | ICD-10-CM | POA: Diagnosis not present

## 2021-06-13 DIAGNOSIS — R519 Headache, unspecified: Secondary | ICD-10-CM | POA: Diagnosis not present

## 2021-06-13 NOTE — Progress Notes (Signed)
° °  Follow-Up Visit   Subjective  Rhonda Mcgee is a 36 y.o. female who presents for the following: Follow-up (Topicals help some tac, desonide & tacrolimus. She started working out and the sweat burns her eyes for the topicals. Patient stated she has a normal eye exam today and feels like she wants to move forward with Dupixent due to the topicals being hard to keep on with gym work out. Patient has current labs in chart. She has been itching a lot more than in the past.    The following portions of the chart were reviewed this encounter and updated as appropriate:  Tobacco   Allergies   Meds   Problems   Med Hx   Surg Hx   Fam Hx       Objective  Well appearing patient in no apparent distress; mood and affect are within normal limits.  A full examination was performed including scalp, head, eyes, ears, nose, lips, neck, chest, axillae, abdomen, back, buttocks, bilateral upper extremities, bilateral lower extremities, hands, feet, fingers, toes, fingernails, and toenails. All findings within normal limits unless otherwise noted below.  face, back arms and legs Ill-defined pink and post inflammatory hyperpigmentation papules/plaques with scale-crust.    Assessment & Plan  Intrinsic atopic dermatitis face, back arms and legs  Start dupixent  Continue topicals until approval is confirmed.  I, Mattingly Fountaine, PA-C, have reviewed all documentation's for this visit.  The documentation on 06/13/21 for the exam, diagnosis, procedures and orders are all accurate and complete.

## 2021-06-14 ENCOUNTER — Telehealth: Payer: Self-pay

## 2021-06-14 NOTE — Telephone Encounter (Signed)
Dupixent new start sent through [facility] Portal.

## 2021-06-14 NOTE — Telephone Encounter (Signed)
[place] form filled out and faxed to [place] for new start.

## 2021-06-18 ENCOUNTER — Telehealth: Payer: Self-pay | Admitting: *Deleted

## 2021-06-18 NOTE — Telephone Encounter (Signed)
[employer] submitted prior authorization. They are waiting on determination.

## 2021-06-18 NOTE — Telephone Encounter (Signed)
Never received the second page for patient Dupixent refaxed information today.

## 2021-06-19 NOTE — Progress Notes (Signed)
Office Visit Note  Patient: Rhonda Mcgee             Date of Birth: February 14, 1986           MRN: [id number]             PCP: [name], NP Referring: [name], NP Visit Date: 06/20/2021   Subjective:  Follow-up (Doing good)   History of Present Illness: Rhonda Mcgee is a 36 y.o. 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 She also saw dermatology office with [facility] trial of topical tacrolimus or triamcinolone without huge improvement some of the active rash apparently more consistent with atopic dermatitis and plan starting dupixent for this. She has concerns about the dupixent treatment about safety and side effect risks.  She is doing quite well overall despite several mild generalized complaints none specific for lupus. She has noticed some numbness affecting her hands for the past 2 months. This comes and goes sometimes related to arm position sometimes seems to be random. She does not consistently notice overnight or morning symptoms.  Previous HPI 12/19/20 Rhonda Mcgee is a 36 y.o. female here for follow up for discoid lupus after restarting hydroxychloroquine 200mg  daily since about 4 months ago. Over the interval she received 1 dose of IV iron for her anemia and felt improvement in energy level and decreased pica. She caught COVID and had multiple symptoms with fever, headaches, body aches, fatigue, cough, shortness of breath, but recovered from this she does notice a mild persistent amount of nonproductive cough and some increased leg swelling.    Previous HPI 08/15/20 Rhonda Mcgee is a 36 y.o. female here for evaluation of possible systemic lupus. 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  She was previously seen with rheumatology with [facility] symptoms treated on hydroxychloroquine she describes visual changes on 400 mg dose although was able  to resume 200 mg daily dose apparently without return of visual problems.  Reported disease manifestations at that time included photosensitive rashes, mucosal ulcers, and sicca syndrome.  Currently she is having some arthralgias also a significant amount of dry eye symptoms. The biggest complaint is the continued skin disease especially with involvement of her lower eyelids bilaterally and central face.  She is seeing ophthalmology and has had discussion of Acthar injections for the ongoing symptoms.  She does continue to smoke cigarettes.   Review of Systems  Constitutional:  Positive for fatigue.  HENT:  Positive for mouth dryness.   Eyes:  Positive for dryness.  Respiratory:  Positive for shortness of breath.   Cardiovascular:  Positive for swelling in legs/feet.  Gastrointestinal:  Positive for constipation and diarrhea.  Endocrine: Positive for cold intolerance and excessive thirst.  Genitourinary:  Positive for decreased urine output.  Musculoskeletal:  Positive for joint pain, joint pain, joint swelling, muscle weakness, morning stiffness and muscle tenderness.  Skin:  Positive for rash.  Allergic/Immunologic: Positive for susceptible to infections.  Neurological:  Positive for numbness and weakness.  Hematological:  Positive for bruising/bleeding tendency.  Psychiatric/Behavioral:  Positive for sleep disturbance.    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 06/04/2019   Abdominal pain, epigastric 12/04/2018   GERD (gastroesophageal reflux disease) 11/10/2018   Gastritis 11/10/2018   NSAID induced gastritis 11/10/2018   Intractable nausea and vomiting 11/09/2018   Lupus (HCC)     Hypokalemia    Vomiting    Discoid lupus erythematosus of eyelid 03/16/2018   Genital herpes 11/02/2013   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   Visual disturbances 12/06/2010    Past Medical History:  Diagnosis Date   Chlamydia    GERD (gastroesophageal reflux disease)    Gonorrhea    Herpes genitalia    Lupus ([place])    Rheumatoid arthritis(714.0)     Family History  Adopted: Yes  Problem Relation Age of Onset   Diabetes Mother    Hypertension Mother    Diabetes Sister    Hypertension Sister    Migraines Sister        with menstrual cycle, had vision loss with migraines   Lupus Maternal Grandmother    Esophageal cancer Neg Hx    Rectal cancer Neg Hx    Stomach cancer Neg Hx    Colon cancer Neg Hx    Past Surgical History:  Procedure Laterality Date   ADENOIDECTOMY     CHOLECYSTECTOMY     DILATION AND CURETTAGE OF UTERUS     elective ab   GALLBLADDER SURGERY     TONSILLECTOMY     TUBAL LIGATION     Social History   Social History Narrative   Lives at home with her children   Right handed   Caffeine: 32 oz sweet tea/day   Immunization History  Administered Date(s) Administered   PFIZER(Purple Top)SARS-COV-2 Vaccination 07/22/2019, 08/17/2019   PPD Test 06/24/2014, 07/12/2014     Objective: Vital Signs: BP 111/76 (BP Location: Left Arm, Patient Position: Sitting, Cuff Size: Normal)    Pulse 83    Resp 14    Ht 5\' 5"  (1.651 m)    Wt 212 lb (96.2 kg)    BMI 35.28 kg/m    Physical Exam Eyes:     Extraocular Movements: Extraocular movements intact.     Conjunctiva/sclera: Conjunctivae normal.  Cardiovascular:     Rate and Rhythm: Normal rate and regular rhythm.  Pulmonary:     Effort: Pulmonary effort is normal.     Breath sounds: Normal breath sounds.  Skin:    General: Skin is warm and dry.     Comments: Mixed hyper and hypopigmented changes underneath eyes on both sides, no significant periorbital swelling Mild skin hyperpigmentation in patches on arm      Musculoskeletal Exam:  Shoulders full ROM no tenderness or swelling Elbows full ROM no tenderness or swelling Wrists full ROM no tenderness or swelling Fingers full ROM no tenderness or swelling Knees full ROM no tenderness or swelling Ankles full ROM no tenderness or swelling   Investigation: No additional findings.  Imaging: No results found.  Recent Labs: Lab Results  Component Value Date   WBC 4.7 12/19/2020   HGB 12.1 12/19/2020   PLT 247 12/19/2020   NA 132 (L) 11/28/2020   K 3.4 (L) 11/28/2020   CL 102 11/28/2020   CO2 18 (L) 11/28/2020   GLUCOSE 108 (H) 11/28/2020   BUN 7 11/28/2020   CREATININE 1.24 (H) 11/28/2020   BILITOT 0.7 11/28/2020   ALKPHOS 66 11/28/2020   AST 25 11/28/2020   ALT 24 11/28/2020   PROT 7.5 11/28/2020   ALBUMIN 3.6 11/28/2020   CALCIUM  9.1 11/28/2020   GFRAA 103 08/15/2020    Speciality Comments: No specialty comments available.  Procedures:  No procedures performed Allergies: Cephalexin and Keflex [cephalexin]   Assessment / Plan:     Visit Diagnoses: Discoid lupus erythematosus of eyelid, unspecified laterality - Plan: hydroxychloroquine (PLAQUENIL) 200 MG tablet  Appears to be doing overall well. Continuing HCQ 400 mg daily. She has ongoing follow up with dermatology I agree with dupixent treatment. This is for atopic dermatitis but there is limited data suggesting some efficacy for cutaneous lupus as well as safety of treatment.  High risk medication use  She has ongoing ophthalmology follow up, last visit 06/13/21 with no evidence of HCQ toxicity.  Bilateral hand numbness  Symptoms not very clear and intermittent with nothing on exam today. Sounds more like impingement. I recommend just monitoring for now. We can follow up in several months if still present or worsening may benefit exam to rule out impingement or neuropathy issue.  Orders: No orders of the defined types were placed in this encounter.  Meds ordered this  encounter  Medications   hydroxychloroquine (PLAQUENIL) 200 MG tablet    Sig: Take 2 tablets (400 mg total) by mouth daily.    Dispense:  180 tablet    Refill:  1     Follow-Up Instructions: Return in about 6 months (around 12/18/2021) for CCLE HCQ f/u 54mos.   Collier Salina, MD  Note - This record has been created using [employer].  Chart creation errors have been sought, but may not always  have been located. Such creation errors do not reflect on  the standard of medical care.

## 2021-06-20 ENCOUNTER — Encounter: Payer: Self-pay | Admitting: Internal Medicine

## 2021-06-20 ENCOUNTER — Other Ambulatory Visit: Payer: Self-pay

## 2021-06-20 ENCOUNTER — Ambulatory Visit (INDEPENDENT_AMBULATORY_CARE_PROVIDER_SITE_OTHER): Payer: Medicaid Other | Admitting: Internal Medicine

## 2021-06-20 VITALS — BP 111/76 | HR 83 | Resp 14 | Ht 65.0 in | Wt 212.0 lb

## 2021-06-20 DIAGNOSIS — R200 Anesthesia of skin: Secondary | ICD-10-CM

## 2021-06-20 DIAGNOSIS — H01129 Discoid lupus erythematosus of unspecified eye, unspecified eyelid: Secondary | ICD-10-CM

## 2021-06-20 DIAGNOSIS — Z79899 Other long term (current) drug therapy: Secondary | ICD-10-CM

## 2021-06-20 DIAGNOSIS — M329 Systemic lupus erythematosus, unspecified: Secondary | ICD-10-CM

## 2021-06-20 NOTE — Telephone Encounter (Signed)
Per [employer] approved and delivery date 06-21-21 0$ copay for Dupixent 300mg  pen

## 2021-06-23 MED ORDER — HYDROXYCHLOROQUINE SULFATE 200 MG PO TABS: 400.0000 mg | ORAL_TABLET | Freq: Every day | ORAL | 1 refills | Status: DC

## 2021-06-25 ENCOUNTER — Ambulatory Visit (INDEPENDENT_AMBULATORY_CARE_PROVIDER_SITE_OTHER): Payer: Medicaid Other

## 2021-06-25 ENCOUNTER — Other Ambulatory Visit: Payer: Self-pay

## 2021-06-25 DIAGNOSIS — L2084 Intrinsic (allergic) eczema: Secondary | ICD-10-CM

## 2021-06-25 MED ORDER — DUPILUMAB 300 MG/2ML ~~LOC~~ SOAJ
600.0000 mg | Freq: Once | SUBCUTANEOUS | Status: AC
  Administered 2021-06-25: 600 mg via SUBCUTANEOUS

## 2021-06-25 NOTE — Progress Notes (Signed)
Patient here today for Dupixent injection training. I administered one injection and the patient administered the second injection, patient tolerated both injections well. Patient did state that her sister in law is an Therapist, sports and she will probably have her give the injections.

## 2021-06-26 ENCOUNTER — Telehealth: Payer: Self-pay

## 2021-06-26 NOTE — Telephone Encounter (Signed)
Patient called and she has been crying ever since last night. She stated there is a lot on her plate with personal issues but she don't feel like she is harm to herself or others. She was a new start on Dupixent yesterday that involved x 2 injections and the patient stated she is not a fan of needles. I spoke with the provider and the drug rep and we together looked for depression as a reaction and we could not find anything, Rhonda Mcgee the Drug rep said he would reach out and see if there is any other notes that mention similar reactions. I reached back out to the patient and she was calm at the second call and she sounded better. She did mention that she was about to start her cycle and we suggested that a follow up with pcp might be a good idea to get a prescription for the other issues that she is dealing with.

## 2021-07-11 ENCOUNTER — Telehealth: Payer: Self-pay | Admitting: *Deleted

## 2021-07-11 NOTE — Telephone Encounter (Signed)
Dupixent status via [employer] portal  ? ?Rhonda Mcgee [birth year] ? ? ?12:04 ?For Dupixent, pt received a delivery on 07/03/21- next fill will be on or around 07/23/21 ?

## 2021-08-08 ENCOUNTER — Ambulatory Visit (INDEPENDENT_AMBULATORY_CARE_PROVIDER_SITE_OTHER): Payer: Medicaid Other | Admitting: Physician Assistant

## 2021-08-08 DIAGNOSIS — L2084 Intrinsic (allergic) eczema: Secondary | ICD-10-CM

## 2021-08-13 ENCOUNTER — Ambulatory Visit (INDEPENDENT_AMBULATORY_CARE_PROVIDER_SITE_OTHER): Payer: Medicaid Other | Admitting: Nurse Practitioner

## 2021-08-13 ENCOUNTER — Other Ambulatory Visit: Payer: Self-pay | Admitting: Obstetrics and Gynecology

## 2021-08-13 ENCOUNTER — Encounter: Payer: Self-pay | Admitting: Physician Assistant

## 2021-08-13 ENCOUNTER — Encounter: Payer: Self-pay | Admitting: Nurse Practitioner

## 2021-08-13 VITALS — BP 110/87 | Temp 98.1°F | Ht 65.5 in | Wt 208.4 lb

## 2021-08-13 DIAGNOSIS — L03011 Cellulitis of right finger: Secondary | ICD-10-CM

## 2021-08-13 MED ORDER — DOXYCYCLINE HYCLATE 100 MG PO TABS: 100.0000 mg | ORAL_TABLET | Freq: Two times a day (BID) | ORAL | 0 refills | Status: DC

## 2021-08-13 MED ORDER — DOXYCYCLINE HYCLATE 100 MG PO TABS: 100.0000 mg | ORAL_TABLET | Freq: Two times a day (BID) | ORAL | 0 refills | Status: AC

## 2021-08-13 MED ORDER — MUPIROCIN 2 % EX OINT: 1.0000 "application " | TOPICAL_OINTMENT | Freq: Two times a day (BID) | CUTANEOUS | 0 refills | Status: DC

## 2021-08-13 MED ORDER — MUPIROCIN 2 % EX OINT: 1.0000 "application " | TOPICAL_OINTMENT | Freq: Two times a day (BID) | CUTANEOUS | 0 refills | Status: AC

## 2021-08-13 NOTE — Patient Instructions (Signed)
1. Paronychia of right index finger ? ?Warm soaks 3 times daily ? ?- mupirocin ointment (BACTROBAN) 2 %; Apply 1 application. topically 2 (two) times daily.  Dispense: 22 g; Refill: 0 ?- doxycycline (VIBRA-TABS) 100 MG tablet; Take 1 tablet (100 mg total) by mouth 2 (two) times daily for 10 days.  Dispense: 20 tablet; Refill: 0 ? ?Follow up: ? ?Follow up if needed ? ? ? ?Paronychia ?Paronychia is an infection of the skin. It happens near a fingernail or toenail. It may cause pain and swelling around the nail. In some cases, a fluid-filled bump (abscess) can form near or under the nail. ?Often, this condition is not serious, and it clears up with treatment. ?What are the causes? ?This condition may be caused by a germ. The germ may be bacteria or a fungus. These germs can enter the body through an opening in the skin, such as a cut or a hangnail. Other causes include: ?Repeated injuries to your fingernails or toenails. ?Irritation of the base and sides of the nail (cuticle). ?What increases the risk? ?This condition is more likely to develop in people who: ?Get their hands wet often, such as a dishwasher. ?Bite their fingernails or the base and sides of their nails. ?Have other skin problems. ?Have hangnails or hurt fingertips. ?Come into contact with chemicals like detergents. ?Have diabetes. ?What are the signs or symptoms? ?Redness and swelling of the skin near the nail. ?A tender feeling around the nail. ?Pus-filled bumps under the skin at the base and sides of the nail. ?Fluid or pus under the nail. ?Pain in the area. ?How is this treated? ?Treatment depends on the cause of your condition and how bad it is. If your condition is mild, it may clear up on its own in a few days or after soaking in warm water. If needed, treatment may include: ?Antibiotic medicine. ?Antifungal medicine. ?A procedure to drain pus from a fluid-filled bump. ?Medicine to treat irritation and swelling (corticosteroids). ?Taking off part  of an ingrown toenail. ?A bandage (dressing) may be placed over the nail area. ?Follow these instructions at home: ?Wound care ?Keep the affected area clean. ?Soak the fingers or toes in warm water as told by your doctor. You may be told to do this for 20 minutes, 2-3 times a day. ?Keep the area dry when you are not soaking it. ?Do not try to drain a fluid-filled bump on your own. ?Follow instructions from your doctor about how to take care of the affected area. Make sure you: ?Wash your hands with soap and water for at least 20 seconds before and after you change your bandage. If you cannot use soap and water, use hand sanitizer. ?Change your bandage as told by your doctor. ?If you had a fluid-filled bump and your doctor drained it, check the area every day for signs of infection. Check for: ?Redness, swelling, or pain. ?Fluid or blood. ?Warmth. ?Pus or a bad smell. ?Medicines ? ?Take over-the-counter and prescription medicines only as told by your doctor. ?If you were prescribed an antibiotic medicine, take it as told by your doctor. Do not stop taking it even if you start to feel better. ?General instructions ?Avoid contact with anything that irritates your skin or that you are allergic to. ?Do not pick at the affected area. ?Keep all follow-up visits. ?Prevention ?To prevent this condition from happening again: ?Wear rubber gloves when putting your hands in water for washing dishes or other tasks. ?Wear gloves if your  hands might touch cleaners or chemicals. ?Avoid injuring your nails or fingertips. ?Do not bite your nails or tear hangnails. ?Do not cut your nails very short. ?Do not cut the skin at the base and sides of the nail. ?Use clean nail clippers or scissors when trimming nails. ?Contact a doctor if: ?You feel worse. ?You do not get better. ?You keep having or you have more fluid, blood, or pus coming from the affected area. ?Your affected finger, toe, or joint gets swollen or hard to move. ?You have a  fever or chills. ?There is redness spreading from the affected area. ?Summary ?Paronychia is an infection of the skin. It happens near a fingernail or toenail. ?This condition may cause pain and swelling around the nail. ?Soak the fingers or toes in warm water as told by your doctor. ?Often, this condition is not serious, and it clears up with treatment. ?This information is not intended to replace advice given to you by your health care provider. Make sure you discuss any questions you have with your health care provider. ?Document Revised: 07/17/2020 Document Reviewed: 07/17/2020 ?Elsevier Patient Education ? [place]. ? ?

## 2021-08-13 NOTE — Patient Outreach (Signed)
Care Coordination ? ?08/13/2021 ? ?Kaisley Keating ?1986-01-25 ?[id number] ? ? ?Medicaid Managed Care  ? ?Unsuccessful Outreach Note ? ?08/13/2021 ?Name: Rhonda Mcgee MRN: [id number] DOB: [birth year] ? ?Referred by: Fenton Foy, NP ?Reason for referral : High Risk Managed Medicaid (Unsuccessful telephone outreach) ? ? ?Third unsuccessful telephone outreach was attempted. The patient was referred to the case management team for assistance with care management and care coordination. The patient's primary care provider has been notified of our unsuccessful attempts to make or maintain contact with the patient. The care management team is pleased to engage with this patient at any time in the future should he/she be interested in assistance from the care management team.  ? ?Follow Up Plan: We have been unable to make contact with the patient for follow up. The care management team is available to follow up with the patient after provider conversation with the patient regarding recommendation for care management engagement and subsequent re-referral to the care management team.  ? ?Aida Raider RN, BSN ?[facility] ?Care Management Coordinator - Managed Medicaid High Risk ?[phone] ?  ? ? ?

## 2021-08-13 NOTE — Patient Instructions (Signed)
Visit Information ? ?Ms. Rhonda Mcgee  - as a part of your Medicaid benefit, you are eligible for care management and care coordination services at no cost or copay..  We have been  unable to reach you by phone but would be happy to help you with your health related needs. Please feel free to call me at [phone] ? ?Aida Raider RN, BSN ?[facility] ?Care Management Coordinator - Managed Medicaid High Risk ?[phone] ?  ?

## 2021-08-13 NOTE — Progress Notes (Signed)
? ?  Follow-Up Visit ?  ?Subjective  ?Rhonda Mcgee is a 36 y.o. female who presents for the following: Follow-up (On her dermatitis. Patient is taking Dupixent. She has done 4 shots total so far. Patient has not seen much improvement. Patient says in the last few weeks she feels very achy like joint aches and very tired. ). ? ? ?The following portions of the chart were reviewed this encounter and updated as appropriate:  Tobacco  Allergies  Meds  Problems  Med Hx  Surg Hx  Fam Hx   ?  ? ?Objective  ?Well appearing patient in no apparent distress; mood and affect are within normal limits. ? ?A focused examination was performed including face. Relevant physical exam findings are noted in the Assessment and Plan. ? ?Left Lower Eyelid, Right Upper Eyelid ?Lighter with less edema ? ? ? ? ? ? ? ? ? ?Assessment & Plan  ?Intrinsic atopic dermatitis ?Right Upper Eyelid; Left Lower Eyelid ? ?Continue Dupixent ? ? ? ?I, Loma [place], PA-C, have reviewed all documentation's for this visit.  The documentation on 08/13/21 for the exam, diagnosis, procedures and orders are all accurate and complete. ?

## 2021-08-13 NOTE — Progress Notes (Signed)
$'@Patient'w$  ID: Rhonda Mcgee, female    DOB: [birth year], 36 y.o.   MRN: [id number] ? ?Chief Complaint  ?Patient presents with  ? Hand Pain  ?  Patient is here today for right finger infection after getting her nails done x 1 week. Patient states that when she was getting her nails done the tech cut the side of her finger but it wasn't cleaned right after and the tech kept doing her nails. Patient states that her finger was burning during the process. Patient asked the nail tech to take the nail off due to the swelling and pain in her right finger a day later. Patient symptoms, swelling, tenderness,  and yellowish pus drainage.  ? ? ?Referring provider: ?[name], NP ? ?HPI ? ?Patient presents today for assessment of her right index finger.  She states that she had her nails done 1 week ago.  The nail tech static cut skin around her right index finger.  After this time her nailbed and surrounding tissue became inflamed.  She states that the area drained yellow pus yesterday.  Her finger is still tender and swollen today. Denies f/c/s, n/v/d, hemoptysis, PND, chest pain or edema. ? ? ? ? ? ?Allergies  ?Allergen Reactions  ? Cephalexin Hives  ? Keflex [Cephalexin] Hives  ? ? ?Immunization History  ?Administered Date(s) Administered  ? PFIZER(Purple Top)SARS-COV-2 Vaccination 07/22/2019, 08/17/2019  ? PPD Test 06/24/2014, 07/12/2014  ? ? ?Past Medical History:  ?Diagnosis Date  ? Chlamydia   ? GERD (gastroesophageal reflux disease)   ? Gonorrhea   ? Herpes genitalia   ? Lupus ([place])   ? Rheumatoid arthritis(714.0)   ? ? ?Tobacco History: ?Social History  ? ?Tobacco Use  ?Smoking Status Every Day  ? Packs/day: 0.50  ? Years: 10.00  ? Pack years: 5.00  ? Types: Cigarettes  ? Last attempt to quit: 02/2021  ? Years since quitting: 0.4  ?Smokeless Tobacco Never  ? ?Ready to quit: Not Answered ?Counseling given: Not Answered ? ? ?Outpatient Encounter Medications as of 08/13/2021  ?Medication Sig  ? albuterol (PROAIR  HFA) 108 (90 Base) MCG/ACT inhaler Inhale 1-2 puffs into the lungs every 6 (six) hours as needed for wheezing or shortness of breath.  ? azithromycin (AZASITE) 1 % ophthalmic solution Place 1 drop into both eyes 2 (two) times daily.  ? DUPIXENT 300 MG/2ML SOPN Inject into the skin.  ? hydroxychloroquine (PLAQUENIL) 200 MG tablet Take 2 tablets (400 mg total) by mouth daily.  ? ketoconazole (NIZORAL) 2 % shampoo Apply topically daily.  ? pantoprazole (PROTONIX) 40 MG tablet Take by mouth.  ? tacrolimus (PROTOPIC) 0.1 % ointment Apply topically daily.  ? triamcinolone ointment (KENALOG) 0.1 % Apply 1 application topically 2 (two) times daily as needed (Rash). Apply to face twice a day x 7 days  ? [DISCONTINUED] doxycycline (VIBRA-TABS) 100 MG tablet Take 1 tablet (100 mg total) by mouth 2 (two) times daily for 10 days.  ? [DISCONTINUED] mupirocin ointment (BACTROBAN) 2 % Apply 1 application. topically 2 (two) times daily.  ? doxycycline (VIBRA-TABS) 100 MG tablet Take 1 tablet (100 mg total) by mouth 2 (two) times daily for 10 days.  ? mupirocin ointment (BACTROBAN) 2 % Apply 1 application. topically 2 (two) times daily.  ? ?No facility-administered encounter medications on file as of 08/13/2021.  ? ? ? ?Review of Systems ? ?Review of Systems  ?Constitutional: Negative.   ?HENT: Negative.    ?Cardiovascular: Negative.   ?Gastrointestinal: Negative.   ?  Skin:   ?     Skin irritation and inflammation to right index finger nail bed.  ?Allergic/Immunologic: Negative.   ?Neurological: Negative.   ?Psychiatric/Behavioral: Negative.     ? ? ? ?Physical Exam ? ?BP 110/87   Temp 98.1 ?F (36.7 ?C)   Ht 5' 5.5" (1.664 m)   Wt 208 lb 6.4 oz (94.5 kg)   SpO2 98%   BMI 34.15 kg/m?  ? ?Wt Readings from Last 5 Encounters:  ?08/13/21 208 lb 6.4 oz (94.5 kg)  ?06/20/21 212 lb (96.2 kg)  ?03/08/21 210 lb (95.3 kg)  ?12/19/20 214 lb 9.6 oz (97.3 kg)  ?11/22/20 215 lb 0.8 oz (97.5 kg)  ? ? ? ?Physical Exam ?Vitals and nursing note  reviewed.  ?Constitutional:   ?   General: She is not in acute distress. ?   Appearance: She is well-developed.  ?Cardiovascular:  ?   Rate and Rhythm: Normal rate and regular rhythm.  ?Pulmonary:  ?   Effort: Pulmonary effort is normal.  ?   Breath sounds: Normal breath sounds.  ?Skin: ?   Comments: Skin appears swollen and erythematous to right index finger nail bed. No drainage noted.  ?Neurological:  ?   Mental Status: She is alert and oriented to person, place, and time.  ?Psychiatric:     ?   Mood and Affect: Mood normal.     ?   Behavior: Behavior normal.  ? ? ? ?Lab Results: ? ?CBC ?   ?Component Value Date/Time  ? WBC 4.7 12/19/2020 1335  ? RBC 4.75 12/19/2020 1335  ? HGB 12.1 12/19/2020 1335  ? HGB 11.2 06/13/2020 1642  ? HCT 39.3 12/19/2020 1335  ? HCT 36.3 06/13/2020 1642  ? PLT 247 12/19/2020 1335  ? PLT 305 06/13/2020 1642  ? MCV 82.7 12/19/2020 1335  ? MCV 81 06/13/2020 1642  ? MCH 25.5 (L) 12/19/2020 1335  ? MCHC 30.8 (L) 12/19/2020 1335  ? RDW 22.0 (H) 12/19/2020 1335  ? RDW 15.8 (H) 06/13/2020 1642  ? [facility] 2,797 12/19/2020 1335  ? LYMPHSABS 3.2 (H) 12/03/2018 1524  ? MONOABS 1.0 11/28/2020 1259  ? EOSABS 122 12/19/2020 1335  ? EOSABS 0.1 12/03/2018 1524  ? BASOSABS 42 12/19/2020 1335  ? BASOSABS 0.1 12/03/2018 1524  ? ? ?BMET ?   ?Component Value Date/Time  ? NA 132 (L) 11/28/2020 1259  ? NA 142 12/03/2018 1524  ? K 3.4 (L) 11/28/2020 1259  ? CL 102 11/28/2020 1259  ? CO2 18 (L) 11/28/2020 1259  ? GLUCOSE 108 (H) 11/28/2020 1259  ? BUN 7 11/28/2020 1259  ? BUN 7 12/03/2018 1524  ? CREATININE 1.24 (H) 11/28/2020 1259  ? CREATININE 0.86 08/15/2020 1021  ? CALCIUM 9.1 11/28/2020 1259  ? GFRNONAA 59 (L) 11/28/2020 1259  ? GFRNONAA 89 08/15/2020 1021  ? GFRAA 103 08/15/2020 1021  ? ? ?BNP ?No results found for: BNP ? ?ProBNP ?No results found for: PROBNP ? ?Imaging: ?No results found. ? ? ?Assessment & Plan:  ? ?Paronychia of right index finger ?Warm soaks 3 times daily ? ?- mupirocin ointment  (BACTROBAN) 2 %; Apply 1 application. topically 2 (two) times daily.  Dispense: 22 g; Refill: 0 ?- doxycycline (VIBRA-TABS) 100 MG tablet; Take 1 tablet (100 mg total) by mouth 2 (two) times daily for 10 days.  Dispense: 20 tablet; Refill: 0 ? ?Follow up: ? ?Follow up if needed ? ? ? ? ?Fenton Foy, NP ?08/15/2021 ? ?

## 2021-08-15 ENCOUNTER — Encounter: Payer: Self-pay | Admitting: Nurse Practitioner

## 2021-08-15 DIAGNOSIS — L03011 Cellulitis of right finger: Secondary | ICD-10-CM | POA: Insufficient documentation

## 2021-08-15 NOTE — Assessment & Plan Note (Signed)
Warm soaks 3 times daily ? ?- mupirocin ointment (BACTROBAN) 2 %; Apply 1 application. topically 2 (two) times daily.  Dispense: 22 g; Refill: 0 ?- doxycycline (VIBRA-TABS) 100 MG tablet; Take 1 tablet (100 mg total) by mouth 2 (two) times daily for 10 days.  Dispense: 20 tablet; Refill: 0 ? ?Follow up: ? ?Follow up if needed ?

## 2021-09-13 ENCOUNTER — Ambulatory Visit (INDEPENDENT_AMBULATORY_CARE_PROVIDER_SITE_OTHER): Payer: Medicaid Other | Admitting: Nurse Practitioner

## 2021-09-13 ENCOUNTER — Encounter: Payer: Self-pay | Admitting: Nurse Practitioner

## 2021-09-13 VITALS — BP 117/71 | HR 82 | Temp 98.0°F | Ht 65.5 in | Wt 206.8 lb

## 2021-09-13 DIAGNOSIS — M329 Systemic lupus erythematosus, unspecified: Secondary | ICD-10-CM | POA: Diagnosis not present

## 2021-09-13 DIAGNOSIS — E611 Iron deficiency: Secondary | ICD-10-CM | POA: Diagnosis not present

## 2021-09-13 DIAGNOSIS — Z0000 Encounter for general adult medical examination without abnormal findings: Secondary | ICD-10-CM | POA: Diagnosis not present

## 2021-09-13 DIAGNOSIS — R531 Weakness: Secondary | ICD-10-CM | POA: Diagnosis not present

## 2021-09-13 DIAGNOSIS — R110 Nausea: Secondary | ICD-10-CM | POA: Diagnosis not present

## 2021-09-13 LAB — COMPREHENSIVE METABOLIC PANEL
ALT: 12 U/L (ref 0–44)
AST: 16 U/L (ref 15–41)
Albumin: 3.7 g/dL (ref 3.5–5.0)
Alkaline Phosphatase: 83 U/L (ref 38–126)
Anion gap: 7 (ref 5–15)
BUN: 8 mg/dL (ref 6–20)
CO2: 23 mmol/L (ref 22–32)
Calcium: 8.9 mg/dL (ref 8.9–10.3)
Chloride: 111 mmol/L (ref 98–111)
Creatinine, Ser: 0.91 mg/dL (ref 0.44–1.00)
GFR, Estimated: >60 mL/min (ref >60)
Glucose, Bld: 90 mg/dL (ref 70–99)
Potassium: 4.2 mmol/L (ref 3.5–5.1)
Sodium: 141 mmol/L (ref 135–145)
Total Bilirubin: 0.7 mg/dL (ref 0.3–1.2)
Total Protein: 7.3 g/dL (ref 6.5–8.1)

## 2021-09-13 LAB — CBC WITH DIFFERENTIAL/PLATELET
Abs Immature Granulocytes: 0.01 10*3/uL (ref 0.00–0.07)
Basophils Absolute: 0 10*3/uL (ref 0.0–0.1)
Basophils Relative: 1 %
Eosinophils Absolute: 0 10*3/uL (ref 0.0–0.5)
Eosinophils Relative: 1 %
HCT: 36.4 % (ref 36.0–46.0)
Hemoglobin: 11.7 g/dL — ABNORMAL LOW (ref 12.0–15.0)
Immature Granulocytes: 0 %
Lymphocytes Relative: 55 %
Lymphs Abs: 2.1 10*3/uL (ref 0.7–4.0)
MCH: 26.3 pg (ref 26.0–34.0)
MCHC: 32.1 g/dL (ref 30.0–36.0)
MCV: 81.8 fL (ref 80.0–100.0)
Monocytes Absolute: 0.3 10*3/uL (ref 0.1–1.0)
Monocytes Relative: 8 %
Neutro Abs: 1.3 10*3/uL — ABNORMAL LOW (ref 1.7–7.7)
Neutrophils Relative %: 35 %
Platelets: 296 10*3/uL (ref 150–400)
RBC: 4.45 MIL/uL (ref 3.87–5.11)
RDW: 15.6 % — ABNORMAL HIGH (ref 11.5–15.5)
WBC: 3.7 10*3/uL — ABNORMAL LOW (ref 4.0–10.5)
nRBC: 0 % (ref 0.0–0.2)

## 2021-09-13 LAB — POCT URINALYSIS DIP (CLINITEK)
Bilirubin, UA: NEGATIVE
Glucose, UA: NEGATIVE mg/dL
Ketones, POC UA: NEGATIVE mg/dL
Leukocytes, UA: NEGATIVE
Nitrite, UA: NEGATIVE
POC PROTEIN,UA: NEGATIVE
Spec Grav, UA: >=1.03 — AB (ref 1.010–1.025)
Urobilinogen, UA: 0.2 E.U./dL
pH, UA: 6 (ref 5.0–8.0)

## 2021-09-13 LAB — IRON AND TIBC
Iron: 21 ug/dL — ABNORMAL LOW (ref 28–170)
Saturation Ratios: 4 % — ABNORMAL LOW (ref 10.4–31.8)
TIBC: 477 ug/dL — ABNORMAL HIGH (ref 250–450)
UIBC: 456 ug/dL

## 2021-09-13 LAB — POCT URINE PREGNANCY: Preg Test, Ur: NEGATIVE

## 2021-09-13 LAB — FERRITIN: Ferritin: 6 ng/mL — ABNORMAL LOW (ref 11–307)

## 2021-09-13 LAB — C-REACTIVE PROTEIN: CRP: 0.7 mg/dL (ref <1.0)

## 2021-09-13 LAB — HIV ANTIBODY (ROUTINE TESTING W REFLEX): HIV Screen 4th Generation wRfx: NONREACTIVE

## 2021-09-13 LAB — SEDIMENTATION RATE: Sed Rate: 40 mm/hr — ABNORMAL HIGH (ref 0–22)

## 2021-09-13 MED ORDER — FERROUS SULFATE 325 (65 FE) MG PO TABS: 325.0000 mg | ORAL_TABLET | Freq: Every day | ORAL | 3 refills | Status: DC

## 2021-09-13 MED ORDER — ONDANSETRON HCL 4 MG PO TABS: 4.0000 mg | ORAL_TABLET | Freq: Three times a day (TID) | ORAL | 0 refills | Status: DC | PRN

## 2021-09-13 MED ORDER — PROMETHAZINE HCL 25 MG PO TABS: 25.0000 mg | ORAL_TABLET | Freq: Three times a day (TID) | ORAL | 0 refills | Status: AC | PRN

## 2021-09-13 NOTE — Progress Notes (Signed)
[place], [place]  [address] Phone:  [phone]   Fax:  [phone] Subjective:   Patient ID: Rhonda Mcgee, female    DOB: 1985/08/12, 36 y.o.   MRN: [id number]  Chief Complaint  Patient presents with   Headache    Patient is here today for headaches, nausea, very fatigue, dizziness, lightheadedness x 1 week.   HPI Rhonda Mcgee 36 y.o. female  has a past medical history of Chlamydia, GERD (gastroesophageal reflux disease), Gonorrhea, Herpes genitalia, Lupus ([facility]), and Rheumatoid arthritis(714.0). To the [facility] for headaches, nausea, fatigue, dizziness and lightheadedness for 1 wk.   Has been taking OTC medication for nausea, denies any changes in appetite. States that she has had an increased craving for ice. Denies any concern of pregnancy, has history of tubal ligation and menstrual cycle this morning. Concerned that recently started [place] may be associated with symptoms, started 3 mths ago. Patient was prescribed medication for lupus, was also prescribed plaquenil, which she has not taken in several months due to SE. Last visit with rheumatology 2 mths ago. Denies any other concerns today.   Denies any fatigue,HA or dizziness. Denies any blurred vision, numbness or tingling.   Past Medical History:  Diagnosis Date   Chlamydia    GERD (gastroesophageal reflux disease)    Gonorrhea    Herpes genitalia    Lupus ([facility])    Rheumatoid arthritis(714.0)     Past Surgical History:  Procedure Laterality Date   ADENOIDECTOMY     CHOLECYSTECTOMY     DILATION AND CURETTAGE OF UTERUS     elective ab   GALLBLADDER SURGERY     TONSILLECTOMY     TUBAL LIGATION      Family History  Adopted: Yes  Problem Relation Age of Onset   Diabetes Mother    Hypertension Mother    Diabetes Sister    Hypertension Sister    Migraines Sister        with menstrual cycle, had vision loss with migraines   Lupus Maternal Grandmother     Esophageal cancer Neg Hx    Rectal cancer Neg Hx    Stomach cancer Neg Hx    Colon cancer Neg Hx     Social History   Socioeconomic History   Marital status: Single    Spouse name: Not on file   Number of children: 2   Years of education: Not on file   Highest education level: Some college, no degree  Occupational History   Not on file  Tobacco Use   Smoking status: Every Day    Packs/day: 0.50    Years: 10.00    Pack years: 5.00    Types: Cigarettes    Last attempt to quit: 02/2021    Years since quitting: 0.5   Smokeless tobacco: Never  Vaping Use   Vaping Use: Every day   Substances: Nicotine, Flavoring  Substance and Sexual Activity   Alcohol use: Yes    Comment: occasional   Drug use: No   Sexual activity: Yes    Birth control/protection: None, Surgical    Comment: tubal ligation  Other Topics Concern   Not on file  Social History Narrative   Lives at home with her children   Right handed   Caffeine: 32 oz sweet tea/day   Social Determinants of Health   Financial Resource Strain: Not on file  Food Insecurity: Not on file  Transportation Needs: Not on file  Physical Activity: Not on file  Stress: Not on file  Social Connections: Not on file  Intimate Partner Violence: Not on file    Outpatient Medications Prior to Visit  Medication Sig Dispense Refill   DUPIXENT 300 MG/2ML SOPN Inject into the skin.     hydroxychloroquine (PLAQUENIL) 200 MG tablet Take 2 tablets (400 mg total) by mouth daily. 180 tablet 1   mupirocin ointment (BACTROBAN) 2 % Apply 1 application. topically 2 (two) times daily. 22 g 0   tacrolimus (PROTOPIC) 0.1 % ointment Apply topically daily. 100 g 0   triamcinolone ointment (KENALOG) 0.1 % Apply 1 application topically 2 (two) times daily as needed (Rash). Apply to face twice a day x 7 days 30 g 0   albuterol (PROAIR HFA) 108 (90 Base) MCG/ACT inhaler Inhale 1-2 puffs into the lungs every 6 (six) hours as needed for wheezing or  shortness of breath. (Patient not taking: Reported on 09/13/2021) 8 g 1   azithromycin (AZASITE) 1 % ophthalmic solution Place 1 drop into both eyes 2 (two) times daily. (Patient not taking: Reported on 09/13/2021) 2.5 mL 0   ketoconazole (NIZORAL) 2 % shampoo Apply topically daily. (Patient not taking: Reported on 09/13/2021)     pantoprazole (PROTONIX) 40 MG tablet Take by mouth. (Patient not taking: Reported on 09/13/2021)     No facility-administered medications prior to visit.    Allergies  Allergen Reactions   Cephalexin Hives   Keflex [Cephalexin] Hives    Review of Systems  Constitutional:  Positive for malaise/fatigue. Negative for chills and fever.  HENT: Negative.    Eyes: Negative.   Respiratory:  Negative for cough and shortness of breath.   Cardiovascular:  Negative for chest pain, palpitations and leg swelling.  Gastrointestinal:  Positive for nausea. Negative for abdominal pain, blood in stool, constipation, diarrhea and vomiting.  Musculoskeletal: Negative.   Skin: Negative.   Neurological:  Positive for dizziness and headaches.  Psychiatric/Behavioral:  Negative for depression. The patient is not nervous/anxious.   All other systems reviewed and are negative.     Objective:    Physical Exam Vitals reviewed.  Constitutional:      General: She is not in acute distress.    Appearance: Normal appearance. She is obese.  HENT:     Head: Normocephalic.     Mouth/Throat:     Mouth: Mucous membranes are moist.  Eyes:     Extraocular Movements: Extraocular movements intact.     Pupils: Pupils are equal, round, and reactive to light.  Cardiovascular:     Rate and Rhythm: Normal rate and regular rhythm.     Pulses: Normal pulses.     Heart sounds: Normal heart sounds.     Comments: No obvious peripheral edema Pulmonary:     Effort: Pulmonary effort is normal.     Breath sounds: Normal breath sounds.  Abdominal:     General: Bowel sounds are normal. There is no  distension.     Palpations: Abdomen is soft. There is no mass.     Tenderness: There is no abdominal tenderness. There is no guarding.  Musculoskeletal:        General: Normal range of motion.     Cervical back: Normal range of motion and neck supple.  Skin:    General: Skin is warm and dry.     Capillary Refill: Capillary refill takes less than 2 seconds.  Neurological:     Mental Status: She is alert and oriented to  person, place, and time.  Psychiatric:        Mood and Affect: Mood normal.        Behavior: Behavior normal.        Thought Content: Thought content normal.        Judgment: Judgment normal.    BP 117/71   Pulse 82   Temp 98 F (36.7 C)   Ht 5' 5.5" (1.664 m)   Wt 206 lb 12.8 oz (93.8 kg)   LMP 09/13/2021 (Exact Date)   SpO2 98%   BMI 33.89 kg/m  Wt Readings from Last 3 Encounters:  09/13/21 206 lb 12.8 oz (93.8 kg)  08/13/21 208 lb 6.4 oz (94.5 kg)  06/20/21 212 lb (96.2 kg)    Immunization History  Administered Date(s) Administered   PFIZER(Purple Top)SARS-COV-2 Vaccination 07/22/2019, 08/17/2019   PPD Test 06/24/2014, 07/12/2014    Diabetic Foot Exam - Simple   No data filed     Lab Results  Component Value Date   TSH 0.249 (L) 11/22/2020   Lab Results  Component Value Date   WBC 3.7 (L) 09/13/2021   HGB 11.7 (L) 09/13/2021   HCT 36.4 09/13/2021   MCV 81.8 09/13/2021   PLT 296 09/13/2021   Lab Results  Component Value Date   NA 141 09/13/2021   K 4.2 09/13/2021   CO2 23 09/13/2021   GLUCOSE 90 09/13/2021   BUN 8 09/13/2021   CREATININE 0.91 09/13/2021   BILITOT 0.7 09/13/2021   ALKPHOS 83 09/13/2021   AST 16 09/13/2021   ALT 12 09/13/2021   PROT 7.3 09/13/2021   ALBUMIN 3.7 09/13/2021   CALCIUM 8.9 09/13/2021   ANIONGAP 7 09/13/2021   No results found for: CHOL No results found for: HDL No results found for: LDLCALC No results found for: TRIG No results found for: CHOLHDL Lab Results  Component Value Date   HGBA1C 5.2  12/03/2018       Assessment & Plan:   Problem List Items Addressed This Visit       Other   Lupus ([place])   Relevant Orders   C3 and C4   Urinalysis with Culture Reflex   Microalbumin/Creatinine Ratio, Urine (Completed)   CBC with Differential/Platelet   Iron, TIBC and Ferritin Panel   Sedimentation Rate   Comprehensive metabolic panel   C-reactive protein (Completed) Concerned for possible lupus flare   Other Visit Diagnoses     Healthcare maintenance    -  Primary   Relevant Orders   POCT URINALYSIS DIP (CLINITEK) (Completed)   POCT urine pregnancy (Completed) Encouraged continued diet and exercise efforts  Encouraged continued compliance with medication     Nausea       Relevant Medications   promethazine (PHENERGAN) 25 MG tablet Discussed non pharmacological methods for management of symptoms Informed to take OTC medications as needed    Weakness       Relevant Orders   HIV antibody (with reflex) (Completed) Patient requested STI testing, but denies any known risk factors   Iron deficiency       Relevant Medications   ferrous sulfate 325 (65 FE) MG tablet, initiated during visit Discussed non pharmacological methods for management of symptoms Informed to take OTC medications as needed    Follow up in 2 wks for reevaluation of symptoms, sooner as needed     I have discontinued Rhonda Mcgee's ondansetron. I am also having her start on promethazine and ferrous sulfate. Additionally, I am having her maintain  her pantoprazole, ketoconazole, azithromycin, albuterol, triamcinolone ointment, tacrolimus, hydroxychloroquine, Dupixent, and mupirocin ointment.  Meds ordered this encounter  Medications   DISCONTD: ondansetron (ZOFRAN) 4 MG tablet    Sig: Take 1 tablet (4 mg total) by mouth every 8 (eight) hours as needed for nausea or vomiting.    Dispense:  20 tablet    Refill:  0   promethazine (PHENERGAN) 25 MG tablet    Sig: Take 1 tablet (25 mg total) by  mouth every 8 (eight) hours as needed for nausea or vomiting.    Dispense:  20 tablet    Refill:  0   ferrous sulfate 325 (65 FE) MG tablet    Sig: Take 1 tablet (325 mg total) by mouth daily.    Dispense:  30 tablet    Refill:  3     Teena Dunk, NP

## 2021-09-13 NOTE — Patient Instructions (Signed)
You were seen today in the [facility] for multiple symptoms. Labs were collected, results will be available via MyChart or, if abnormal, you will be contacted by clinic staff. You were prescribed medications, please take as directed. Please follow up in 2 wks for reevaluation of symptoms.

## 2021-09-14 LAB — MICROALBUMIN / CREATININE URINE RATIO
Creatinine, Urine: 63.1 mg/dL
Microalb/Creat Ratio: <5 mg/g creat (ref 0–29)
Microalbumin, Urine: <3 ug/mL

## 2021-09-14 LAB — C-REACTIVE PROTEIN: CRP: <1 mg/L (ref 0–10)

## 2021-09-14 LAB — HIV ANTIBODY (ROUTINE TESTING W REFLEX): HIV Screen 4th Generation wRfx: NONREACTIVE

## 2021-09-17 ENCOUNTER — Other Ambulatory Visit: Payer: Self-pay | Admitting: Nurse Practitioner

## 2021-09-17 ENCOUNTER — Telehealth: Payer: Self-pay

## 2021-09-17 DIAGNOSIS — G894 Chronic pain syndrome: Secondary | ICD-10-CM

## 2021-09-17 NOTE — Telephone Encounter (Signed)
Please call to let patient know that I have placed a referral for pain management.

## 2021-09-18 ENCOUNTER — Other Ambulatory Visit: Payer: Self-pay

## 2021-09-18 ENCOUNTER — Emergency Department (HOSPITAL_COMMUNITY)
Admission: EM | Admit: 2021-09-18 | Discharge: 2021-09-18 | Disposition: A | Discharge status: Home / Self Care | Payer: Medicaid Other | Attending: Emergency Medicine | Admitting: Emergency Medicine

## 2021-09-18 ENCOUNTER — Emergency Department (HOSPITAL_COMMUNITY): Payer: Medicaid Other

## 2021-09-18 ENCOUNTER — Encounter (HOSPITAL_COMMUNITY): Payer: Self-pay | Admitting: Pharmacy Technician

## 2021-09-18 DIAGNOSIS — R531 Weakness: Secondary | ICD-10-CM | POA: Insufficient documentation

## 2021-09-18 DIAGNOSIS — R079 Chest pain, unspecified: Secondary | ICD-10-CM

## 2021-09-18 DIAGNOSIS — M545 Low back pain, unspecified: Secondary | ICD-10-CM | POA: Insufficient documentation

## 2021-09-18 DIAGNOSIS — R0789 Other chest pain: Secondary | ICD-10-CM | POA: Diagnosis not present

## 2021-09-18 LAB — URINALYSIS, ROUTINE W REFLEX MICROSCOPIC
Bilirubin Urine: NEGATIVE
Glucose, UA: NEGATIVE mg/dL
Hgb urine dipstick: NEGATIVE
Ketones, ur: NEGATIVE mg/dL
Leukocytes,Ua: NEGATIVE
Nitrite: NEGATIVE
Protein, ur: NEGATIVE mg/dL
Specific Gravity, Urine: 1.018 (ref 1.005–1.030)
pH: 6 (ref 5.0–8.0)

## 2021-09-18 LAB — COMPREHENSIVE METABOLIC PANEL
ALT: 12 U/L (ref 0–44)
AST: 16 U/L (ref 15–41)
Albumin: 3.5 g/dL (ref 3.5–5.0)
Alkaline Phosphatase: 74 U/L (ref 38–126)
Anion gap: 4 — ABNORMAL LOW (ref 5–15)
BUN: 6 mg/dL (ref 6–20)
CO2: 24 mmol/L (ref 22–32)
Calcium: 8.4 mg/dL — ABNORMAL LOW (ref 8.9–10.3)
Chloride: 110 mmol/L (ref 98–111)
Creatinine, Ser: 0.83 mg/dL (ref 0.44–1.00)
GFR, Estimated: >60 mL/min (ref >60)
Glucose, Bld: 86 mg/dL (ref 70–99)
Potassium: 3.9 mmol/L (ref 3.5–5.1)
Sodium: 138 mmol/L (ref 135–145)
Total Bilirubin: 0.5 mg/dL (ref 0.3–1.2)
Total Protein: 7.2 g/dL (ref 6.5–8.1)

## 2021-09-18 LAB — CBC WITH DIFFERENTIAL/PLATELET
Abs Immature Granulocytes: 0 10*3/uL (ref 0.00–0.07)
Basophils Absolute: 0 10*3/uL (ref 0.0–0.1)
Basophils Relative: 1 %
Eosinophils Absolute: 0 10*3/uL (ref 0.0–0.5)
Eosinophils Relative: 1 %
HCT: 34.4 % — ABNORMAL LOW (ref 36.0–46.0)
Hemoglobin: 10.9 g/dL — ABNORMAL LOW (ref 12.0–15.0)
Immature Granulocytes: 0 %
Lymphocytes Relative: 59 %
Lymphs Abs: 2.5 10*3/uL (ref 0.7–4.0)
MCH: 26.1 pg (ref 26.0–34.0)
MCHC: 31.7 g/dL (ref 30.0–36.0)
MCV: 82.5 fL (ref 80.0–100.0)
Monocytes Absolute: 0.3 10*3/uL (ref 0.1–1.0)
Monocytes Relative: 8 %
Neutro Abs: 1.3 10*3/uL — ABNORMAL LOW (ref 1.7–7.7)
Neutrophils Relative %: 31 %
Platelets: 281 10*3/uL (ref 150–400)
RBC: 4.17 MIL/uL (ref 3.87–5.11)
RDW: 15.7 % — ABNORMAL HIGH (ref 11.5–15.5)
WBC: 4.1 10*3/uL (ref 4.0–10.5)
nRBC: 0 % (ref 0.0–0.2)

## 2021-09-18 LAB — I-STAT BETA HCG BLOOD, ED (MC, WL, AP ONLY): I-stat hCG, quantitative: <5 m[IU]/mL (ref <5)

## 2021-09-18 LAB — WET PREP, GENITAL
Sperm: NONE SEEN
Trich, Wet Prep: NONE SEEN
WBC, Wet Prep HPF POC: <10 (ref <10)
Yeast Wet Prep HPF POC: NONE SEEN

## 2021-09-18 LAB — LIPASE, BLOOD: Lipase: 32 U/L (ref 11–51)

## 2021-09-18 MED ORDER — KETOROLAC TROMETHAMINE 15 MG/ML IJ SOLN
15.0000 mg | Freq: Once | INTRAMUSCULAR | Status: AC
  Administered 2021-09-18: 15 mg via INTRAVENOUS
  Filled 2021-09-18: qty 1

## 2021-09-18 MED ORDER — ONDANSETRON 4 MG PO TBDP
4.0000 mg | ORAL_TABLET | Freq: Once | ORAL | Status: AC
  Administered 2021-09-18: 4 mg via ORAL
  Filled 2021-09-18: qty 1

## 2021-09-18 MED ORDER — ONDANSETRON 4 MG PO TBDP: ORAL_TABLET | ORAL | 0 refills | Status: DC

## 2021-09-18 MED ORDER — METRONIDAZOLE 500 MG PO TABS
500.0000 mg | ORAL_TABLET | Freq: Once | ORAL | Status: AC
  Administered 2021-09-18: 500 mg via ORAL
  Filled 2021-09-18: qty 1

## 2021-09-18 MED ORDER — SODIUM CHLORIDE 0.9 % IV BOLUS
1000.0000 mL | Freq: Once | INTRAVENOUS | Status: AC
Start: 2021-09-18 — End: 2021-09-18
  Administered 2021-09-18: 1000 mL via INTRAVENOUS

## 2021-09-18 NOTE — Discharge Instructions (Addendum)
Please call your family doctor and discuss if they want to try and start you on systemic steroids for possible lupus flare.  Please return to the emergency department for worsening symptoms.

## 2021-09-18 NOTE — ED Provider Notes (Signed)
[facility] DEPT Provider Note   CSN: [id number] Arrival date & time: 09/18/21  0973     History  Chief Complaint  Patient presents with   Abnormal Lab    Rhonda Mcgee is a 36 y.o. female.  36 yo F with a chief complaints of not feeling well.  This been going on for a couple weeks now.  She has been having some pinpoint pain to her left chest that seems to be worse with different positions as well as bilateral lower back pain.  She had seen her family doctor in the office earlier this week and was referred for pain management.  She tells me that she got another call today that told her that she must be very dehydrated and her iron was low and she should come to the emergency department for evaluation.   Abnormal Lab     Home Medications Prior to Admission medications   Medication Sig Start Date End Date Taking? Authorizing Provider  ondansetron (ZOFRAN-ODT) 4 MG disintegrating tablet '4mg'$  ODT q4 hours prn nausea/vomit 09/18/21  Yes Deno Etienne, DO  albuterol (PROAIR HFA) 108 (90 Base) MCG/ACT inhaler Inhale 1-2 puffs into the lungs every 6 (six) hours as needed for wheezing or shortness of breath. Patient not taking: Reported on 09/13/2021 11/22/20 11/22/21  [name], NP  azithromycin (AZASITE) 1 % ophthalmic solution Place 1 drop into both eyes 2 (two) times daily. Patient not taking: Reported on 09/13/2021 11/22/20 11/22/21  [name], NP  DUPIXENT 300 MG/2ML SOPN Inject into the skin. 07/23/21   [provider]  ferrous sulfate 325 (65 FE) MG tablet Take 1 tablet (325 mg total) by mouth daily. 09/13/21 09/13/22  Bo Merino I, NP  hydroxychloroquine (PLAQUENIL) 200 MG tablet Take 2 tablets (400 mg total) by mouth daily. 06/23/21   Collier Salina, MD  ketoconazole (NIZORAL) 2 % shampoo Apply topically daily. Patient not taking: Reported on 09/13/2021 06/07/20   [provider]  mupirocin ointment (BACTROBAN) 2 % Apply  1 application. topically 2 (two) times daily. 08/13/21   Fenton Foy, NP  pantoprazole (PROTONIX) 40 MG tablet Take by mouth. Patient not taking: Reported on 09/13/2021 03/04/20   [provider]  promethazine (PHENERGAN) 25 MG tablet Take 1 tablet (25 mg total) by mouth every 8 (eight) hours as needed for nausea or vomiting. 09/13/21   [place], Jake Church I, NP  tacrolimus (PROTOPIC) 0.1 % ointment Apply topically daily. 02/08/21   [place], Ronalee Red, PA-C  triamcinolone ointment (KENALOG) 0.1 % Apply 1 application topically 2 (two) times daily as needed (Rash). Apply to face twice a day x 7 days 02/08/21   Warren Danes, PA-C      Allergies    Cephalexin and Keflex [cephalexin]    Review of Systems   Review of Systems  Physical Exam Updated Vital Signs BP 120/83   Pulse (!) 56   Temp (!) 97.4 F (36.3 C) (Oral)   Resp 18   LMP 09/13/2021 (Exact Date)   SpO2 100%  Physical Exam Vitals and nursing note reviewed.  Constitutional:      General: She is not in acute distress.    Appearance: She is well-developed. She is not diaphoretic.     Comments: BMI 34  HENT:     Head: Normocephalic and atraumatic.  Eyes:     Pupils: Pupils are equal, round, and reactive to light.  Cardiovascular:     Rate and Rhythm: Normal  rate and regular rhythm.     Heart sounds: No murmur heard.   No friction rub. No gallop.  Pulmonary:     Effort: Pulmonary effort is normal.     Breath sounds: No wheezing or rales.  Abdominal:     General: There is no distension.     Palpations: Abdomen is soft.     Tenderness: There is no abdominal tenderness.  Musculoskeletal:        General: Tenderness present.     Cervical back: Normal range of motion and neck supple.     Comments: Pain along the left anterior chest wall about the medial clavicular line about ribs 4 through 6 reproduces her symptoms  Skin:    General: Skin is warm and dry.  Neurological:     Mental Status: She is alert and  oriented to person, place, and time.  Psychiatric:        Behavior: Behavior normal.    ED Results / Procedures / Treatments   Labs (all labs ordered are listed, but only abnormal results are displayed) Labs Reviewed  WET PREP, GENITAL - Abnormal; Notable for the following components:      Result Value   Clue Cells Wet Prep HPF POC PRESENT (*)    All other components within normal limits  CBC WITH DIFFERENTIAL/PLATELET - Abnormal; Notable for the following components:   Hemoglobin 10.9 (*)    HCT 34.4 (*)    RDW 15.7 (*)    Neutro Abs 1.3 (*)    All other components within normal limits  COMPREHENSIVE METABOLIC PANEL - Abnormal; Notable for the following components:   Calcium 8.4 (*)    Anion gap 4 (*)    All other components within normal limits  LIPASE, BLOOD  URINALYSIS, ROUTINE W REFLEX MICROSCOPIC  I-STAT BETA HCG BLOOD, ED ([facility], [facility], [facility] ONLY)  GC/CHLAMYDIA PROBE AMP ([place]) NOT AT [facility]    EKG EKG Interpretation  Date/Time:  Tuesday Sep 18 2021 10:37:45 EDT Ventricular Rate:  73 PR Interval:  154 QRS Duration: 73 QT Interval:  407 QTC Calculation: 449 R Axis:   46 Text Interpretation: Sinus rhythm Probable left atrial enlargement Low voltage, precordial leads Baseline wander in lead(s) III aVL aVF No significant change since last tracing Confirmed by Deno Etienne [phone]) on 09/18/2021 11:02:14 AM  Radiology DG Chest Port 1 View  Result Date: 09/18/2021 CLINICAL DATA:  Chest pain. EXAM: PORTABLE CHEST 1 VIEW COMPARISON:  November 28, 2020. FINDINGS: The heart size and mediastinal contours are within normal limits. Both lungs are clear. The visualized skeletal structures are unremarkable. IMPRESSION: No active disease. Electronically Signed   By: Marijo Conception M.D.   On: 09/18/2021 10:34    Procedures Procedures    Medications Ordered in ED Medications  sodium chloride 0.9 % bolus 1,000 mL (0 mLs Intravenous Stopped 09/18/21 1157)  ketorolac (TORADOL) 15 MG/ML  injection 15 mg (15 mg Intravenous Given 09/18/21 1103)  metroNIDAZOLE (FLAGYL) tablet 500 mg (500 mg Oral Given 09/18/21 1156)  ondansetron (ZOFRAN-ODT) disintegrating tablet 4 mg (4 mg Oral Given 09/18/21 1157)    ED Course/ Medical Decision Making/ A&P                           Medical Decision Making Amount and/or Complexity of Data Reviewed Labs: ordered. Radiology: ordered. ECG/medicine tests: ordered.  Risk Prescription drug management.   36 yo F with a chief complaints of not  feeling well.  This been going on for a couple weeks.  She is complaining of some chest and low back pain as well.  Most likely these are musculoskeletal based on history and physical exam.  We will obtain a chest x-ray and an EKG.  She was reportedly told by her family doctor that she was profoundly dehydrated and had low iron levels that required emergent ED visit this morning.  On my independent interpretation of the lab work that she had done she has a mild iron deficiency without significant anemia and has no significant change to her renal function.  Despite this I will give her a bolus of IV fluids.  Will repeat a laboratory evaluation.  Patient is concerned that she may have gonorrhea though does not have any significant vaginal discharge we will have her self swab.  Chest x-ray independently interpreted by me without focal infiltrate or pneumothorax.  Patient's labs are resulted without significant finding.  No significant change to her hemoglobin.  Pregnancy test is negative.  LFTs and lipase are unremarkable.  She does have BV.  I would hesitate to treat her as she is asymptomatic and she has had recurrent nausea and vomiting which could make things worse.  We will have her call her family doctor this morning and discuss how she is doing and see if they would recommend systemic steroids.  12:00 PM:  I have discussed the diagnosis/risks/treatment options with the patient.  Evaluation and diagnostic testing  in the emergency department does not suggest an emergent condition requiring admission or immediate intervention beyond what has been performed at this time.  They will follow up with  PCP. We also discussed returning to the ED immediately if new or worsening sx occur. We discussed the sx which are most concerning (e.g., sudden worsening pain, fever, inability to tolerate by mouth) that necessitate immediate return. Medications administered to the patient during their visit and any new prescriptions provided to the patient are listed below.  Medications given during this visit Medications  sodium chloride 0.9 % bolus 1,000 mL (0 mLs Intravenous Stopped 09/18/21 1157)  ketorolac (TORADOL) 15 MG/ML injection 15 mg (15 mg Intravenous Given 09/18/21 1103)  metroNIDAZOLE (FLAGYL) tablet 500 mg (500 mg Oral Given 09/18/21 1156)  ondansetron (ZOFRAN-ODT) disintegrating tablet 4 mg (4 mg Oral Given 09/18/21 1157)     The patient appears reasonably screen and/or stabilized for discharge and I doubt any other medical condition or other [facility] requiring further screening, evaluation, or treatment in the ED at this time prior to discharge.           Final Clinical Impression(s) / ED Diagnoses Final diagnoses:  Weakness  Nonspecific chest pain    Rx / DC Orders ED Discharge Orders          Ordered    ondansetron (ZOFRAN-ODT) 4 MG disintegrating tablet        09/18/21 [place], Bindu Docter, DO 09/18/21 1200

## 2021-09-18 NOTE — ED Triage Notes (Signed)
Pt here with reports of having a lupus flare up for the last few weeks as well as feeling run down. Pt went to PCP and had lab work done. States her PCP called her today and stated she was dehydrated and anemic.

## 2021-09-19 ENCOUNTER — Telehealth: Payer: Self-pay

## 2021-09-19 LAB — GC/CHLAMYDIA PROBE AMP (~~LOC~~) NOT AT ARMC
Chlamydia: NEGATIVE
Comment: NEGATIVE
Comment: NORMAL
Neisseria Gonorrhea: NEGATIVE

## 2021-09-19 NOTE — Telephone Encounter (Signed)
Transition Care Management Follow-up Telephone Call Date of discharge and from where: 09/18/2021 from [facility] How have you been since you were released from the hospital? Patient stated that she is feeling okay and she is still very tired/sleepy. Patient did not have any questions or concerns at this time.  Any questions or concerns? No  Items Reviewed: Did the pt receive and understand the discharge instructions provided? Yes  Medications obtained and verified? Yes  Other? No  Any new allergies since your discharge? No  Dietary orders reviewed? No Do you have support at home? Yes   Functional Questionnaire: (I = Independent and D = Dependent) ADLs: I  Bathing/Dressing- I  Meal Prep- I  Eating- I  Maintaining continence- I  Transferring/Ambulation- I  Managing Meds- I   Follow up appointments reviewed:  PCP Hospital f/u appt confirmed? Yes  Scheduled to see Lazaro Arms, NP on 09/28/2021 Specialist Hospital f/u appt confirmed? No   Are transportation arrangements needed? No  If their condition worsens, is the pt aware to call PCP or go to the Emergency Dept.? Yes Was the patient provided with contact information for the PCP's office or ED? Yes Was to pt encouraged to call back with questions or concerns? Yes

## 2021-09-21 ENCOUNTER — Encounter: Payer: Self-pay | Admitting: Physical Medicine & Rehabilitation

## 2021-09-28 ENCOUNTER — Ambulatory Visit (INDEPENDENT_AMBULATORY_CARE_PROVIDER_SITE_OTHER): Payer: Medicaid Other | Admitting: Nurse Practitioner

## 2021-09-28 ENCOUNTER — Ambulatory Visit: Payer: Medicaid Other | Admitting: Nurse Practitioner

## 2021-09-28 VITALS — Wt 210.5 lb

## 2021-09-28 DIAGNOSIS — M329 Systemic lupus erythematosus, unspecified: Secondary | ICD-10-CM

## 2021-09-28 DIAGNOSIS — B9689 Other specified bacterial agents as the cause of diseases classified elsewhere: Secondary | ICD-10-CM | POA: Diagnosis not present

## 2021-09-28 DIAGNOSIS — N760 Acute vaginitis: Secondary | ICD-10-CM

## 2021-09-28 MED ORDER — METRONIDAZOLE 500 MG PO TABS: 500.0000 mg | ORAL_TABLET | Freq: Two times a day (BID) | ORAL | 0 refills | Status: AC

## 2021-09-28 NOTE — Patient Instructions (Signed)
You were seen today in the [facility] for reevaluation of lupus symptoms and vaginal itching. You were prescribed medications, please take as directed. Please follow up in 3 mths for reevaluation of lupus

## 2021-09-28 NOTE — Progress Notes (Signed)
[place],   [id number] Phone:  [phone]   Fax:  [phone] Subjective:   Patient ID: Rhonda Mcgee, female    DOB: November 08, 1985, 36 y.o.   MRN: [id number]  No chief complaint on file.  HPI Chavie Shoe 36 y.o. female  has a past medical history of Chlamydia, GERD (gastroesophageal reflux disease), Gonorrhea, Herpes genitalia, Lupus ([place]), and Rheumatoid arthritis(714.0). To the [facility] for reevaluation of symptoms discussed at previous visit.  States that symptoms have improved since previous visit, she has been compliant with medications for lupus. Agrees with diagnosis of lupus flare, suspects that its related to increased stressors. Maintains appropriate hydration and diet.   States that she continues to have vaginal itching, was treated in the ED for BV with one dose of flagyl, but did not receive a prescription.   Past Medical History:  Diagnosis Date   Chlamydia    GERD (gastroesophageal reflux disease)    Gonorrhea    Herpes genitalia    Lupus ([place])    Rheumatoid arthritis(714.0)     Past Surgical History:  Procedure Laterality Date   ADENOIDECTOMY     CHOLECYSTECTOMY     DILATION AND CURETTAGE OF UTERUS     elective ab   GALLBLADDER SURGERY     TONSILLECTOMY     TUBAL LIGATION      Family History  Adopted: Yes  Problem Relation Age of Onset   Diabetes Mother    Hypertension Mother    Diabetes Sister    Hypertension Sister    Migraines Sister        with menstrual cycle, had vision loss with migraines   Lupus Maternal Grandmother    Esophageal cancer Neg Hx    Rectal cancer Neg Hx    Stomach cancer Neg Hx    Colon cancer Neg Hx     Social History   Socioeconomic History   Marital status: Single    Spouse name: Not on file   Number of children: 2   Years of education: Not on file   Highest education level: Some college, no degree  Occupational History   Not on file  Tobacco Use    Smoking status: Every Day    Packs/day: 0.50    Years: 10.00    Pack years: 5.00    Types: Cigarettes    Last attempt to quit: 02/2021    Years since quitting: 0.5   Smokeless tobacco: Never  Vaping Use   Vaping Use: Every day   Substances: Nicotine, Flavoring  Substance and Sexual Activity   Alcohol use: Yes    Comment: occasional   Drug use: No   Sexual activity: Yes    Birth control/protection: None, Surgical    Comment: tubal ligation  Other Topics Concern   Not on file  Social History Narrative   Lives at home with her children   Right handed   Caffeine: 32 oz sweet tea/day   Social Determinants of Health   Financial Resource Strain: Not on file  Food Insecurity: Not on file  Transportation Needs: Not on file  Physical Activity: Not on file  Stress: Not on file  Social Connections: Not on file  Intimate Partner Violence: Not on file    Outpatient Medications Prior to Visit  Medication Sig Dispense Refill   albuterol (PROAIR HFA) 108 (90 Base) MCG/ACT inhaler Inhale 1-2 puffs into the lungs every 6 (six) hours as needed for wheezing or  shortness of breath. (Patient not taking: Reported on 09/13/2021) 8 g 1   azithromycin (AZASITE) 1 % ophthalmic solution Place 1 drop into both eyes 2 (two) times daily. (Patient not taking: Reported on 09/13/2021) 2.5 mL 0   DUPIXENT 300 MG/2ML SOPN Inject into the skin.     ferrous sulfate 325 (65 FE) MG tablet Take 1 tablet (325 mg total) by mouth daily. 30 tablet 3   hydroxychloroquine (PLAQUENIL) 200 MG tablet Take 2 tablets (400 mg total) by mouth daily. 180 tablet 1   ketoconazole (NIZORAL) 2 % shampoo Apply topically daily. (Patient not taking: Reported on 09/13/2021)     mupirocin ointment (BACTROBAN) 2 % Apply 1 application. topically 2 (two) times daily. 22 g 0   ondansetron (ZOFRAN-ODT) 4 MG disintegrating tablet '4mg'$  ODT q4 hours prn nausea/vomit 20 tablet 0   pantoprazole (PROTONIX) 40 MG tablet Take by mouth. (Patient not  taking: Reported on 09/13/2021)     promethazine (PHENERGAN) 25 MG tablet Take 1 tablet (25 mg total) by mouth every 8 (eight) hours as needed for nausea or vomiting. 20 tablet 0   tacrolimus (PROTOPIC) 0.1 % ointment Apply topically daily. 100 g 0   triamcinolone ointment (KENALOG) 0.1 % Apply 1 application topically 2 (two) times daily as needed (Rash). Apply to face twice a day x 7 days 30 g 0   No facility-administered medications prior to visit.    Allergies  Allergen Reactions   Cephalexin Hives   Keflex [Cephalexin] Hives    ROS     Objective:    Physical Exam  Wt 210 lb 8 oz (95.5 kg)   LMP 09/13/2021 (Exact Date)   BMI 34.50 kg/m  Wt Readings from Last 3 Encounters:  09/28/21 210 lb 8 oz (95.5 kg)  09/13/21 206 lb 12.8 oz (93.8 kg)  08/13/21 208 lb 6.4 oz (94.5 kg)    Immunization History  Administered Date(s) Administered   PFIZER(Purple Top)SARS-COV-2 Vaccination 07/22/2019, 08/17/2019   PPD Test 06/24/2014, 07/12/2014    Diabetic Foot Exam - Simple   No data filed     Lab Results  Component Value Date   TSH 0.249 (L) 11/22/2020   Lab Results  Component Value Date   WBC 4.1 09/18/2021   HGB 10.9 (L) 09/18/2021   HCT 34.4 (L) 09/18/2021   MCV 82.5 09/18/2021   PLT 281 09/18/2021   Lab Results  Component Value Date   NA 138 09/18/2021   K 3.9 09/18/2021   CO2 24 09/18/2021   GLUCOSE 86 09/18/2021   BUN 6 09/18/2021   CREATININE 0.83 09/18/2021   BILITOT 0.5 09/18/2021   ALKPHOS 74 09/18/2021   AST 16 09/18/2021   ALT 12 09/18/2021   PROT 7.2 09/18/2021   ALBUMIN 3.5 09/18/2021   CALCIUM 8.4 (L) 09/18/2021   ANIONGAP 4 (L) 09/18/2021   No results found for: CHOL No results found for: HDL No results found for: LDLCALC No results found for: TRIG No results found for: CHOLHDL Lab Results  Component Value Date   HGBA1C 5.2 12/03/2018       Assessment & Plan:   Problem List Items Addressed This Visit   None   I am having  Maylen Blanks maintain her pantoprazole, ketoconazole, azithromycin, albuterol, triamcinolone ointment, tacrolimus, hydroxychloroquine, Dupixent, mupirocin ointment, promethazine, ferrous sulfate, and ondansetron.  No orders of the defined types were placed in this encounter.    Teena Dunk, NP

## 2021-10-01 ENCOUNTER — Ambulatory Visit: Payer: Medicaid Other | Admitting: Nurse Practitioner

## 2021-11-01 ENCOUNTER — Encounter: Payer: Medicaid Other | Attending: Physical Medicine & Rehabilitation | Admitting: Physical Medicine & Rehabilitation

## 2021-11-14 ENCOUNTER — Telehealth: Payer: Self-pay

## 2021-11-14 NOTE — Telephone Encounter (Signed)
Patient aware to call [facility] and restart the refills

## 2021-12-06 ENCOUNTER — Ambulatory Visit (INDEPENDENT_AMBULATORY_CARE_PROVIDER_SITE_OTHER): Payer: Medicaid Other | Admitting: Nurse Practitioner

## 2021-12-06 DIAGNOSIS — N760 Acute vaginitis: Secondary | ICD-10-CM

## 2021-12-06 DIAGNOSIS — A64 Unspecified sexually transmitted disease: Secondary | ICD-10-CM | POA: Diagnosis not present

## 2021-12-06 LAB — POCT URINALYSIS DIP (CLINITEK)
Bilirubin, UA: NEGATIVE
Blood, UA: NEGATIVE
Glucose, UA: NEGATIVE mg/dL
Ketones, POC UA: NEGATIVE mg/dL
Leukocytes, UA: NEGATIVE
Nitrite, UA: NEGATIVE
POC PROTEIN,UA: NEGATIVE
Spec Grav, UA: 1.025 (ref 1.010–1.025)
Urobilinogen, UA: 0.2 E.U./dL
pH, UA: 5.5 (ref 5.0–8.0)

## 2021-12-09 LAB — NUSWAB VAGINITIS PLUS (VG+)
Atopobium vaginae: HIGH Score — AB
BVAB 2: HIGH Score — AB
Candida albicans, NAA: NEGATIVE
Candida glabrata, NAA: NEGATIVE
Chlamydia trachomatis, NAA: NEGATIVE
Megasphaera 1: HIGH Score — AB
Neisseria gonorrhoeae, NAA: NEGATIVE
Trich vag by NAA: NEGATIVE

## 2021-12-12 ENCOUNTER — Other Ambulatory Visit: Payer: Self-pay | Admitting: Nurse Practitioner

## 2021-12-12 MED ORDER — METRONIDAZOLE 500 MG PO TABS: 500.0000 mg | ORAL_TABLET | Freq: Two times a day (BID) | ORAL | 0 refills | Status: AC

## 2022-01-15 ENCOUNTER — Emergency Department (HOSPITAL_BASED_OUTPATIENT_CLINIC_OR_DEPARTMENT_OTHER)
Admission: EM | Admit: 2022-01-15 | Discharge: 2022-01-15 | Disposition: A | Discharge status: Home / Self Care | Payer: Medicaid Other | Attending: Emergency Medicine | Admitting: Emergency Medicine

## 2022-01-15 ENCOUNTER — Other Ambulatory Visit: Payer: Self-pay

## 2022-01-15 ENCOUNTER — Encounter (HOSPITAL_BASED_OUTPATIENT_CLINIC_OR_DEPARTMENT_OTHER): Payer: Self-pay

## 2022-01-15 DIAGNOSIS — N898 Other specified noninflammatory disorders of vagina: Secondary | ICD-10-CM | POA: Diagnosis present

## 2022-01-15 DIAGNOSIS — R112 Nausea with vomiting, unspecified: Secondary | ICD-10-CM | POA: Insufficient documentation

## 2022-01-15 DIAGNOSIS — B9689 Other specified bacterial agents as the cause of diseases classified elsewhere: Secondary | ICD-10-CM | POA: Diagnosis not present

## 2022-01-15 DIAGNOSIS — R519 Headache, unspecified: Secondary | ICD-10-CM | POA: Insufficient documentation

## 2022-01-15 DIAGNOSIS — R1030 Lower abdominal pain, unspecified: Secondary | ICD-10-CM | POA: Insufficient documentation

## 2022-01-15 DIAGNOSIS — N760 Acute vaginitis: Secondary | ICD-10-CM | POA: Insufficient documentation

## 2022-01-15 LAB — CBC WITH DIFFERENTIAL/PLATELET
Abs Immature Granulocytes: 0 10*3/uL (ref 0.00–0.07)
Basophils Absolute: 0 10*3/uL (ref 0.0–0.1)
Basophils Relative: 1 %
Eosinophils Absolute: 0.1 10*3/uL (ref 0.0–0.5)
Eosinophils Relative: 2 %
HCT: 39.8 % (ref 36.0–46.0)
Hemoglobin: 12.8 g/dL (ref 12.0–15.0)
Immature Granulocytes: 0 %
Lymphocytes Relative: 63 %
Lymphs Abs: 2.1 10*3/uL (ref 0.7–4.0)
MCH: 26 pg (ref 26.0–34.0)
MCHC: 32.2 g/dL (ref 30.0–36.0)
MCV: 80.9 fL (ref 80.0–100.0)
Monocytes Absolute: 0.3 10*3/uL (ref 0.1–1.0)
Monocytes Relative: 9 %
Neutro Abs: 0.8 10*3/uL — ABNORMAL LOW (ref 1.7–7.7)
Neutrophils Relative %: 25 %
Platelets: 302 10*3/uL (ref 150–400)
RBC: 4.92 MIL/uL (ref 3.87–5.11)
RDW: 15.8 % — ABNORMAL HIGH (ref 11.5–15.5)
WBC: 3.4 10*3/uL — ABNORMAL LOW (ref 4.0–10.5)
nRBC: 0 % (ref 0.0–0.2)

## 2022-01-15 LAB — HEPATIC FUNCTION PANEL
ALT: 15 U/L (ref 0–44)
AST: 21 U/L (ref 15–41)
Albumin: 3.9 g/dL (ref 3.5–5.0)
Alkaline Phosphatase: 76 U/L (ref 38–126)
Bilirubin, Direct: 0.1 mg/dL (ref 0.0–0.2)
Indirect Bilirubin: 0.9 mg/dL (ref 0.3–0.9)
Total Bilirubin: 1 mg/dL (ref 0.3–1.2)
Total Protein: 7.9 g/dL (ref 6.5–8.1)

## 2022-01-15 LAB — URINALYSIS, ROUTINE W REFLEX MICROSCOPIC
Bilirubin Urine: NEGATIVE
Glucose, UA: NEGATIVE mg/dL
Hgb urine dipstick: NEGATIVE
Ketones, ur: NEGATIVE mg/dL
Nitrite: NEGATIVE
Protein, ur: NEGATIVE mg/dL
Specific Gravity, Urine: 1.02 (ref 1.005–1.030)
pH: 5.5 (ref 5.0–8.0)

## 2022-01-15 LAB — URINALYSIS, MICROSCOPIC (REFLEX): RBC / HPF: NONE SEEN RBC/hpf (ref 0–5)

## 2022-01-15 LAB — BASIC METABOLIC PANEL
Anion gap: 6 (ref 5–15)
BUN: 8 mg/dL (ref 6–20)
CO2: 22 mmol/L (ref 22–32)
Calcium: 8.6 mg/dL — ABNORMAL LOW (ref 8.9–10.3)
Chloride: 108 mmol/L (ref 98–111)
Creatinine, Ser: 0.79 mg/dL (ref 0.44–1.00)
GFR, Estimated: >60 mL/min (ref >60)
Glucose, Bld: 79 mg/dL (ref 70–99)
Potassium: 3.6 mmol/L (ref 3.5–5.1)
Sodium: 136 mmol/L (ref 135–145)

## 2022-01-15 LAB — LIPASE, BLOOD: Lipase: 29 U/L (ref 11–51)

## 2022-01-15 LAB — WET PREP, GENITAL
Sperm: NONE SEEN
Trich, Wet Prep: NONE SEEN
WBC, Wet Prep HPF POC: <10 (ref <10)
Yeast Wet Prep HPF POC: NONE SEEN

## 2022-01-15 LAB — HCG, SERUM, QUALITATIVE: Preg, Serum: NEGATIVE

## 2022-01-15 MED ORDER — KETOROLAC TROMETHAMINE 15 MG/ML IJ SOLN
15.0000 mg | Freq: Once | INTRAMUSCULAR | Status: AC
  Administered 2022-01-15: 15 mg via INTRAVENOUS
  Filled 2022-01-15: qty 1

## 2022-01-15 MED ORDER — CLINDAMYCIN HCL 300 MG PO CAPS: 300.0000 mg | ORAL_CAPSULE | Freq: Two times a day (BID) | ORAL | 0 refills | Status: AC

## 2022-01-15 MED ORDER — ONDANSETRON HCL 4 MG/2ML IJ SOLN
4.0000 mg | Freq: Once | INTRAMUSCULAR | Status: AC
  Administered 2022-01-15: 4 mg via INTRAVENOUS
  Filled 2022-01-15: qty 2

## 2022-01-15 MED ORDER — SODIUM CHLORIDE 0.9 % IV BOLUS
1000.0000 mL | Freq: Once | INTRAVENOUS | Status: AC
  Administered 2022-01-15: 1000 mL via INTRAVENOUS

## 2022-01-15 NOTE — ED Triage Notes (Signed)
Pt to ED c/o migraine HA x 1 week. HX of migraines. No relief with OTC medications. Reports malaise , being treated with iron supplement.

## 2022-01-15 NOTE — Discharge Instructions (Signed)
Follow up with your OBGYN. No sexual activity until clear test!

## 2022-01-15 NOTE — ED Provider Notes (Signed)
[facility] EMERGENCY DEPARTMENT Provider Note   CSN: [id number] Arrival date & time: 01/15/22  1129     History {Add pertinent medical, surgical, social history, OB history to HPI:1} Chief Complaint  Patient presents with   Headache   Fatigue    Rhonda Mcgee is a 36 y.o. female.  Pt is a 36 yo female with pmh of migraines presenting for headache. Pt states she has a severe headache, located near the frontal region, sensitivity to light, nausea without vomiting, x 1 week.   Pt admits to recent dx of bacterial vaginosis after workup for vaginal discharge. States she was negative for STIs. Pt states she was treated with flagyl x 7 days, had recurrent symptoms, tested positive again, and received a second week of flagyl treatment. Pt states during her second treatment she developed lower abdominal pain, nausea and vomiting, and headache. Admits to continued vaginal odor and new vaginal swelling x 1 week.   The history is provided by the patient. No language interpreter was used.  Headache Associated symptoms: abdominal pain   Associated symptoms: no back pain, no cough, no ear pain, no eye pain, no fever, no seizures, no sore throat and no vomiting        Home Medications Prior to Admission medications   Medication Sig Start Date End Date Taking? Authorizing Provider  albuterol (PROAIR HFA) 108 (90 Base) MCG/ACT inhaler Inhale 1-2 puffs into the lungs every 6 (six) hours as needed for wheezing or shortness of breath. Patient not taking: Reported on 09/13/2021 11/22/20 11/22/21  [name], NP  DUPIXENT 300 MG/2ML SOPN Inject into the skin. 07/23/21   [provider]  ferrous sulfate 325 (65 FE) MG tablet Take 1 tablet (325 mg total) by mouth daily. 09/13/21 09/13/22  Bo Merino I, NP  hydroxychloroquine (PLAQUENIL) 200 MG tablet Take 2 tablets (400 mg total) by mouth daily. 06/23/21   Collier Salina, MD  ketoconazole (NIZORAL) 2 % shampoo Apply  topically daily. Patient not taking: Reported on 09/13/2021 06/07/20   [provider]  mupirocin ointment (BACTROBAN) 2 % Apply 1 application. topically 2 (two) times daily. 08/13/21   Fenton Foy, NP  ondansetron (ZOFRAN) 4 MG tablet Take 4 mg by mouth every 8 (eight) hours as needed. 09/13/21   [provider]  ondansetron (ZOFRAN-ODT) 4 MG disintegrating tablet '4mg'$  ODT q4 hours prn nausea/vomit 09/18/21   Deno Etienne, DO  pantoprazole (PROTONIX) 40 MG tablet Take by mouth. Patient not taking: Reported on 09/13/2021 03/04/20   [provider]  promethazine (PHENERGAN) 25 MG tablet Take 1 tablet (25 mg total) by mouth every 8 (eight) hours as needed for nausea or vomiting. 09/13/21   Passmore, Jake Church I, NP  tacrolimus (PROTOPIC) 0.1 % ointment Apply topically daily. 02/08/21   Sheffield, Ronalee Red, PA-C  triamcinolone ointment (KENALOG) 0.1 % Apply 1 application topically 2 (two) times daily as needed (Rash). Apply to face twice a day x 7 days 02/08/21   Warren Danes, PA-C      Allergies    Cephalexin and Keflex [cephalexin]    Review of Systems   Review of Systems  Constitutional:  Negative for chills and fever.  HENT:  Negative for ear pain and sore throat.   Eyes:  Negative for pain and visual disturbance.  Respiratory:  Negative for cough and shortness of breath.   Cardiovascular:  Negative for chest pain and palpitations.  Gastrointestinal:  Positive for abdominal pain. Negative for  vomiting.  Genitourinary:  Negative for dysuria and hematuria.  Musculoskeletal:  Negative for arthralgias and back pain.  Skin:  Negative for color change and rash.  Neurological:  Positive for headaches. Negative for seizures and syncope.  All other systems reviewed and are negative.   Physical Exam Updated Vital Signs BP 115/79   Pulse 67   Temp 99 F (37.2 C) (Oral)   Resp 17   Ht '5\' 5"'$  (1.651 m)   Wt 88.5 kg   SpO2 100%   BMI 32.45 kg/m  Physical Exam Vitals  and nursing note reviewed.  Constitutional:      General: She is not in acute distress.    Appearance: She is well-developed.  HENT:     Head: Normocephalic and atraumatic.  Eyes:     Conjunctiva/sclera: Conjunctivae normal.  Cardiovascular:     Rate and Rhythm: Normal rate and regular rhythm.     Heart sounds: No murmur heard. Pulmonary:     Effort: Pulmonary effort is normal. No respiratory distress.     Breath sounds: Normal breath sounds.  Abdominal:     Palpations: Abdomen is soft.     Tenderness: There is no abdominal tenderness.  Musculoskeletal:        General: No swelling.     Cervical back: Neck supple.  Skin:    General: Skin is warm and dry.     Capillary Refill: Capillary refill takes less than 2 seconds.  Neurological:     Mental Status: She is alert.  Psychiatric:        Mood and Affect: Mood normal.     ED Results / Procedures / Treatments   Labs (all labs ordered are listed, but only abnormal results are displayed) Labs Reviewed  CBC WITH DIFFERENTIAL/PLATELET - Abnormal; Notable for the following components:      Result Value   WBC 3.4 (*)    RDW 15.8 (*)    Neutro Abs 0.8 (*)    All other components within normal limits  BASIC METABOLIC PANEL - Abnormal; Notable for the following components:   Calcium 8.6 (*)    All other components within normal limits  PATHOLOGIST SMEAR REVIEW    EKG None  Radiology No results found.  Procedures Procedures  {Document cardiac monitor, telemetry assessment procedure when appropriate:1}  Medications Ordered in ED Medications - No data to display  ED Course/ Medical Decision Making/ A&P                           Medical Decision Making Amount and/or Complexity of Data Reviewed Labs: ordered.  Risk Prescription drug management.   ***  {Document critical care time when appropriate:1} {Document review of labs and clinical decision tools ie heart score, Chads2Vasc2 etc:1}  {Document your  independent review of radiology images, and any outside records:1} {Document your discussion with family members, caretakers, and with consultants:1} {Document social determinants of health affecting pt's care:1} {Document your decision making why or why not admission, treatments were needed:1} Final Clinical Impression(s) / ED Diagnoses Final diagnoses:  None    Rx / DC Orders ED Discharge Orders     None

## 2022-01-16 LAB — PATHOLOGIST SMEAR REVIEW

## 2022-01-17 LAB — GC/CHLAMYDIA PROBE AMP (~~LOC~~) NOT AT ARMC
Chlamydia: NEGATIVE
Comment: NEGATIVE
Comment: NORMAL
Neisseria Gonorrhea: NEGATIVE

## 2022-02-07 ENCOUNTER — Ambulatory Visit: Payer: Medicaid Other | Admitting: Physician Assistant

## 2022-02-19 ENCOUNTER — Emergency Department (HOSPITAL_BASED_OUTPATIENT_CLINIC_OR_DEPARTMENT_OTHER)
Admission: EM | Admit: 2022-02-19 | Discharge: 2022-02-19 | Disposition: A | Discharge status: Home / Self Care | Payer: Medicaid Other | Attending: Emergency Medicine | Admitting: Emergency Medicine

## 2022-02-19 ENCOUNTER — Encounter (HOSPITAL_BASED_OUTPATIENT_CLINIC_OR_DEPARTMENT_OTHER): Payer: Self-pay | Admitting: Pediatrics

## 2022-02-19 ENCOUNTER — Other Ambulatory Visit: Payer: Self-pay

## 2022-02-19 DIAGNOSIS — R82998 Other abnormal findings in urine: Secondary | ICD-10-CM | POA: Insufficient documentation

## 2022-02-19 DIAGNOSIS — R197 Diarrhea, unspecified: Secondary | ICD-10-CM | POA: Diagnosis not present

## 2022-02-19 DIAGNOSIS — R350 Frequency of micturition: Secondary | ICD-10-CM | POA: Diagnosis not present

## 2022-02-19 DIAGNOSIS — R079 Chest pain, unspecified: Secondary | ICD-10-CM | POA: Insufficient documentation

## 2022-02-19 DIAGNOSIS — R109 Unspecified abdominal pain: Secondary | ICD-10-CM | POA: Diagnosis not present

## 2022-02-19 DIAGNOSIS — Z20822 Contact with and (suspected) exposure to covid-19: Secondary | ICD-10-CM | POA: Diagnosis not present

## 2022-02-19 DIAGNOSIS — R059 Cough, unspecified: Secondary | ICD-10-CM | POA: Insufficient documentation

## 2022-02-19 DIAGNOSIS — R110 Nausea: Secondary | ICD-10-CM | POA: Insufficient documentation

## 2022-02-19 DIAGNOSIS — R0789 Other chest pain: Secondary | ICD-10-CM | POA: Diagnosis not present

## 2022-02-19 DIAGNOSIS — R8290 Unspecified abnormal findings in urine: Secondary | ICD-10-CM

## 2022-02-19 LAB — URINALYSIS, ROUTINE W REFLEX MICROSCOPIC
Bilirubin Urine: NEGATIVE
Glucose, UA: NEGATIVE mg/dL
Hgb urine dipstick: NEGATIVE
Ketones, ur: NEGATIVE mg/dL
Leukocytes,Ua: NEGATIVE
Nitrite: NEGATIVE
Protein, ur: NEGATIVE mg/dL
Specific Gravity, Urine: 1.01 (ref 1.005–1.030)
pH: 6.5 (ref 5.0–8.0)

## 2022-02-19 LAB — RESP PANEL BY RT-PCR (FLU A&B, COVID) ARPGX2
Influenza A by PCR: NEGATIVE
Influenza B by PCR: NEGATIVE
SARS Coronavirus 2 by RT PCR: NEGATIVE

## 2022-02-19 MED ORDER — PHENAZOPYRIDINE HCL 200 MG PO TABS: 200.0000 mg | ORAL_TABLET | Freq: Three times a day (TID) | ORAL | 0 refills | Status: AC

## 2022-02-19 NOTE — Discharge Instructions (Signed)
Please read and follow all provided instructions.  Your diagnoses today include:  1. Left sided abdominal pain   2. Left-sided chest pain   3. Abnormal urine odor     Tests performed today include: Urine test: Did not show any signs of a urine infection or other problems COVID testing: Was negative Vital signs. See below for your results today.   Medications prescribed:  Pyridium - medication for urinary tract infection symptoms.   This medication will turn your urine orange. This is normal.   Take any prescribed medications only as directed.  Home care instructions:  Follow any educational materials contained in this packet.  BE VERY CAREFUL not to take multiple medicines containing Tylenol (also called acetaminophen). Doing so can lead to an overdose which can damage your liver and cause liver failure and possibly death.   Follow-up instructions: Please follow-up with your primary care provider in the next 3 days for further evaluation of your symptoms.   Return instructions:  Please return to the Emergency Department if you experience worsening symptoms.  Please return if you have any other emergent concerns.  Additional Information:  Your vital signs today were: BP (!) 134/99 (BP Location: Left Arm)   Pulse 68   Temp 98.2 F (36.8 C) (Oral)   Resp 20   Ht '5\' 5"'$  (1.651 m)   Wt 86.2 kg   SpO2 100%   BMI 31.62 kg/m  If your blood pressure (BP) was elevated above 135/85 this visit, please have this repeated by your doctor within one month. --------------

## 2022-02-19 NOTE — ED Notes (Signed)
Patient reports she is having left sided mid abd pain that radiates into her chest.  She states she has chest pain that occurs when she is coughing.  Patient also reports she has had productive cough with thick mucous.  Patient denies any fevers.  Patient reports she has had some nausea.  Patient reports diarrhea as well x 3.  She completed a course of antibiotic recently for BVD.  She denies any new vaginal discharge.  She states she notices an odor when she is voiding.  Lungs are clear on exam.

## 2022-02-19 NOTE — ED Notes (Signed)
Patient verbalized understanding of discharge instructions and reasons to return to the ED 

## 2022-02-19 NOTE — ED Triage Notes (Signed)
C/O cough and chest pains x 2-3 days; c/o left flank pain as well radiating to the front; reports urine frequency has increased along w/ strong odor; denies painful urination.

## 2022-02-19 NOTE — ED Provider Notes (Signed)
[facility] EMERGENCY DEPARTMENT Provider Note   CSN: [id number] Arrival date & time: 02/19/22  1727     History  Chief Complaint  Patient presents with   Cough   Flank Pain    Rhonda Mcgee is a 36 y.o. female.  Patient with history of lupus on Plaquenil presents today for evaluation of left-sided chest pain, left-sided abdominal pain as well as a strong odor to her urine.  Patient states that her symptoms have been ongoing for several weeks.  She was actually seen here on 01/15/2022 and diagnosed with BV.  She states that symptoms started after taking medication for that.  No trouble breathing or fever.  She has had a cough at times that is productive.  Reports some nausea but no vomiting.  Occasional diarrhea reported.  She denies dysuria or hematuria.  Has had some increased frequency.  Denies injuries.       Home Medications Prior to Admission medications   Medication Sig Start Date End Date Taking? Authorizing Provider  phenazopyridine (PYRIDIUM) 200 MG tablet Take 1 tablet (200 mg total) by mouth 3 (three) times daily. 02/19/22  Yes Carlisle Cater, PA-C  albuterol (PROAIR HFA) 108 (90 Base) MCG/ACT inhaler Inhale 1-2 puffs into the lungs every 6 (six) hours as needed for wheezing or shortness of breath. Patient not taking: Reported on 09/13/2021 11/22/20 11/22/21  [name], NP  clindamycin (CLEOCIN) 300 MG capsule Take 1 capsule (300 mg total) by mouth in the morning and at bedtime. 09/13/98   Campbell Stall P, DO  DUPIXENT 300 MG/2ML SOPN Inject into the skin. 07/23/21   [provider]  ferrous sulfate 325 (65 FE) MG tablet Take 1 tablet (325 mg total) by mouth daily. 09/13/21 09/13/22  Bo Merino I, NP  hydroxychloroquine (PLAQUENIL) 200 MG tablet Take 2 tablets (400 mg total) by mouth daily. 06/23/21   Collier Salina, MD  ketoconazole (NIZORAL) 2 % shampoo Apply topically daily. Patient not taking: Reported on 09/13/2021 06/07/20   [provider]  mupirocin ointment (BACTROBAN) 2 % Apply 1 application. topically 2 (two) times daily. 08/13/21   Fenton Foy, NP  ondansetron (ZOFRAN) 4 MG tablet Take 4 mg by mouth every 8 (eight) hours as needed. 09/13/21   [provider]  ondansetron (ZOFRAN-ODT) 4 MG disintegrating tablet '4mg'$  ODT q4 hours prn nausea/vomit 09/18/21   Deno Etienne, DO  pantoprazole (PROTONIX) 40 MG tablet Take by mouth. Patient not taking: Reported on 09/13/2021 03/04/20   [provider]  promethazine (PHENERGAN) 25 MG tablet Take 1 tablet (25 mg total) by mouth every 8 (eight) hours as needed for nausea or vomiting. 09/13/21   Passmore, Jake Church I, NP  tacrolimus (PROTOPIC) 0.1 % ointment Apply topically daily. 02/08/21   Sheffield, Ronalee Red, PA-C  triamcinolone ointment (KENALOG) 0.1 % Apply 1 application topically 2 (two) times daily as needed (Rash). Apply to face twice a day x 7 days 02/08/21   Warren Danes, PA-C      Allergies    Cephalexin and Keflex [cephalexin]    Review of Systems   Review of Systems  Physical Exam Updated Vital Signs BP (!) 134/99 (BP Location: Left Arm)   Pulse 68   Temp 98.2 F (36.8 C) (Oral)   Resp 20   Ht '5\' 5"'$  (1.651 m)   Wt 86.2 kg   SpO2 100%   BMI 31.62 kg/m  Physical Exam Vitals and nursing note reviewed.  Constitutional:  General: She is not in acute distress.    Appearance: She is well-developed. She is not diaphoretic.  HENT:     Head: Normocephalic and atraumatic.     Right Ear: External ear normal.     Left Ear: External ear normal.     Nose: Nose normal.     Mouth/Throat:     Mouth: Mucous membranes are not dry.  Eyes:     Conjunctiva/sclera: Conjunctivae normal.  Neck:     Vascular: Normal carotid pulses. No JVD.     Trachea: Trachea normal. No tracheal deviation.  Cardiovascular:     Rate and Rhythm: Normal rate and regular rhythm.     Pulses: No decreased pulses.          Radial pulses are 2+ on the right side  and 2+ on the left side.     Heart sounds: Normal heart sounds, S1 normal and S2 normal. No murmur heard. Pulmonary:     Effort: Pulmonary effort is normal. No respiratory distress.     Breath sounds: No wheezing, rhonchi or rales.  Chest:     Chest wall: No tenderness.  Abdominal:     General: Bowel sounds are normal.     Palpations: Abdomen is soft.     Tenderness: There is abdominal tenderness. There is no guarding or rebound.     Comments: Mild left-sided abdominal tenderness without rebound or guarding  Musculoskeletal:        General: Normal range of motion.     Cervical back: Normal range of motion and neck supple. No muscular tenderness.     Right lower leg: No edema.     Left lower leg: No edema.  Skin:    General: Skin is warm and dry.     Coloration: Skin is not pale.     Findings: No rash.  Neurological:     General: No focal deficit present.     Mental Status: She is alert. Mental status is at baseline.     Motor: No weakness.  Psychiatric:        Mood and Affect: Mood normal.     ED Results / Procedures / Treatments   Labs (all labs ordered are listed, but only abnormal results are displayed) Labs Reviewed  RESP PANEL BY RT-PCR (FLU A&B, COVID) ARPGX2  URINALYSIS, ROUTINE W REFLEX MICROSCOPIC    EKG None  Radiology No results found.  Procedures Procedures    Medications Ordered in ED Medications - No data to display  ED Course/ Medical Decision Making/ A&P    Patient seen and examined. History obtained directly from patient.  Reviewed previous ED visit.  Work-up including labs, imaging, EKG ordered in triage, if performed, were reviewed.    Labs/EKG: Independently reviewed and interpreted.  This included: UA negative, COVID-negative.  Imaging: None ordered  Medications/Fluids: None ordered  Most recent vital signs reviewed and are as follows: BP (!) 134/99 (BP Location: Left Arm)   Pulse 68   Temp 98.2 F (36.8 C) (Oral)   Resp 20   Ht  '5\' 5"'$  (1.651 m)   Wt 86.2 kg   SpO2 100%   BMI 31.62 kg/m   Initial impression: Left-sided chest pain, flank pain, urinary symptoms  8:50 PM I had a discussion with the patient on how much work-up to do tonight.  I did offer lab work to evaluate for possibility of other problems, although admittedly her symptoms have been ongoing for about 1 month now.  She  states that she would prefer to follow-up with her primary care doctor given that her urine test, which was her main concern, is negative.  I think this is reasonable.  Reviewed pertinent lab work and imaging with patient at bedside. Questions answered.   Plan: Discharge to home.   Prescriptions written for: Pyridium, to use this if this improves urinary symptoms.  Other home care instructions discussed: Criss Rosales diet, avoidance of triggers, continue home meds  ED return instructions discussed: The patient was urged to return to the Emergency Department immediately with worsening of current symptoms, worsening abdominal pain, persistent vomiting, blood noted in stools, fever, or any other concerns. The patient verbalized understanding.   Follow-up instructions discussed: Patient encouraged to follow-up with their PCP in 3 days.                            Medical Decision Making Amount and/or Complexity of Data Reviewed Labs: ordered.  Risk Prescription drug management.   For this patient's complaint of abdominal pain, the following conditions were considered on the differential diagnosis: gastritis/PUD, enteritis/duodenitis, appendicitis, cholelithiasis/cholecystitis, cholangitis, pancreatitis, ruptured viscus, colitis, diverticulitis, small/large bowel obstruction, proctitis, cystitis, pyelonephritis, ureteral colic, aortic dissection, aortic aneurysm. In women, ectopic pregnancy, pelvic inflammatory disease, ovarian cysts, and tubo-ovarian abscess were also considered. Atypical chest etiologies were also considered including ACS,  PE, and pneumonia.   No signs of UTI.  Patient also has some chest pain and cough, but lungs are clear on exam.  Tenderness is reproducible with palpation.  Low concern for ACS, PE, dissection, pneumonia or pneumothorax.  The patient's vital signs, pertinent lab work and imaging were reviewed and interpreted as discussed in the ED course. Hospitalization was considered for further testing, treatments, or serial exams/observation. However as patient is well-appearing, has a stable exam, and reassuring studies today, I do not feel that they warrant admission at this time. This plan was discussed with the patient who verbalizes agreement and comfort with this plan and seems reliable and able to return to the Emergency Department with worsening or changing symptoms.          Final Clinical Impression(s) / ED Diagnoses Final diagnoses:  Left sided abdominal pain  Left-sided chest pain  Abnormal urine odor    Rx / DC Orders ED Discharge Orders          Ordered    phenazopyridine (PYRIDIUM) 200 MG tablet  3 times daily        02/19/22 2042              Carlisle Cater, Hershal Coria 02/19/22 2051    Ezequiel Essex, MD 02/20/22 [phone]

## 2022-03-31 ENCOUNTER — Emergency Department (HOSPITAL_BASED_OUTPATIENT_CLINIC_OR_DEPARTMENT_OTHER)
Admission: EM | Admit: 2022-03-31 | Discharge: 2022-03-31 | Disposition: A | Discharge status: Home / Self Care | Payer: Medicaid Other | Attending: Emergency Medicine | Admitting: Emergency Medicine

## 2022-03-31 ENCOUNTER — Other Ambulatory Visit: Payer: Self-pay

## 2022-03-31 DIAGNOSIS — N898 Other specified noninflammatory disorders of vagina: Secondary | ICD-10-CM | POA: Insufficient documentation

## 2022-03-31 LAB — WET PREP, GENITAL
Clue Cells Wet Prep HPF POC: NONE SEEN
Sperm: NONE SEEN
Trich, Wet Prep: NONE SEEN
WBC, Wet Prep HPF POC: >=10 — AB (ref <10)
Yeast Wet Prep HPF POC: NONE SEEN

## 2022-03-31 NOTE — ED Notes (Signed)
Unable to provide u/a. Given po fluids

## 2022-03-31 NOTE — ED Triage Notes (Signed)
Pt POV ambulatory with steady gait-  C/o vaginal itching x1 wk. Hx of BV reports it feels similar to this.

## 2022-03-31 NOTE — Discharge Instructions (Addendum)
Follow-up with an OB/GYN I given you the information for a office to contact.  This particular office has contacted different OB/GYN offices and can help set you up with an appointment.  Use either Vaseline, Aquaphor, Eucerin

## 2022-03-31 NOTE — ED Provider Notes (Signed)
[place] EMERGENCY DEPARTMENT Provider Note   CSN: [id number] Arrival date & time: 03/31/22  1248     History  Chief Complaint  Patient presents with   Vaginal Itching    Rhonda Mcgee is a 36 y.o. female.   Vaginal Itching  Patient is a 36 year old female present emergency room today with complaints of vaginal itching times that she has had this symptom for approximately 1 week.  She states that she is monogamous with a single female partner.  They do not use protection but she is not concerned about STDs.     CC vaginal pain. Patient states external vaginal area is pruritic and painful as of 1 week ago and that is feels different than usual BV symptoms. Patient reports roommate changed their usual detergent and has been having symptoms in external vaginal and perianal areas since. Pain is described as burning and 8.5/10 on pain scale. No aggravating factors noted. Pruritus slightly alleviated by unknown antihistamine. Patient denies dysuria, hematuria, "internal" vaginal pain, diarrhea, headache, nausea, vomiting, abdominal pain, pelvic pain.     Home Medications Prior to Admission medications   Medication Sig Start Date End Date Taking? Authorizing Provider  albuterol (PROAIR HFA) 108 (90 Base) MCG/ACT inhaler Inhale 1-2 puffs into the lungs every 6 (six) hours as needed for wheezing or shortness of breath. Patient not taking: Reported on 09/13/2021 11/22/20 11/22/21  [name], NP  clindamycin (CLEOCIN) 300 MG capsule Take 1 capsule (300 mg total) by mouth in the morning and at bedtime. 2/75/17   Campbell Stall P, DO  DUPIXENT 300 MG/2ML SOPN Inject into the skin. 07/23/21   [provider]  ferrous sulfate 325 (65 FE) MG tablet Take 1 tablet (325 mg total) by mouth daily. 09/13/21 09/13/22  Bo Merino I, NP  hydroxychloroquine (PLAQUENIL) 200 MG tablet Take 2 tablets (400 mg total) by mouth daily. 06/23/21   Collier Salina, MD  ketoconazole  (NIZORAL) 2 % shampoo Apply topically daily. Patient not taking: Reported on 09/13/2021 06/07/20   [provider]  mupirocin ointment (BACTROBAN) 2 % Apply 1 application. topically 2 (two) times daily. 08/13/21   Fenton Foy, NP  ondansetron (ZOFRAN) 4 MG tablet Take 4 mg by mouth every 8 (eight) hours as needed. 09/13/21   [provider]  ondansetron (ZOFRAN-ODT) 4 MG disintegrating tablet '4mg'$  ODT q4 hours prn nausea/vomit 09/18/21   Deno Etienne, DO  pantoprazole (PROTONIX) 40 MG tablet Take by mouth. Patient not taking: Reported on 09/13/2021 03/04/20   [provider]  phenazopyridine (PYRIDIUM) 200 MG tablet Take 1 tablet (200 mg total) by mouth 3 (three) times daily. 02/19/22   Carlisle Cater, PA-C  promethazine (PHENERGAN) 25 MG tablet Take 1 tablet (25 mg total) by mouth every 8 (eight) hours as needed for nausea or vomiting. 09/13/21   Passmore, Jake Church I, NP  tacrolimus (PROTOPIC) 0.1 % ointment Apply topically daily. 02/08/21   Sheffield, Ronalee Red, PA-C  triamcinolone ointment (KENALOG) 0.1 % Apply 1 application topically 2 (two) times daily as needed (Rash). Apply to face twice a day x 7 days 02/08/21   Warren Danes, PA-C      Allergies    Cephalexin and Keflex [cephalexin]    Review of Systems   Review of Systems  Physical Exam Updated Vital Signs BP 122/64   Pulse 74   Temp 98.6 F (37 C) (Oral)   Resp 18   Ht '5\' 5"'$  (1.651 m)   Wt  88.5 kg   SpO2 100%   BMI 32.45 kg/m  Physical Exam Vitals and nursing note reviewed.  Constitutional:      General: She is not in acute distress.    Appearance: Normal appearance. She is not ill-appearing.  HENT:     Head: Normocephalic and atraumatic.  Eyes:     General: No scleral icterus.       Right eye: No discharge.        Left eye: No discharge.     Conjunctiva/sclera: Conjunctivae normal.  Pulmonary:     Effort: Pulmonary effort is normal.     Breath sounds: No stridor.  Genitourinary:     Comments: Vulva without lesions or abnormality Vaginal canal without abnormal discharge or lesion Cervix appears normal, is closed No adnexal tenderness or CMT   No significant rashes or external lesions of the vulva Neurological:     Mental Status: She is alert and oriented to person, place, and time. Mental status is at baseline.     ED Results / Procedures / Treatments   Labs (all labs ordered are listed, but only abnormal results are displayed) Labs Reviewed  WET PREP, GENITAL - Abnormal; Notable for the following components:      Result Value   WBC, Wet Prep HPF POC >=10 (*)    All other components within normal limits  GC/CHLAMYDIA PROBE AMP ([place]) NOT AT [facility]    EKG None  Radiology No results found.  Procedures Procedures    Medications Ordered in ED Medications - No data to display  ED Course/ Medical Decision Making/ A&P                           Medical Decision Making   Patient is a 36 year old female present emergency room today with complaints of vaginal itching times that she has had this symptom for approximately 1 week.  She states that she is monogamous with a single female partner.  They do not use protection but she is not concerned about STDs.     CC vaginal pain. Patient states external vaginal area is pruritic and painful as of 1 week ago and that is feels different than usual BV symptoms. Patient reports roommate changed their usual detergent and has been having symptoms in external vaginal and perianal areas since. Pain is described as burning and 8.5/10 on pain scale. No aggravating factors noted. Pruritus slightly alleviated by unknown antihistamine. Patient denies dysuria, hematuria, "internal" vaginal pain, diarrhea, headache, nausea, vomiting, abdominal pain, pelvic pain.   Physical exam unremarkable  Normal vital signs, well-appearing.  She will hold off on using the detergent that seems to cause the issue.  I recommended some  Vaseline, Eucerin, Aquaphor or similar product.  Follow-up with OB/GYN.  Return precautions discussed.  Gonorrhea chlamydia probe obtained.  Blood prep unremarkable  Final Clinical Impression(s) / ED Diagnoses Final diagnoses:  Vaginal irritation    Rx / DC Orders ED Discharge Orders     None         Tedd Sias, [place] 03/31/22 2013    Deno Etienne, DO 03/31/22 2109

## 2022-04-01 LAB — GC/CHLAMYDIA PROBE AMP (~~LOC~~) NOT AT ARMC
Chlamydia: NEGATIVE
Comment: NEGATIVE
Comment: NORMAL
Neisseria Gonorrhea: NEGATIVE

## 2022-04-23 ENCOUNTER — Emergency Department (HOSPITAL_BASED_OUTPATIENT_CLINIC_OR_DEPARTMENT_OTHER)
Admission: EM | Admit: 2022-04-23 | Discharge: 2022-04-23 | Disposition: A | Discharge status: Home / Self Care | Payer: Medicaid Other | Attending: Emergency Medicine | Admitting: Emergency Medicine

## 2022-04-23 ENCOUNTER — Emergency Department (HOSPITAL_BASED_OUTPATIENT_CLINIC_OR_DEPARTMENT_OTHER): Payer: Medicaid Other

## 2022-04-23 DIAGNOSIS — U071 COVID-19: Secondary | ICD-10-CM | POA: Insufficient documentation

## 2022-04-23 DIAGNOSIS — J111 Influenza due to unidentified influenza virus with other respiratory manifestations: Secondary | ICD-10-CM | POA: Diagnosis not present

## 2022-04-23 DIAGNOSIS — J101 Influenza due to other identified influenza virus with other respiratory manifestations: Secondary | ICD-10-CM | POA: Insufficient documentation

## 2022-04-23 DIAGNOSIS — R059 Cough, unspecified: Secondary | ICD-10-CM | POA: Diagnosis not present

## 2022-04-23 LAB — RESP PANEL BY RT-PCR (RSV, FLU A&B, COVID)  RVPGX2
Influenza A by PCR: POSITIVE — AB
Influenza B by PCR: NEGATIVE
Resp Syncytial Virus by PCR: NEGATIVE
SARS Coronavirus 2 by RT PCR: POSITIVE — AB

## 2022-04-23 MED ORDER — ONDANSETRON HCL 4 MG PO TABS: 4.0000 mg | ORAL_TABLET | Freq: Four times a day (QID) | ORAL | 0 refills | Status: DC

## 2022-04-23 MED ORDER — ALBUTEROL SULFATE HFA 108 (90 BASE) MCG/ACT IN AERS: 1.0000 | INHALATION_SPRAY | Freq: Four times a day (QID) | RESPIRATORY_TRACT | 0 refills | Status: DC | PRN

## 2022-04-23 MED ORDER — BENZONATATE 100 MG PO CAPS: 100.0000 mg | ORAL_CAPSULE | Freq: Three times a day (TID) | ORAL | 0 refills | Status: AC

## 2022-04-23 MED ORDER — BENZONATATE 100 MG PO CAPS: 100.0000 mg | ORAL_CAPSULE | Freq: Three times a day (TID) | ORAL | 0 refills | Status: DC

## 2022-04-23 NOTE — ED Triage Notes (Signed)
Pt here from home with c/o cough congestion and some fevers at home since the weekend

## 2022-04-23 NOTE — ED Provider Notes (Signed)
[facility] Provider Note   CSN: [id number] Arrival date & time: 04/23/22  1212     History  Chief Complaint  Patient presents with   Nasal Congestion    Rhonda Mcgee is a 36 y.o. female with a past medical history of GERD, lupus, rheumatoid arthritis presenting to the Emergency Department for evaluation of cold symptoms.  Patient reports she has had a cough, nasal congestion and a fever at home since the weekend. Patient reports she has body ache all over and feeling very weak. No known sick contacts.  Patient reports chest pain with cough and shortness of breath.  She has tried TheraFlu at home with no improvement.  HPI    Past Medical History:  Diagnosis Date   Chlamydia    GERD (gastroesophageal reflux disease)    Gonorrhea    Herpes genitalia    Lupus ([place])    Rheumatoid arthritis(714.0)    Past Surgical History:  Procedure Laterality Date   ADENOIDECTOMY     CHOLECYSTECTOMY     DILATION AND CURETTAGE OF UTERUS     elective ab   GALLBLADDER SURGERY     TONSILLECTOMY     TUBAL LIGATION       Home Medications Prior to Admission medications   Medication Sig Start Date End Date Taking? Authorizing Provider  albuterol (VENTOLIN HFA) 108 (90 Base) MCG/ACT inhaler Inhale 1-2 puffs into the lungs every 6 (six) hours as needed for wheezing or shortness of breath. 04/23/22  Yes Rex Kras, PA  benzonatate (TESSALON) 100 MG capsule Take 1 capsule (100 mg total) by mouth every 8 (eight) hours. 04/23/22  Yes Rex Kras, PA  ondansetron (ZOFRAN) 4 MG tablet Take 1 tablet (4 mg total) by mouth every 6 (six) hours. 04/23/22  Yes Rex Kras, PA  albuterol (PROAIR HFA) 108 (90 Base) MCG/ACT inhaler Inhale 1-2 puffs into the lungs every 6 (six) hours as needed for wheezing or shortness of breath. Patient not taking: Reported on 09/13/2021 11/22/20 11/22/21  [name], NP  clindamycin (CLEOCIN) 300 MG capsule Take 1 capsule (300 mg total) by mouth in  the morning and at bedtime. 8/41/32   Campbell Stall P, DO  DUPIXENT 300 MG/2ML SOPN Inject into the skin. 07/23/21   [provider]  ferrous sulfate 325 (65 FE) MG tablet Take 1 tablet (325 mg total) by mouth daily. 09/13/21 09/13/22  Bo Merino I, NP  hydroxychloroquine (PLAQUENIL) 200 MG tablet Take 2 tablets (400 mg total) by mouth daily. 06/23/21   Collier Salina, MD  ketoconazole (NIZORAL) 2 % shampoo Apply topically daily. Patient not taking: Reported on 09/13/2021 06/07/20   [provider]  mupirocin ointment (BACTROBAN) 2 % Apply 1 application. topically 2 (two) times daily. 08/13/21   Fenton Foy, NP  ondansetron (ZOFRAN) 4 MG tablet Take 4 mg by mouth every 8 (eight) hours as needed. 09/13/21   [provider]  ondansetron (ZOFRAN-ODT) 4 MG disintegrating tablet '4mg'$  ODT q4 hours prn nausea/vomit 09/18/21   Deno Etienne, DO  pantoprazole (PROTONIX) 40 MG tablet Take by mouth. Patient not taking: Reported on 09/13/2021 03/04/20   [provider]  phenazopyridine (PYRIDIUM) 200 MG tablet Take 1 tablet (200 mg total) by mouth 3 (three) times daily. 02/19/22   Carlisle Cater, PA-C  promethazine (PHENERGAN) 25 MG tablet Take 1 tablet (25 mg total) by mouth every 8 (eight) hours as needed for nausea or vomiting. 09/13/21   Teena Dunk, NP  tacrolimus (PROTOPIC) 0.1 % ointment Apply topically daily. 02/08/21   Sheffield, Ronalee Red, PA-C  triamcinolone ointment (KENALOG) 0.1 % Apply 1 application topically 2 (two) times daily as needed (Rash). Apply to face twice a day x 7 days 02/08/21   Warren Danes, PA-C      Allergies    Cephalexin and Keflex [cephalexin]    Review of Systems   Review of Systems Negative except as per HPI.  Physical Exam Updated Vital Signs BP (!) 116/96 (BP Location: Right Arm)   Pulse 98   Temp 98.8 F (37.1 C) (Oral)   Resp 18   LMP 04/05/2022   SpO2 100%  Physical Exam Vitals and nursing note reviewed.   Constitutional:      Appearance: Normal appearance.  HENT:     Head: Normocephalic and atraumatic.     Mouth/Throat:     Mouth: Mucous membranes are moist.  Eyes:     General: No scleral icterus. Cardiovascular:     Rate and Rhythm: Normal rate and regular rhythm.     Pulses: Normal pulses.     Heart sounds: Normal heart sounds.  Pulmonary:     Effort: Pulmonary effort is normal.     Breath sounds: Normal breath sounds.  Abdominal:     General: Abdomen is flat.     Palpations: Abdomen is soft.     Tenderness: There is no abdominal tenderness.  Musculoskeletal:        General: No deformity.  Skin:    General: Skin is warm.     Findings: No rash.  Neurological:     General: No focal deficit present.     Mental Status: She is alert.  Psychiatric:        Mood and Affect: Mood normal.     ED Results / Procedures / Treatments   Labs (all labs ordered are listed, but only abnormal results are displayed) Labs Reviewed  RESP PANEL BY RT-PCR (RSV, FLU A&B, COVID)  RVPGX2 - Abnormal; Notable for the following components:      Result Value   SARS Coronavirus 2 by RT PCR POSITIVE (*)    Influenza A by PCR POSITIVE (*)    All other components within normal limits    EKG None  Radiology DG Chest 2 View  Result Date: 04/23/2022 CLINICAL DATA:  Cough EXAM: CHEST - 2 VIEW COMPARISON:  09/18/2021 FINDINGS: The heart size and mediastinal contours are within normal limits. Both lungs are clear. The visualized skeletal structures are unremarkable. IMPRESSION: No active cardiopulmonary disease. Electronically Signed   By: Davina Poke D.O.   On: 04/23/2022 12:54    Procedures Procedures    Medications Ordered in ED Medications - No data to display  ED Course/ Medical Decision Making/ A&P                           Medical Decision Making Amount and/or Complexity of Data Reviewed Radiology: ordered.  Risk Prescription drug management.   This patient presents to the  ED for cough, sore throat, body aches, shortness of breath this involves an extensive number of treatment options, and is a complaint that carries with a high risk of complications and morbidity.  The differential diagnosis includes flu, COVID, RSV, strep, pharyngitis, bronchitis, pneumonia, infectious etiology.  This is not an exhaustive list.  Lab tests: I ordered and personally interpreted labs.  The pertinent results include: Viral panel positive for flu and  Covid.  Imaging studies:  Problem list/ ED course/ Critical interventions/ Medical management: HPI: See above Vital signs within normal range and stable throughout visit. Laboratory/imaging studies significant for: See above. On physical examination, patient is afebrile and appears in no acute distress. This patient presents with symptoms suspicious for flu and Covid. Based on history and physical doubt sinusitis. COVID and flu test was positive. Do not suspect underlying cardiopulmonary process. I considered, but think unlikely, pneumonia. Patient is nontoxic appearing and not in need of emergent medical intervention. Patient told to self isolate at home until symptoms subside for 72 hours.  Recommended patient to take TheraFlu or Mucinex for symptom relief.  I sent an Rx of Zofran, Tessalon Perles for symptom relief.  Advised patient to follow-up with primary care physician for further evaluation and management.  Return to the ER if new or worsening symptoms.  I have reviewed the patient home medicines and have made adjustments as needed.  Cardiac monitoring/EKG: The patient was maintained on a cardiac monitor.  I personally reviewed and interpreted the cardiac monitor which showed an underlying rhythm of: sinus rhythm.  Additional history obtained: External records from outside source obtained and reviewed including: Chart review including previous notes, labs, imaging.  Consultations obtained:  Disposition Continued outpatient  therapy. Follow-up with PCP recommended for reevaluation of symptoms. Treatment plan discussed with patient.  Pt acknowledged understanding was agreeable to the plan. Worrisome signs and symptoms were discussed with patient, and patient acknowledged understanding to return to the ED if they noticed these signs and symptoms. Patient was stable upon discharge.   This chart was dictated using voice recognition software.  Despite best efforts to proofread,  errors can occur which can change the documentation meaning.          Final Clinical Impression(s) / ED Diagnoses Final diagnoses:  Flu  COVID    Rx / DC Orders ED Discharge Orders          Ordered    ondansetron (ZOFRAN) 4 MG tablet  Every 6 hours        04/23/22 1553    benzonatate (TESSALON) 100 MG capsule  Every 8 hours        04/23/22 1553    albuterol (VENTOLIN HFA) 108 (90 Base) MCG/ACT inhaler  Every 6 hours PRN        04/23/22 1553              Rex Kras, PA 04/23/22 1603    Fransico Meadow, MD 04/28/22 [phone]

## 2022-04-23 NOTE — Discharge Instructions (Signed)
Please take your medications as prescribed. Take tylenol/ibuprofen for fever and pain. I recommend close follow-up with PCP for reevaluation.  Please do not hesitate to return to emergency department if worrisome signs symptoms we discussed become apparent.

## 2022-05-01 ENCOUNTER — Encounter: Payer: Self-pay | Admitting: Nurse Practitioner

## 2022-05-01 ENCOUNTER — Ambulatory Visit (INDEPENDENT_AMBULATORY_CARE_PROVIDER_SITE_OTHER): Payer: Medicaid Other | Admitting: Nurse Practitioner

## 2022-05-01 VITALS — BP 107/65 | HR 82 | Temp 97.5°F | Ht 65.0 in | Wt 217.2 lb

## 2022-05-01 DIAGNOSIS — Z09 Encounter for follow-up examination after completed treatment for conditions other than malignant neoplasm: Secondary | ICD-10-CM

## 2022-05-01 DIAGNOSIS — U071 COVID-19: Secondary | ICD-10-CM | POA: Diagnosis not present

## 2022-05-01 DIAGNOSIS — A0839 Other viral enteritis: Secondary | ICD-10-CM

## 2022-05-01 DIAGNOSIS — J101 Influenza due to other identified influenza virus with other respiratory manifestations: Secondary | ICD-10-CM | POA: Diagnosis not present

## 2022-05-01 DIAGNOSIS — R0981 Nasal congestion: Secondary | ICD-10-CM | POA: Insufficient documentation

## 2022-05-01 DIAGNOSIS — F329 Major depressive disorder, single episode, unspecified: Secondary | ICD-10-CM

## 2022-05-01 DIAGNOSIS — E611 Iron deficiency: Secondary | ICD-10-CM | POA: Diagnosis not present

## 2022-05-01 DIAGNOSIS — R5383 Other fatigue: Secondary | ICD-10-CM | POA: Diagnosis not present

## 2022-05-01 MED ORDER — FLUTICASONE PROPIONATE 50 MCG/ACT NA SUSP: 2.0000 | Freq: Every day | NASAL | 6 refills | Status: AC

## 2022-05-01 MED ORDER — LOPERAMIDE HCL 2 MG PO TABS: 2.0000 mg | ORAL_TABLET | Freq: Four times a day (QID) | ORAL | 0 refills | Status: AC | PRN

## 2022-05-01 MED ORDER — ALBUTEROL SULFATE HFA 108 (90 BASE) MCG/ACT IN AERS: 1.0000 | INHALATION_SPRAY | Freq: Four times a day (QID) | RESPIRATORY_TRACT | 0 refills | Status: AC | PRN

## 2022-05-01 NOTE — Assessment & Plan Note (Signed)
Patient encouraged to consider getting her flu vaccine and COVID vaccine once she has fully recovered  Drink at least 64 ounces of water daily to maintain hydration Take OTC Tylenol as needed for fever chills body aches  - loperamide (IMODIUM A-D) 2 MG tablet; Take 1 tablet (2 mg total) by mouth 4 (four) times daily as needed for diarrhea or loose stools.  Dispense: 30 tablet; Refill: 0 - CBC - Basic Metabolic Panel  - fluticasone (FLONASE) 50 MCG/ACT nasal spray; Place 2 sprays into both nostrils daily for nasal congestion dispense: 16 g; Refill: 6 .  Albuterol inhaler 1 to 2 puffs every 6 hours as needed for wheezing and shortness of breath.

## 2022-05-01 NOTE — Assessment & Plan Note (Signed)
1. Diarrhea due to COVID-19  - loperamide (IMODIUM A-D) 2 MG tablet; Take 1 tablet (2 mg total) by mouth 4 (four) times daily as needed for diarrhea or loose stools.  Dispense: 30 tablet; Refill: 0 - CBC - Basic Metabolic Panel  Drink at least 64 ounces of water daily to maintain hydration.  Increase intake of fiber

## 2022-05-01 NOTE — Assessment & Plan Note (Signed)
Flonase nasal spray 2 spray into each nares daily for congestion

## 2022-05-01 NOTE — Patient Instructions (Addendum)
1. Diarrhea due to COVID-19  - loperamide (IMODIUM A-D) 2 MG tablet; Take 1 tablet (2 mg total) by mouth 4 (four) times daily as needed for diarrhea or loose stools.  Dispense: 30 tablet; Refill: 0 - CBC - Basic Metabolic Panel    . Nasal congestion  - fluticasone (FLONASE) 50 MCG/ACT nasal spray; Place 2 sprays into both nostrils daily.  Dispense: 16 g; Refill: 6     Please consider getting your flu vaccine and COVID vaccine once you have fully recovered    It is important that you exercise regularly at least 30 minutes 5 times a week as tolerated  Think about what you will eat, plan ahead. Choose " clean, green, fresh or frozen" over canned, processed or packaged foods which are more sugary, salty and fatty. 70 to 75% of food eaten should be vegetables and fruit. Three meals at set times with snacks allowed between meals, but they must be fruit or vegetables. Aim to eat over a 12 hour period , example 7 am to 7 pm, and STOP after  your last meal of the day. Drink water,generally about 64 ounces per day, no other drink is as healthy. Fruit juice is best enjoyed in a healthy way, by EATING the fruit.  Thanks for choosing [facility] we consider it a privelige to serve you.

## 2022-05-01 NOTE — Assessment & Plan Note (Signed)
ollow-up to the  emergency room for COVID and flu infection on 04/23/2022.  Patient stated that she has had COVID-19 infection 3 times in the past 3 months.  She got 1 COVID-vaccine during the pandemic, she is not up-to-date with flu vaccine.  Patient states that she still feels short of breath, nasal congestion, has loose of taste sensation, body aches, nausea vomiting,watery diarrhea, abdominal cramps, pain in her throat with swallowing.  Has chills on and off and body aches.  She has been taking Tylenol as needed.  Also using albuterol inhaler as needed for shortness of breath, Zofran as needed for nausea or vomiting.  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

## 2022-05-01 NOTE — Progress Notes (Signed)
Established Patient Office Visit  Subjective:  Patient ID: Rhonda Mcgee, female    DOB: 10-Feb-1986  Age: 37 y.o. MRN: [id number]  CC:  Chief Complaint  Patient presents with   Lupus    Follow up    HPI Rhonda Mcgee is a 37 y.o. female with past medical history of GERD, NSAID induced gastritis, genital herpes lupus presents for follow-up to the emergency room for COVID and flu infection on 04/23/2022.  Patient stated that she has had COVID-19 infection 3 times in the past 3 months.  She got 1 COVID-vaccine during the pandemic, she is not up-to-date with flu vaccine.  Patient states that she still feels short of breath, nasal congestion, has loose of taste sensation, body aches, nausea vomiting,watery diarrhea, abdominal cramps, pain in her throat with swallowing.  Has chills on and off and body aches.  She has been taking Tylenol as needed.  Also using albuterol inhaler as needed for shortness of breath, Zofran as needed for nausea or vomiting.  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  Has depression due to her recent COVID-65 and flu infection she denies SI, HI        Past Medical History:  Diagnosis Date   Chlamydia    GERD (gastroesophageal reflux disease)    Gonorrhea    Herpes genitalia    Lupus ([place])    Rheumatoid arthritis(714.0)     Past Surgical History:  Procedure Laterality Date   ADENOIDECTOMY     CHOLECYSTECTOMY     DILATION AND CURETTAGE OF UTERUS     elective ab   GALLBLADDER SURGERY     TONSILLECTOMY     TUBAL LIGATION      Family History  Adopted: Yes  Problem Relation Age of Onset   Diabetes Mother    Hypertension Mother    Diabetes Sister    Hypertension Sister    Migraines Sister        with menstrual cycle, had vision loss with migraines   Lupus Maternal Grandmother     Esophageal cancer Neg Hx    Rectal cancer Neg Hx    Stomach cancer Neg Hx    Colon cancer Neg Hx     Social History   Socioeconomic History   Marital status: Single    Spouse name: Not on file   Number of children: 2   Years of education: Not on file   Highest education level: Some college, no degree  Occupational History   Not on file  Tobacco Use   Smoking status: Former    Packs/day: 0.50    Years: 10.00    Total pack years: 5.00    Types: Cigarettes    Quit date: 02/2021    Years since quitting: 1.1   Smokeless tobacco: Never  Vaping Use   Vaping Use: Every day   Substances: Nicotine, Flavoring  Substance and Sexual Activity   Alcohol use: Yes    Comment: occasional   Drug use: No   Sexual activity: Yes    Birth control/protection: None, Surgical    Comment: tubal ligation  Other Topics Concern   Not on file  Social History Narrative   Lives at home with her children   Right handed   Caffeine: 32 oz sweet tea/day   Social  Determinants of Health   Financial Resource Strain: Not on file  Food Insecurity: Not on file  Transportation Needs: Not on file  Physical Activity: Not on file  Stress: Not on file  Social Connections: Not on file  Intimate Partner Violence: Not on file    Outpatient Medications Prior to Visit  Medication Sig Dispense Refill   benzonatate (TESSALON) 100 MG capsule Take 1 capsule (100 mg total) by mouth every 8 (eight) hours. 21 capsule 0   hydroxychloroquine (PLAQUENIL) 200 MG tablet Take 2 tablets (400 mg total) by mouth daily. 180 tablet 1   ondansetron (ZOFRAN) 4 MG tablet Take 4 mg by mouth every 8 (eight) hours as needed.     pantoprazole (PROTONIX) 40 MG tablet Take by mouth.     albuterol (PROAIR HFA) 108 (90 Base) MCG/ACT inhaler Inhale 1-2 puffs into the lungs every 6 (six) hours as needed for wheezing or shortness of breath. 8 g 1   clindamycin (CLEOCIN) 300 MG capsule Take 1 capsule (300 mg total) by mouth in the morning  and at bedtime. (Patient not taking: Reported on 05/01/2022) 7 capsule 0   DUPIXENT 300 MG/2ML SOPN Inject into the skin. (Patient not taking: Reported on 05/01/2022)     ferrous sulfate 325 (65 FE) MG tablet Take 1 tablet (325 mg total) by mouth daily. (Patient not taking: Reported on 05/01/2022) 30 tablet 3   ketoconazole (NIZORAL) 2 % shampoo Apply topically daily. (Patient not taking: Reported on 09/13/2021)     mupirocin ointment (BACTROBAN) 2 % Apply 1 application. topically 2 (two) times daily. (Patient not taking: Reported on 05/01/2022) 22 g 0   phenazopyridine (PYRIDIUM) 200 MG tablet Take 1 tablet (200 mg total) by mouth 3 (three) times daily. (Patient not taking: Reported on 05/01/2022) 6 tablet 0   promethazine (PHENERGAN) 25 MG tablet Take 1 tablet (25 mg total) by mouth every 8 (eight) hours as needed for nausea or vomiting. (Patient not taking: Reported on 05/01/2022) 20 tablet 0   tacrolimus (PROTOPIC) 0.1 % ointment Apply topically daily. (Patient not taking: Reported on 05/01/2022) 100 g 0   triamcinolone ointment (KENALOG) 0.1 % Apply 1 application topically 2 (two) times daily as needed (Rash). Apply to face twice a day x 7 days (Patient not taking: Reported on 05/01/2022) 30 g 0   albuterol (VENTOLIN HFA) 108 (90 Base) MCG/ACT inhaler Inhale 1-2 puffs into the lungs every 6 (six) hours as needed for wheezing or shortness of breath. 8 g 0   ondansetron (ZOFRAN) 4 MG tablet Take 1 tablet (4 mg total) by mouth every 6 (six) hours. 12 tablet 0   ondansetron (ZOFRAN-ODT) 4 MG disintegrating tablet '4mg'$  ODT q4 hours prn nausea/vomit 20 tablet 0   No facility-administered medications prior to visit.    Allergies  Allergen Reactions   Cephalexin Hives   Keflex [Cephalexin] Hives    ROS Review of Systems  Constitutional:  Positive for chills and fever.  HENT:  Positive for congestion, rhinorrhea and trouble swallowing.   Respiratory:  Positive for cough and shortness of breath. Negative for  choking, chest tightness, wheezing and stridor.   Cardiovascular:  Negative for chest pain, palpitations and leg swelling.  Gastrointestinal:  Positive for diarrhea, nausea and vomiting. Negative for abdominal distention, abdominal pain, anal bleeding, blood in stool and constipation.  Psychiatric/Behavioral:  Negative for agitation, behavioral problems, confusion, decreased concentration, self-injury and suicidal ideas.       Objective:    Physical Exam Constitutional:  General: She is not in acute distress.    Appearance: She is obese. She is not ill-appearing, toxic-appearing or diaphoretic.  HENT:     Right Ear: Tympanic membrane, ear canal and external ear normal. There is no impacted cerumen.     Left Ear: Tympanic membrane, ear canal and external ear normal. There is no impacted cerumen.     Nose: Congestion and rhinorrhea present.     Mouth/Throat:     Mouth: Mucous membranes are moist.     Pharynx: Oropharynx is clear. No oropharyngeal exudate or posterior oropharyngeal erythema.  Eyes:     Extraocular Movements: Extraocular movements intact.  Cardiovascular:     Rate and Rhythm: Normal rate and regular rhythm.     Pulses: Normal pulses.     Heart sounds: Normal heart sounds. No murmur heard.    No friction rub. No gallop.  Pulmonary:     Effort: No respiratory distress.     Breath sounds: No stridor. No wheezing, rhonchi or rales.  Chest:     Chest wall: No tenderness.  Abdominal:     General: There is no distension.     Palpations: There is no mass.     Tenderness: There is no abdominal tenderness. There is no guarding.  Musculoskeletal:     Right lower leg: No edema.     Left lower leg: No edema.  Skin:    General: Skin is warm and dry.  Neurological:     Mental Status: She is alert and oriented to person, place, and time.     Sensory: No sensory deficit.     Motor: No weakness.     Gait: Gait normal.  Psychiatric:        Mood and Affect: Mood normal.         Behavior: Behavior normal.        Thought Content: Thought content normal.        Judgment: Judgment normal.     BP 107/65   Pulse 82   Temp (!) 97.5 F (36.4 C)   Ht '5\' 5"'$  (1.651 m)   Wt 217 lb 3.2 oz (98.5 kg)   LMP 04/05/2022   SpO2 100%   BMI 36.14 kg/m  Wt Readings from Last 3 Encounters:  05/01/22 217 lb 3.2 oz (98.5 kg)  03/31/22 195 lb (88.5 kg)  02/19/22 190 lb (86.2 kg)    Lab Results  Component Value Date   TSH 0.249 (L) 11/22/2020   Lab Results  Component Value Date   WBC 3.4 (L) 01/15/2022   HGB 12.8 01/15/2022   HCT 39.8 01/15/2022   MCV 80.9 01/15/2022   PLT 302 01/15/2022   Lab Results  Component Value Date   NA 136 01/15/2022   K 3.6 01/15/2022   CO2 22 01/15/2022   GLUCOSE 79 01/15/2022   BUN 8 01/15/2022   CREATININE 0.79 01/15/2022   BILITOT 1.0 01/15/2022   ALKPHOS 76 01/15/2022   AST 21 01/15/2022   ALT 15 01/15/2022   PROT 7.9 01/15/2022   ALBUMIN 3.9 01/15/2022   CALCIUM 8.6 (L) 01/15/2022   ANIONGAP 6 01/15/2022   No results found for: "CHOL" No results found for: "HDL" No results found for: "LDLCALC" No results found for: "TRIG" No results found for: "CHOLHDL" Lab Results  Component Value Date   HGBA1C 5.2 12/03/2018      Assessment & Plan:   Problem List Items Addressed This Visit       Respiratory  Influenza A    Patient encouraged to consider getting her flu vaccine and COVID vaccine once she has fully recovered  Drink at least 64 ounces of water daily to maintain hydration Take OTC Tylenol as needed for fever chills body aches  - loperamide (IMODIUM A-D) 2 MG tablet; Take 1 tablet (2 mg total) by mouth 4 (four) times daily as needed for diarrhea or loose stools.  Dispense: 30 tablet; Refill: 0 - CBC - Basic Metabolic Panel  - fluticasone (FLONASE) 50 MCG/ACT nasal spray; Place 2 sprays into both nostrils daily for nasal congestion dispense: 16 g; Refill: 6 .  Albuterol inhaler 1 to 2 puffs every 6  hours as needed for wheezing and shortness of breath.      Relevant Medications   albuterol (VENTOLIN HFA) 108 (90 Base) MCG/ACT inhaler     Digestive   Diarrhea due to COVID-19 - Primary    1. Diarrhea due to COVID-19  - loperamide (IMODIUM A-D) 2 MG tablet; Take 1 tablet (2 mg total) by mouth 4 (four) times daily as needed for diarrhea or loose stools.  Dispense: 30 tablet; Refill: 0 - CBC - Basic Metabolic Panel  Drink at least 64 ounces of water daily to maintain hydration.  Increase intake of fiber      Relevant Medications   loperamide (IMODIUM A-D) 2 MG tablet   Other Relevant Orders   CBC   Basic Metabolic Panel     Other   COVID-19 virus infection    Patient encouraged to consider getting her flu vaccine and COVID vaccine once she has fully recovered  Drink at least 64 ounces of water daily to maintain hydration Take OTC Tylenol as needed for fever chills body aches  - loperamide (IMODIUM A-D) 2 MG tablet; Take 1 tablet (2 mg total) by mouth 4 (four) times daily as needed for diarrhea or loose stools.  Dispense: 30 tablet; Refill: 0 - CBC - Basic Metabolic Panel  - fluticasone (FLONASE) 50 MCG/ACT nasal spray; Place 2 sprays into both nostrils daily for nasal congestion dispense: 16 g; Refill: 6 .  Albuterol inhaler 1 to 2 puffs every 6 hours as needed for wheezing and shortness of breath.      Relevant Medications   albuterol (VENTOLIN HFA) 108 (90 Base) MCG/ACT inhaler   Reactive depression    Due to her recent COVID-19 and influenza infection She denies SI, HI Will follow-up on this in 2 months, refused medication and referral for counseling    05/01/2022   11:24 AM 09/13/2021    8:49 AM 08/13/2021   11:36 AM  PHQ9 SCORE ONLY  PHQ-9 Total Score 23 0 0         Nasal congestion    Flonase nasal spray 2 spray into each nares daily for congestion      Relevant Medications   fluticasone (FLONASE) 50 MCG/ACT nasal spray   Encounter for examination  following treatment at hospital    ollow-up to the  emergency room for COVID and flu infection on 04/23/2022.  Patient stated that she has had COVID-19 infection 3 times in the past 3 months.  She got 1 COVID-vaccine during the pandemic, she is not up-to-date with flu vaccine.  Patient states that she still feels short of breath, nasal congestion, has loose of taste sensation, body aches, nausea vomiting,watery diarrhea, abdominal cramps, pain in her throat with swallowing.  Has chills on and off and body aches.  She has been taking Tylenol as  needed.  Also using albuterol inhaler as needed for shortness of breath, Zofran as needed for nausea or vomiting.  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       Meds ordered this encounter  Medications   loperamide (IMODIUM A-D) 2 MG tablet    Sig: Take 1 tablet (2 mg total) by mouth 4 (four) times daily as needed for diarrhea or loose stools.    Dispense:  30 tablet    Refill:  0   albuterol (VENTOLIN HFA) 108 (90 Base) MCG/ACT inhaler    Sig: Inhale 1-2 puffs into the lungs every 6 (six) hours as needed for wheezing or shortness of breath.    Dispense:  8 g    Refill:  0   fluticasone (FLONASE) 50 MCG/ACT nasal spray    Sig: Place 2 sprays into both nostrils daily.    Dispense:  16 g    Refill:  6    Follow-up: Return in about 2 months (around 06/30/2022) for CPE.    Renee Rival, FNP

## 2022-05-01 NOTE — Assessment & Plan Note (Signed)
Due to her recent COVID-19 and influenza infection She denies SI, HI Will follow-up on this in 2 months, refused medication and referral for counseling    05/01/2022   11:24 AM 09/13/2021    8:49 AM 08/13/2021   11:36 AM  PHQ9 SCORE ONLY  PHQ-9 Total Score 23 0 0

## 2022-05-02 LAB — CBC
Hematocrit: 37.5 % (ref 34.0–46.6)
Hemoglobin: 11.5 g/dL (ref 11.1–15.9)
MCH: 23.2 pg — ABNORMAL LOW (ref 26.6–33.0)
MCHC: 30.7 g/dL — ABNORMAL LOW (ref 31.5–35.7)
MCV: 76 fL — ABNORMAL LOW (ref 79–97)
Platelets: 378 10*3/uL (ref 150–450)
RBC: 4.96 x10E6/uL (ref 3.77–5.28)
RDW: 16.3 % — ABNORMAL HIGH (ref 11.7–15.4)
WBC: 5.5 10*3/uL (ref 3.4–10.8)

## 2022-05-02 LAB — BASIC METABOLIC PANEL
BUN/Creatinine Ratio: 7 — ABNORMAL LOW (ref 9–23)
BUN: 6 mg/dL (ref 6–20)
CO2: 20 mmol/L (ref 20–29)
Calcium: 9.1 mg/dL (ref 8.7–10.2)
Chloride: 101 mmol/L (ref 96–106)
Creatinine, Ser: 0.9 mg/dL (ref 0.57–1.00)
Glucose: 74 mg/dL (ref 70–99)
Potassium: 4.5 mmol/L (ref 3.5–5.2)
Sodium: 136 mmol/L (ref 134–144)
eGFR: 85 mL/min/{1.73_m2} (ref >59)

## 2022-05-03 ENCOUNTER — Other Ambulatory Visit: Payer: Self-pay | Admitting: Nurse Practitioner

## 2022-05-03 DIAGNOSIS — E611 Iron deficiency: Secondary | ICD-10-CM

## 2022-05-03 DIAGNOSIS — R5383 Other fatigue: Secondary | ICD-10-CM

## 2022-05-04 LAB — B12 AND FOLATE PANEL
Folate: 9.3 ng/mL (ref >3.0)
Vitamin B-12: 715 pg/mL (ref 232–1245)

## 2022-05-04 LAB — IRON,TIBC AND FERRITIN PANEL
Ferritin: 21 ng/mL (ref 15–150)
Iron Saturation: 7 % — CL (ref 15–55)
Iron: 32 ug/dL (ref 27–159)
Total Iron Binding Capacity: 483 ug/dL — ABNORMAL HIGH (ref 250–450)
UIBC: 451 ug/dL — ABNORMAL HIGH (ref 131–425)

## 2022-05-04 LAB — SPECIMEN STATUS REPORT

## 2022-05-06 ENCOUNTER — Other Ambulatory Visit: Payer: Self-pay | Admitting: Nurse Practitioner

## 2022-05-06 DIAGNOSIS — E611 Iron deficiency: Secondary | ICD-10-CM

## 2022-05-06 MED ORDER — FERROUS SULFATE 325 (65 FE) MG PO TABS: 325.0000 mg | ORAL_TABLET | Freq: Every day | ORAL | 3 refills | Status: AC

## 2022-05-06 NOTE — Progress Notes (Signed)
Iron deficiency anemia.  Patient should start taking ferrous sulfate 324 mg once daily.  eat foods rich in iron including iron-fortified bread and breakfast cereal.  nuts and seeds.  dried fruit.  wholemeal pasta and bread.  legumes - such as mixed beans, baked beans, lentils and chickpeas.  dark leafy green vegetables - such as spinach, silver beet and broccoli. Other labs are normal.

## 2022-05-07 ENCOUNTER — Telehealth: Payer: Self-pay | Admitting: Internal Medicine

## 2022-05-07 NOTE — Telephone Encounter (Signed)
Patient called the office stating she needed to schedule an appointment and needed a refill of PLQ. Patient states she has been taking her PLQ but has been getting it from a friend because she has been homeless for a while. Patient states she has been officially out of medication for a few days.   Advised patient that since she hasn't been seen in a while and by our records she should've been out of medication a while ago they Dr. Benjamine Mola most likely wont refill before her appointment. Patient was advised that it would be up to the discretion of the doctor. Patient expressed understanding.   Patient requested a message be sent to Dr. Benjamine Mola letting him know about her situation so she might be able to get a refill. Please advise.

## 2022-05-07 NOTE — Telephone Encounter (Signed)
Please advise. Patient has not been in the office since February 2023 and does not have an eye exam on file.

## 2022-05-09 NOTE — Telephone Encounter (Signed)
I will need to see her for follow up before sending additional refills for the hydroxychloroquine.

## 2022-05-10 NOTE — Telephone Encounter (Signed)
Patient advised we will need to see her for follow up before sending additional refills for the hydroxychloroquine. Patent expressed understanding. Patient is scheduled for 06/05/2022

## 2022-06-05 ENCOUNTER — Ambulatory Visit: Payer: Medicaid Other | Attending: Internal Medicine | Admitting: Internal Medicine

## 2022-06-05 ENCOUNTER — Encounter: Payer: Self-pay | Admitting: Internal Medicine

## 2022-06-05 VITALS — BP 117/81 | HR 71 | Resp 16 | Ht 65.0 in | Wt 224.0 lb

## 2022-06-05 DIAGNOSIS — H16223 Keratoconjunctivitis sicca, not specified as Sjogren's, bilateral: Secondary | ICD-10-CM | POA: Diagnosis not present

## 2022-06-05 DIAGNOSIS — H01129 Discoid lupus erythematosus of unspecified eye, unspecified eyelid: Secondary | ICD-10-CM | POA: Diagnosis not present

## 2022-06-05 DIAGNOSIS — M329 Systemic lupus erythematosus, unspecified: Secondary | ICD-10-CM | POA: Diagnosis not present

## 2022-06-05 DIAGNOSIS — R609 Edema, unspecified: Secondary | ICD-10-CM | POA: Diagnosis not present

## 2022-06-05 DIAGNOSIS — Z79899 Other long term (current) drug therapy: Secondary | ICD-10-CM

## 2022-06-05 MED ORDER — HYDROXYCHLOROQUINE SULFATE 200 MG PO TABS: 400.0000 mg | ORAL_TABLET | Freq: Every day | ORAL | 1 refills | Status: AC

## 2022-06-05 NOTE — Progress Notes (Signed)
Office Visit Note  Patient: Rhonda Mcgee             Date of Birth: 03-Jan-1986           MRN: [id number]             PCP: Fenton Foy, NP Referring: Fenton Foy, NP Visit Date: 06/05/2022   Subjective:  Follow-up (Discuss Plaquenil refill)   History of Present Illness: Rhonda Mcgee is a 37 y.o. 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  We have not followed up for a year partly due to some lack of insurance and medical follow-up during the interval.  She was taking hydroxychloroquine at less than recommended maintenance dose due to access.  No severe flareup of skin rashes or periorbital symptoms.  Has persistent dry eyes without major complication.  She has had some joint pains no severe issues not noticing any peripheral joint swelling.  She is planned for follow-up with ophthalmology Dr. Antionette Fairy on 16th.  Previous HPI 06/20/21 Rhonda Mcgee is a 37 y.o. 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 She also saw dermatology office with [facility] trial of topical tacrolimus or triamcinolone without huge improvement some of the active rash apparently more consistent with atopic dermatitis and plan starting dupixent for this. She has concerns about the dupixent treatment about safety and side effect risks.  She is doing quite well overall despite several mild generalized complaints none specific for lupus. She has noticed some numbness affecting her hands for the past 2 months. This comes and goes sometimes related to arm position sometimes seems to be random. She does not consistently notice overnight or morning symptoms.   Previous HPI 12/19/20 Rhonda Mcgee is a 37 y.o. female here for follow up for discoid lupus after restarting hydroxychloroquine '200mg'$  daily since about 4 months ago. Over the interval she received 1 dose of IV iron for her anemia and felt improvement  in energy level and decreased pica. She caught COVID and had multiple symptoms with fever, headaches, body aches, fatigue, cough, shortness of breath, but recovered from this she does notice a mild persistent amount of nonproductive cough and some increased leg swelling.    Previous HPI 08/15/20 Rhonda Mcgee is a 37 y.o. female here for evaluation of possible systemic lupus. 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  She was previously seen with rheumatology with [facility] symptoms treated on hydroxychloroquine she describes visual changes on 400 mg dose although was able to resume 200 mg daily dose apparently without return of visual problems.  Reported disease manifestations at that time included photosensitive rashes, mucosal ulcers, and sicca syndrome.  Currently she is having some arthralgias also a significant amount of dry eye symptoms. The biggest complaint is the continued skin disease especially with involvement of her lower eyelids bilaterally and central face.  She is seeing ophthalmology and has had discussion of Acthar injections for the ongoing symptoms.  She does continue to smoke cigarettes.   Review of Systems  Constitutional: Negative.  Negative for fatigue.  HENT: Negative.  Negative for mouth sores and mouth dryness.   Eyes:  Positive for dryness.  Respiratory:  Positive for shortness of breath.   Cardiovascular:  Positive for chest pain. Negative for palpitations.  Gastrointestinal:  Positive for constipation. Negative for blood in stool and diarrhea.  Endocrine: Negative.  Negative for increased urination.  Genitourinary: Negative.  Negative for involuntary urination.  Musculoskeletal:  Positive for joint pain, joint pain, joint swelling, myalgias, muscle weakness, morning stiffness, muscle tenderness and myalgias. Negative for gait problem.  Skin:  Positive for rash. Negative for color change, hair loss and sensitivity  to sunlight.  Allergic/Immunologic: Negative.  Negative for susceptible to infections.  Neurological:  Negative for dizziness and headaches.  Hematological: Negative.  Negative for swollen glands.  Psychiatric/Behavioral:  Positive for depressed mood and sleep disturbance. The patient is nervous/anxious.     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 06/04/2019   Abdominal pain, epigastric 12/04/2018   GERD (gastroesophageal reflux disease) 11/10/2018   Gastritis 11/10/2018   NSAID induced gastritis 11/10/2018   Intractable nausea and vomiting 11/09/2018   Lupus (HCC)    Hypokalemia    Vomiting    Discoid lupus erythematosus of eyelid 03/16/2018   Genital herpes 11/02/2013   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   Visual disturbances 12/06/2010    Past Medical History:  Diagnosis Date   Chlamydia    GERD (gastroesophageal reflux disease)    Gonorrhea    Herpes genitalia    Lupus ([place])    Rheumatoid arthritis(714.0)     Family History  Adopted: Yes  Problem Relation Age of Onset   Diabetes Mother    Hypertension Mother    Diabetes Sister    Hypertension Sister    Migraines Sister        with menstrual cycle, had vision loss with migraines   Lupus Maternal Grandmother    Esophageal cancer Neg Hx    Rectal cancer Neg Hx    Stomach cancer Neg Hx    Colon cancer Neg Hx    Past Surgical History:  Procedure  Laterality Date   ADENOIDECTOMY     CHOLECYSTECTOMY     DILATION AND CURETTAGE OF UTERUS     elective ab   [place]     Social History   Social History Narrative   Lives at home with her children   Right handed   Caffeine: 32 oz sweet tea/day   Immunization History  Administered Date(s) Administered   PFIZER(Purple Top)SARS-COV-2 Vaccination 07/22/2019, 08/17/2019   PPD Test 06/24/2014, 07/12/2014     Objective: Vital Signs: BP 117/81 (BP Location: Left Arm, Patient Position: Sitting, Cuff Size: Normal)   Pulse 71   Resp 16   Ht '5\' 5"'$  (1.651 m)   Wt 224 lb (101.6 kg)   BMI 37.28 kg/m    Physical Exam Eyes:     Conjunctiva/sclera: Conjunctivae normal.     Comments: No periorbital swelling Hyperpigmented skin changes present around eyelids with no associated scaling or papules, wearing concealing make-up  Cardiovascular:     Rate and Rhythm: Normal rate and regular rhythm.  Pulmonary:     Effort: Pulmonary effort is normal.  Breath sounds: Normal breath sounds.  Musculoskeletal:     Comments: Trace pedal edema bilaterally  Lymphadenopathy:     Cervical: No cervical adenopathy.  Skin:    General: Skin is warm and dry.     Comments: Patchy hyperpigmented skin changes on extremities  Neurological:     Mental Status: She is alert.  Psychiatric:        Mood and Affect: Mood normal.      Musculoskeletal Exam:  Shoulders full ROM no tenderness or swelling Elbows full ROM no tenderness or swelling Wrists full ROM no tenderness or swelling Fingers full ROM no tenderness or swelling Knees full ROM no tenderness or swelling   Investigation: No additional findings.  Imaging: No results found.  Recent Labs: Lab Results  Component Value Date   WBC 5.5 05/01/2022   HGB 11.5 05/01/2022   PLT 378 05/01/2022   NA 136 05/01/2022   K 4.5 05/01/2022   CL 101 05/01/2022   CO2 20 05/01/2022   GLUCOSE 74  05/01/2022   BUN 6 05/01/2022   CREATININE 0.90 05/01/2022   BILITOT 1.0 01/15/2022   ALKPHOS 76 01/15/2022   AST 21 01/15/2022   ALT 15 01/15/2022   PROT 7.9 01/15/2022   ALBUMIN 3.9 01/15/2022   CALCIUM 9.1 05/01/2022   GFRAA 103 08/15/2020    Speciality Comments: No specialty comments available.  Procedures:  No procedures performed Allergies: Cephalexin and Keflex [cephalexin]   Assessment / Plan:     Visit Diagnoses: Discoid lupus erythematosus of eyelid, unspecified laterality - Plan: Sedimentation rate, RNP Antibody, Anti-Smith antibody, C3 and C4, Anti-DNA antibody, double-stranded, hydroxychloroquine (PLAQUENIL) 200 MG tablet  Persistent skin rash in multiple areas none appear severely inflammatory on exam today.  Will recheck laboratory markers ruling out systemic inflammatory process as detailed above.  Plan to continue hydroxychloroquine 400 mg daily for cutaneous disease.  Keratoconjunctivitis sicca of both eyes not specified as Sjogren's  Continue supportive care no particular lesions and she has upcoming follow-up with ophthalmology I recommend they could advise if needing new medicated eyedrops restarted.  High risk medication use  She has upcoming appointment scheduled with ophthalmology for retinal toxicity screening on long-term use of hydroxychloroquine.  Dependent edema  There is minimal edema present on exam not very suspicious for major disease flareup or new underlying problem.  Orders: Orders Placed This Encounter  Procedures   Sedimentation rate   RNP Antibody   Anti-Smith antibody   C3 and C4   Anti-DNA antibody, double-stranded   Meds ordered this encounter  Medications   hydroxychloroquine (PLAQUENIL) 200 MG tablet    Sig: Take 2 tablets (400 mg total) by mouth daily.    Dispense:  180 tablet    Refill:  1     Follow-Up Instructions: Return in about 6 months (around 12/04/2022) for CCLE/?SLE on HCQ f/u 48mo.   CCollier Salina  MD  Note - This record has been created using [employer]  Chart creation errors have been sought, but may not always  have been located. Such creation errors do not reflect on  the standard of medical care.

## 2022-06-06 LAB — SEDIMENTATION RATE: Sed Rate: 34 mm/h — ABNORMAL HIGH (ref 0–20)

## 2022-06-06 LAB — ANTI-SMITH ANTIBODY: ENA SM Ab Ser-aCnc: <1 AI

## 2022-06-06 LAB — C3 AND C4
C3 Complement: 155 mg/dL (ref 83–193)
C4 Complement: 23 mg/dL (ref 15–57)

## 2022-06-06 LAB — RNP ANTIBODY: Ribonucleic Protein(ENA) Antibody, IgG: <1 AI

## 2022-06-06 LAB — ANTI-DNA ANTIBODY, DOUBLE-STRANDED: ds DNA Ab: <1 IU/mL

## 2022-06-07 NOTE — Progress Notes (Signed)
Lab results look good her sedimentation rate is very slightly elevated at 34 which is the same as a year ago other markers for lupus disease activity are all negative.  I think she is fine to just continue the current hydroxychloroquine.

## 2022-06-14 DIAGNOSIS — H16223 Keratoconjunctivitis sicca, not specified as Sjogren's, bilateral: Secondary | ICD-10-CM | POA: Diagnosis not present

## 2022-06-14 DIAGNOSIS — H01125 Discoid lupus erythematosus of left lower eyelid: Secondary | ICD-10-CM | POA: Diagnosis not present

## 2022-06-14 DIAGNOSIS — H5231 Anisometropia: Secondary | ICD-10-CM | POA: Diagnosis not present

## 2022-06-14 DIAGNOSIS — R519 Headache, unspecified: Secondary | ICD-10-CM | POA: Diagnosis not present

## 2022-06-14 DIAGNOSIS — H01122 Discoid lupus erythematosus of right lower eyelid: Secondary | ICD-10-CM | POA: Diagnosis not present

## 2022-06-14 DIAGNOSIS — Z79899 Other long term (current) drug therapy: Secondary | ICD-10-CM | POA: Diagnosis not present

## 2022-07-01 ENCOUNTER — Ambulatory Visit: Payer: Self-pay | Admitting: Nurse Practitioner

## 2022-10-14 ENCOUNTER — Telehealth: Payer: Self-pay | Admitting: Internal Medicine

## 2022-10-14 DIAGNOSIS — H01129 Discoid lupus erythematosus of unspecified eye, unspecified eyelid: Secondary | ICD-10-CM

## 2022-10-14 DIAGNOSIS — H16223 Keratoconjunctivitis sicca, not specified as Sjogren's, bilateral: Secondary | ICD-10-CM

## 2022-10-14 DIAGNOSIS — M329 Systemic lupus erythematosus, unspecified: Secondary | ICD-10-CM

## 2022-10-14 DIAGNOSIS — R609 Edema, unspecified: Secondary | ICD-10-CM

## 2022-10-14 NOTE — Telephone Encounter (Unsigned)
Pt called for a referral to a different rheumatologist due to change in insurance. [employer] local

## 2022-10-15 NOTE — Telephone Encounter (Signed)
Referral placed.

## 2022-10-15 NOTE — Telephone Encounter (Signed)
Yes that would be good. Her ophthalmologist is in their system as well.

## 2022-10-15 NOTE — Addendum Note (Signed)
Addended by: Henriette Combs on: 10/15/2022 08:38 AM   Modules accepted: Orders

## 2022-12-04 ENCOUNTER — Ambulatory Visit: Payer: Medicaid Other | Admitting: Internal Medicine
